# Patient Record
Sex: Female | Born: 1960
Health system: Southern US, Community
[De-identification: ages and names within clinical notes are randomized; demographics above are authoritative.]

## PROBLEM LIST (undated history)

## (undated) DIAGNOSIS — F329 Major depressive disorder, single episode, unspecified: Secondary | ICD-10-CM

## (undated) DIAGNOSIS — F1024 Alcohol dependence with alcohol-induced mood disorder: Secondary | ICD-10-CM

## (undated) DIAGNOSIS — F32A Depression, unspecified: Secondary | ICD-10-CM

## (undated) DIAGNOSIS — R569 Unspecified convulsions: Secondary | ICD-10-CM

## (undated) DIAGNOSIS — I10 Essential (primary) hypertension: Secondary | ICD-10-CM

## (undated) DIAGNOSIS — K219 Gastro-esophageal reflux disease without esophagitis: Secondary | ICD-10-CM

## (undated) HISTORY — DX: Alcohol dependence with alcohol-induced mood disorder: F10.24

## (undated) HISTORY — DX: Gastro-esophageal reflux disease without esophagitis: K21.9

## (undated) HISTORY — PX: BACK SURGERY: SHX140

## (undated) HISTORY — PX: OTHER SURGICAL HISTORY: SHX169

## (undated) HISTORY — PX: TUBAL LIGATION: SHX77

## (undated) HISTORY — PX: ABDOMINAL HYSTERECTOMY: SHX81

## (undated) HISTORY — DX: Essential (primary) hypertension: I10

---

## 1998-11-20 ENCOUNTER — Other Ambulatory Visit: Admission: RE | Admit: 1998-11-20 | Discharge: 1998-11-20 | Payer: Self-pay | Admitting: Family Medicine

## 1999-12-06 ENCOUNTER — Other Ambulatory Visit: Admission: RE | Admit: 1999-12-06 | Discharge: 1999-12-27 | Payer: Self-pay

## 2001-03-06 ENCOUNTER — Other Ambulatory Visit: Admission: RE | Admit: 2001-03-06 | Discharge: 2001-03-06 | Payer: Self-pay | Admitting: Family Medicine

## 2002-04-23 ENCOUNTER — Other Ambulatory Visit: Admission: RE | Admit: 2002-04-23 | Discharge: 2002-04-23 | Payer: Self-pay | Admitting: Family Medicine

## 2006-09-06 ENCOUNTER — Observation Stay (HOSPITAL_COMMUNITY): Admission: EM | Admit: 2006-09-06 | Discharge: 2006-09-07 | Payer: Self-pay | Admitting: Emergency Medicine

## 2006-09-06 ENCOUNTER — Ambulatory Visit: Payer: Self-pay | Admitting: Cardiology

## 2006-09-07 ENCOUNTER — Encounter: Payer: Self-pay | Admitting: Cardiology

## 2006-09-26 ENCOUNTER — Emergency Department (HOSPITAL_COMMUNITY): Admission: EM | Admit: 2006-09-26 | Discharge: 2006-09-26 | Payer: Self-pay | Admitting: Emergency Medicine

## 2006-09-27 ENCOUNTER — Ambulatory Visit: Payer: Self-pay | Admitting: Psychiatry

## 2006-09-27 ENCOUNTER — Inpatient Hospital Stay (HOSPITAL_COMMUNITY): Admission: EM | Admit: 2006-09-27 | Discharge: 2006-10-02 | Payer: Self-pay | Admitting: Psychiatry

## 2006-10-04 ENCOUNTER — Ambulatory Visit (HOSPITAL_COMMUNITY): Payer: Self-pay | Admitting: Psychiatry

## 2006-10-05 ENCOUNTER — Ambulatory Visit (HOSPITAL_COMMUNITY): Payer: Self-pay | Admitting: Psychiatry

## 2006-10-13 ENCOUNTER — Ambulatory Visit (HOSPITAL_COMMUNITY): Payer: Self-pay | Admitting: Psychiatry

## 2006-10-23 ENCOUNTER — Ambulatory Visit (HOSPITAL_COMMUNITY): Payer: Self-pay | Admitting: Psychiatry

## 2006-10-24 ENCOUNTER — Ambulatory Visit (HOSPITAL_COMMUNITY): Payer: Self-pay | Admitting: Psychiatry

## 2006-10-30 ENCOUNTER — Inpatient Hospital Stay (HOSPITAL_COMMUNITY): Admission: RE | Admit: 2006-10-30 | Discharge: 2006-11-06 | Payer: Self-pay | Admitting: Psychiatry

## 2006-10-30 ENCOUNTER — Emergency Department (HOSPITAL_COMMUNITY): Admission: EM | Admit: 2006-10-30 | Discharge: 2006-10-30 | Payer: Self-pay | Admitting: *Deleted

## 2006-11-07 ENCOUNTER — Ambulatory Visit: Payer: Self-pay | Admitting: Psychiatry

## 2006-11-07 ENCOUNTER — Other Ambulatory Visit (HOSPITAL_COMMUNITY): Admission: RE | Admit: 2006-11-07 | Discharge: 2006-11-21 | Payer: Self-pay | Admitting: Psychiatry

## 2006-12-06 ENCOUNTER — Ambulatory Visit (HOSPITAL_COMMUNITY): Payer: Self-pay | Admitting: Psychiatry

## 2006-12-26 ENCOUNTER — Ambulatory Visit (HOSPITAL_COMMUNITY): Payer: Self-pay | Admitting: Psychiatry

## 2007-02-28 ENCOUNTER — Ambulatory Visit (HOSPITAL_COMMUNITY): Payer: Self-pay | Admitting: Psychiatry

## 2007-03-15 ENCOUNTER — Ambulatory Visit (HOSPITAL_COMMUNITY): Payer: Self-pay | Admitting: Psychiatry

## 2007-03-19 ENCOUNTER — Ambulatory Visit (HOSPITAL_COMMUNITY): Payer: Self-pay | Admitting: Psychiatry

## 2007-03-20 ENCOUNTER — Ambulatory Visit (HOSPITAL_COMMUNITY): Payer: Self-pay | Admitting: Psychiatry

## 2007-04-02 ENCOUNTER — Ambulatory Visit (HOSPITAL_COMMUNITY): Payer: Self-pay | Admitting: Psychiatry

## 2007-04-17 ENCOUNTER — Ambulatory Visit (HOSPITAL_COMMUNITY): Payer: Self-pay | Admitting: Psychiatry

## 2007-06-12 ENCOUNTER — Ambulatory Visit (HOSPITAL_COMMUNITY): Payer: Self-pay | Admitting: Psychiatry

## 2007-09-04 ENCOUNTER — Ambulatory Visit (HOSPITAL_COMMUNITY): Payer: Self-pay | Admitting: Psychiatry

## 2007-12-06 ENCOUNTER — Ambulatory Visit (HOSPITAL_COMMUNITY): Payer: Self-pay | Admitting: Psychiatry

## 2008-04-01 ENCOUNTER — Ambulatory Visit (HOSPITAL_COMMUNITY): Payer: Self-pay | Admitting: Psychiatry

## 2008-07-08 ENCOUNTER — Ambulatory Visit (HOSPITAL_COMMUNITY): Payer: Self-pay | Admitting: Psychiatry

## 2008-09-04 ENCOUNTER — Inpatient Hospital Stay (HOSPITAL_COMMUNITY): Admission: RE | Admit: 2008-09-04 | Discharge: 2008-09-07 | Payer: Self-pay | Admitting: *Deleted

## 2008-09-04 ENCOUNTER — Ambulatory Visit (HOSPITAL_COMMUNITY): Payer: Self-pay | Admitting: Psychiatry

## 2008-09-04 ENCOUNTER — Ambulatory Visit: Payer: Self-pay | Admitting: *Deleted

## 2008-09-16 ENCOUNTER — Ambulatory Visit (HOSPITAL_COMMUNITY): Payer: Self-pay | Admitting: Psychiatry

## 2008-10-09 ENCOUNTER — Ambulatory Visit (HOSPITAL_COMMUNITY): Payer: Self-pay | Admitting: Psychiatry

## 2008-11-06 ENCOUNTER — Ambulatory Visit (HOSPITAL_COMMUNITY): Payer: Self-pay | Admitting: Psychiatry

## 2009-01-06 ENCOUNTER — Ambulatory Visit (HOSPITAL_COMMUNITY): Payer: Self-pay | Admitting: Psychiatry

## 2009-03-03 ENCOUNTER — Ambulatory Visit (HOSPITAL_COMMUNITY): Payer: Self-pay | Admitting: Psychiatry

## 2009-04-08 HISTORY — PX: FOOT SURGERY: SHX648

## 2009-05-28 ENCOUNTER — Ambulatory Visit (HOSPITAL_COMMUNITY): Payer: Self-pay | Admitting: Psychiatry

## 2009-09-15 ENCOUNTER — Ambulatory Visit (HOSPITAL_COMMUNITY): Payer: Self-pay | Admitting: Psychiatry

## 2009-12-08 ENCOUNTER — Ambulatory Visit (HOSPITAL_COMMUNITY): Payer: Self-pay | Admitting: Psychiatry

## 2010-03-23 ENCOUNTER — Ambulatory Visit (HOSPITAL_COMMUNITY): Payer: Self-pay | Admitting: Psychiatry

## 2010-04-22 ENCOUNTER — Ambulatory Visit (HOSPITAL_COMMUNITY): Payer: Self-pay | Admitting: Psychiatry

## 2010-07-22 ENCOUNTER — Ambulatory Visit (HOSPITAL_COMMUNITY): Payer: Self-pay | Admitting: Psychiatry

## 2010-10-19 ENCOUNTER — Encounter (HOSPITAL_COMMUNITY): Payer: Self-pay | Admitting: Psychiatry

## 2010-10-28 ENCOUNTER — Encounter (INDEPENDENT_AMBULATORY_CARE_PROVIDER_SITE_OTHER): Payer: BC Managed Care – PPO | Admitting: Psychiatry

## 2010-10-28 DIAGNOSIS — F331 Major depressive disorder, recurrent, moderate: Secondary | ICD-10-CM

## 2010-11-22 LAB — URINALYSIS, ROUTINE W REFLEX MICROSCOPIC
Glucose, UA: NEGATIVE mg/dL
Leukocytes, UA: NEGATIVE
Protein, ur: NEGATIVE mg/dL
Specific Gravity, Urine: 1.031 — ABNORMAL HIGH (ref 1.005–1.030)
Urobilinogen, UA: 1 mg/dL (ref 0.0–1.0)

## 2010-11-22 LAB — BENZODIAZEPINE, QUANTITATIVE, URINE
Alprazolam (GC/LC/MS), ur confirm: NEGATIVE
Flurazepam GC/MS Conf: NEGATIVE
Nordiazepam GC/MS Conf: NEGATIVE
Oxazepam GC/MS Conf: NEGATIVE

## 2010-11-22 LAB — CBC
HCT: 41.1 % (ref 36.0–46.0)
MCHC: 33.1 g/dL (ref 30.0–36.0)
MCV: 81 fL (ref 78.0–100.0)
RDW: 15.6 % — ABNORMAL HIGH (ref 11.5–15.5)

## 2010-11-22 LAB — URINE MICROSCOPIC-ADD ON

## 2010-11-22 LAB — COMPREHENSIVE METABOLIC PANEL
Albumin: 4 g/dL (ref 3.5–5.2)
BUN: 17 mg/dL (ref 6–23)
Calcium: 10 mg/dL (ref 8.4–10.5)
Creatinine, Ser: 0.73 mg/dL (ref 0.4–1.2)
GFR calc Af Amer: >60 mL/min (ref >60)
Total Protein: 7.4 g/dL (ref 6.0–8.3)

## 2010-11-22 LAB — URINE DRUGS OF ABUSE SCREEN W ALC, ROUTINE (REF LAB)
Amphetamine Screen, Ur: NEGATIVE
Benzodiazepines.: POSITIVE — AB
Creatinine,U: 235.4 mg/dL
Ethyl Alcohol: <10 mg/dL (ref <10)
Propoxyphene: NEGATIVE

## 2010-11-22 LAB — TSH: TSH: 5.562 u[IU]/mL — ABNORMAL HIGH (ref 0.350–4.500)

## 2010-12-21 NOTE — Discharge Summary (Signed)
NAMECLAIRA, [name]                  ACCOUNT NO.:  [id number]   MEDICAL RECORD NO.:  [id number]          PATIENT TYPE:  IPS   LOCATION:  0300                          FACILITY:  [facility]   PHYSICIAN:  Jasmine Pang, M.D. DATE OF BIRTH:  15-Mar-1961   DATE OF ADMISSION:  09/04/2008  DATE OF DISCHARGE:  09/07/2008                               DISCHARGE SUMMARY   IDENTIFICATION:  This is a 50 year old married white female from  [place], [place] who was referred on a voluntary basis by Dr.  Lolly Mustache, her outpatient psychiatrist on September 04, 2008.   HISTORY OF PRESENT ILLNESS:  The patient states she began out crying  spells.  She has been feeling very depressed and empty if you were not  here, you would not have these problems.  She talked with the EAP at  work.  She was on Paxil, but was switched to Zoloft.  She did not feel  this helped.  Dr. Lolly Mustache suggested Celexa, but felt she needed to come  into the hospital to make this change.  Stressors include husband does  not understand my depression.  Another stressor, she keeps her  granddaughter and is sending her son to college.  The patient states she  was here 2 years ago.   PAST PSYCHIATRIC HISTORY:  See HPI.   ALCOHOL AND DRUG HISTORY:  No alcohol or drug use.   MEDICAL PROBLEMS:  No known medical conditions.   MEDICATIONS:  Klonopin, trazodone, Paxil, and Risperdal.   ALLERGIES:  ? To EAR DROPS (itchy crusty).   PHYSICAL FINDINGS:  There were no acute physical or medical problems  noted.  Her physical exam was done by our nurse practitioner.   ADMISSION LABORATORIES:  Urinalysis was negative.  TSH was 5.562.  RDW  was 15.6.  C-MET was within normal limits.   HOSPITAL COURSE:  Upon admission, the patient was started on Klonopin  0.5 mg 1-2 tablets b.i.d. p.r.n. anxiety and trazodone 50 mg p.o.  q.h.s., Paxil 60 mg daily, Risperdal 0.5 mg q.a.m. and Risperdal 1 mg  q.h.s.  On September 05, 2008, Paxil was decreased at 40  mg p.o. q.h.s. x2  days and 20 mg a day x2 days and discontinue Paxil.  Celexa 10 mg daily  for 2 days was started and Celexa 20 mg p.o. daily.  The clonazepam was  discontinued.  She was started on lorazepam 1 mg p.o. q.i.d. p.r.n.  anxiety.  In individual sessions with me, the patient was cooperative  with fair eye contact.  She did have psychomotor retardation.  Speech  was normal rate and flow.  Mood was depressed and anxious with a  consistent affect. She was quite tearful.  Anxiety level was rated at  severe and there was no evidence of psychosis or thought disorder.  On  September 05, 2008, the patient had a family session via telephone with  her husband.  Main concerns addressed were for discharge planning and  discussing the patient's medications.  Both stated that she had needed  to get her medications adjusted.  The patient  will return home and  return to her therapist, Tacy Learn at her EAP program.  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  She  identified babysitting as stressors and spoke about decreasing this as  she could have time for self on the days that she does not work.  On  September 06, 2008, the patient was not anxious since medications were  changed and was sleeping better.  She was not crying like she was when  admitted.  There was no change in her treatment plan.  On September 07, 2008, mental status had improved markedly.  The patient requested  discharge as her husband was coming for lunch she wanted to return with  him.  She has a followup appointment with Dr. Lolly Mustache and her counselor.  Mood was less depressed, less anxious.  Affect was consistent with mood.  There was no suicidal or homicidal ideation.  No thoughts of self  injurious behavior.  No auditory or visual hallucinations.  No paranoia  or delusions.  Thoughts were logical and goal-directed.  Thought content  no predominant theme.  Cognitive grossly intact.  Insight  good.  Judgment good.  Impulse control good. Psychomotor activity was within  normal limits.   DISCHARGE DIAGNOSES:  Axis I:  Mood disorder, not otherwise specified.  Axis II:  None.  Axis III:  None.  Axis IV:  Severe (son in college, keeping granddaughter, feeling like  her husband does not understand her depression, burden of psychiatric  illness).  Axis V:  Global assessment of functioning is 50 upon discharge.  GAF was  35 upon admission.  GAF highest past year was 65.   DISCHARGE PLANS:  There was no specific activity level or dietary  restrictions.   POSTHOSPITAL CARE PLANS:  The patient will see Dr. Lolly Mustache on September 16, 2008, at 10:30 a.m.  She will also see Florencia Reasons, counselor on  September 17, 2008 at 11 a.m.   DISCHARGE MEDICATIONS:  1. Celexa 20 mg h.s.  2. Risperdal 0.5 mg in the a.m. and 1 mg at bedtime.  3. Lorazepam 1 mg q.i.d. p.r.n. anxiety.      Jasmine Pang, M.D.  Electronically Signed     BHS/MEDQ  D:  09/29/2008  T:  09/30/2008  Job:  [id number]

## 2010-12-24 NOTE — Discharge Summary (Signed)
[name], [name]                  ACCOUNT NO.:  [id number]   MEDICAL RECORD NO.:  [id number]          PATIENT TYPE:  IPS   LOCATION:  0302                          FACILITY:  [facility]   PHYSICIAN:  Anselm Jungling, MD  DATE OF BIRTH:  [birth year]   DATE OF ADMISSION:  10/30/2006  DATE OF DISCHARGE:  11/06/2006                               DISCHARGE SUMMARY   IDENTIFYING DATA/REASON FOR ADMISSION:  This was the second [facility]  admission for [name], a 50 year old married female who was readmitted one  month after her prior course of treatment here.  She was admitted after  getting drunk on alcohol, having an argument with her husband, and then  taking an excessive quantity of Ambien.  She returned to [place] on a regimen  of Paxil and Klonopin.  She had also made superficial lacerations.  Please refer to the admission note for further details pertaining to the  symptoms, circumstances and history that led to her hospitalization.  Please also refer to previous admission and discharge notes.   INITIAL DIAGNOSTIC IMPRESSION:  She was given initial AXIS I diagnosis  of mood disorder not otherwise specified, and alcohol abuse, with  secondary diagnosis of AXIS II features and marital problem.   MEDICAL/LABORATORY:  The patient was medically and physically assessed  by the psychiatric nurse practitioner.  She was continued on Lopressor  12.5 mg b.i.d. for hypertension.  In addition, she was treated with  Cipro ear drops for otitis.  Otherwise, there were no acute medical  issues.   HOSPITAL COURSE:  The patient was admitted again to the inpatient  psychiatric service.  She presented as a well-nourished, normally-  developed woman who was alert and fully oriented.  She was very sad and  tearful.  There were no signs or symptoms of psychosis.  She discussed  her marital stresses.  She indicated that she was in acceptance of being  readmitted to the hospital, but otherwise appeared to be upset and  overwhelmed, so much so that she could not formulate clear ideas  regarding her treatment needs.   The patient was treated with a psychotropic regimen including Paxil 40  mg daily, Klonopin 0.5 mg q.h.s., Risperdal 0.5 mg t.i.d.  She was again  involved in various therapeutic groups and activities and was a good  participant in the treatment program.   On the third hospital day, the patient continued very depressed and  anxious, hopeless and helpless.  She was not eating very much and  sleeping poorly.  She had had contact with her husband on the telephone  the day prior and stated that it went poorly.  She was encouraged to eat  and drink, lest she become dehydrated.   The day following, she continued depressed, apathetic, and helpless in  outlook.  She stated that she was alright but looked very glum.  She  had been prescribed Klonopin, and this was discontinued in the morning  to help increase her level of daytime energy.   On the fifth hospital day, the patient continued depressed, and  hopeless.  She believed her husband would be moving out of the family  home prior to her discharge and return home.  She was unable to talk  about this, and when she attempted to do so became overcome with tears  and sobbing.  She stated that her husband had refused to visit her  because he doesn't want to give me any false hope.   In an effort to marshal all supports available, we scheduled a family  meeting involving the patient's mother.  Mother came for a family  session, and the patient's immediate and aftercare needs were discussed  at length.  There was discussion about needing time off from work, so  that she could take short-term disability.  There was also discussion  about the possibility of the patient attending the intensive outpatient  program and/or getting counseling through her employee assistance  program at work.  The patient and her mother were given information on  the crisis  hotline, and the suicide prevention pamphlet.   We discussed a change away from Paxil, because the patient had been on  it for five weeks without any apparent benefit.  She continued to report  obsessional thinking and worry, and thoughts of self-harm, that the  patient disavowed, but could not get out of her mind anyway.  Paxil was  discontinued in favor of a trial of Risperdal 0.25 mg t.i.d. and 0.5 mg  q.h.s.  She remained depressed following this, but continued less  anxious and obsessional.   By the seventh hospital day, the patient was reporting that she was less  anxious, sleeping better, and less obsessional.  She was agreeing to the  intensive outpatient program.  Risperdal was increased to 0.5 mg t.i.d.   On the final hospital day, the patient reported that she was feeling  significantly better, and ready to continue her care in the outpatient  setting.   AFTERCARE:  The patient was to follow up with Dr. Lolly Mustache in the  [facility] on November 14, 2006, and for individual counseling with  Florencia Reasons on November 08, 2006.   DISCHARGE MEDICATIONS:  1. Paxil 40 mg daily.  2. Nexium 40 mg daily.  3. Klonopin 0.5 mg q.h.s.  4. Risperdal 0.5 mg t.i.d.  5. Cipro ear drops daily.  6. Lopressor 12.5 mg b.i.d.   DISCHARGE DIAGNOSES:  AXIS I:  Major depressive disorder.  Marital  problem.  AXIS II:  Deferred.  AXIS III:  Otitis, gastroesophageal reflux disease and hypertension.  AXIS IV:  Stressors:  Severe.  AXIS V:  GAF on discharge 55.      Anselm Jungling, MD  Electronically Signed     SPB/MEDQ  D:  11/15/2006  T:  11/15/2006  Job:  [id number]

## 2010-12-24 NOTE — Consult Note (Signed)
[name], [name]                  ACCOUNT NO.:  [id number]   MEDICAL RECORD NO.:  [id number]          PATIENT TYPE:  OBV   LOCATION:  4732                         FACILITY:  [facility]   PHYSICIAN:  Kristine Garbe. Ezzard Standing, M.D.DATE OF BIRTH:  1960/12/01   DATE OF CONSULTATION:  09/06/2006  DATE OF DISCHARGE:                                 CONSULTATION   REASON FOR CONSULTATION:  Evaluate patient with right ear problems.   BRIEF HISTORY AND PHYSICAL:  The patient is a 50 year old female who is  presently admitted to [facility] because of a recent bout of  tachycardia, presyncope, and elevated blood pressure.  She has  complained of some fullness in the head as well as fullness in the right  ear.  She has a history of chronic right ear problems.  Had a right T-  tube placed over two years ago.   On examination of the ear today, there is some drainage around the right  tube.  The left TM is clear.   IMPRESSION:  History of chronic right ear problems with right ear  infection.   RECOMMENDATIONS:  Recommended placing her on Ciprodex Otic Ear Drops 4  drops twice a day for the next five days.  Would like to have her follow  up in my office when she is discharged from the hospital for further  evaluation.  Will have her call my office for a followup appointment  when discharged from the hospital.           ______________________________  Kristine Garbe. Ezzard Standing, M.D.     CEN/MEDQ  D:  09/06/2006  T:  09/06/2006  Job:  [id number]

## 2010-12-24 NOTE — H&P (Signed)
NAMESHARITA, [name] NO.:  [id number]   MEDICAL RECORD NO.:  [id number]          PATIENT TYPE:  EMS   LOCATION:  MAJO                         FACILITY:  [facility]   PHYSICIAN:  Carolyn Abed, MD, FACCDATE OF BIRTH:  [birth year]   DATE OF ADMISSION:  09/06/2006  DATE OF DISCHARGE:                              HISTORY & PHYSICAL   SUMMARY OF HISTORY:  This is a 50 year old white female who was seen by  her primary care physician's assistant, [name] and was referred to  [facility] secondary to near syncope and sinus tachycardia.   The patient states that over the last week or two she has felt  intermittent nausea and just has not felt well.  She states in the last  couple days she has noticed when she becomes nauseated she gets dizzy  and feels like she is going to pass out.  She states that she actually  vomited twice yesterday and her symptoms seem to be worse yesterday and  this morning, thus prompting her evaluation.  She also describes a head  fullness where everything seems to be in a fog.  Decreased hearing as  like she is in a swimming pool under water and everything seems somewhat  muffled.  She denies any nystagmus or falls, actual loss of  consciousness or objects spinning.  She states that she has noticed that  her heartbeat has been somewhat faster than usual and describes a  fluttering sensation.  She states that usually if she checks her  heartbeat it is in the upper 90s to low 100s.  She denies any problems  with chest discomfort, shortness of breath, dyspnea on exertion,  orthopnea, PND or edema.  She has had a slight cough that she does not  consider unusual and has not been productive.  Her husband relates  chills however the patient denies any recent fevers.   When seen by Mr. [name] this morning she was told that she has a right ear  infection however she was not placed on any antibiotics.  She also  states that she is not taking any  herbal medications, over-the-counter  antihistamines and has not missed her home medications.   PAST MEDICAL HISTORY:  Allergies include CORTISPORIN.  Medications prior  to admission include Zoloft 100 mg daily, metoprolol 25 mg b.i.d.,  Excedrin ES p.r.n. and Nexium 40 mg daily.  She has a history of  hypertension, fibromyalgia and anemia.  She states that she has had a  right ear tube placed and a T&A.  She denies diabetes, myocardial  infarction, CVA, COPD, bleeding dyscrasias, thyroid disorder.  She does  not know her cholesterol status.   SOCIAL HISTORY:  She resides in [place] with her husband.  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  She has two children.  No  grandchildren.  She denies tobacco, alcohol, drugs, herbal medication.  She tries to maintain low carb diet.  She rarely exercises however, when  she chooses to she walks without difficulty.   FAMILY HISTORY:  Mother is alive  at the age of 44 with a history of  coronary stenting and hypertension.  Father died at the age of 33 with  myocardial infarction post a bad MVA  He had a history of diabetes.  She does not have any brothers or sisters.   REVIEW OF SYSTEMS:  In addition to above is notable for right hearing  loss, glasses, postmenopausal.  Right hand numbness.  All other systems  are unremarkable.   PHYSICAL EXAM:  VITAL SIGNS:  Temperature 98.6, blood pressure 154/100, pulse 128 and  regular, respirations 22 and regular, 99% sat on 2 liters.  GENERAL:  Well-nourished, well-developed pleasant white female in no  apparent distress.  Husband is present.  HEENT: Normocephalic.  PERRLA.  Extraocular movements intact.  Sclerae  is clear.  Her right ear canal is erythematous with significant  drainage.  Left ear is slightly erythematous.  Right ear is painful  during exam.  NECK:  Supple without thyromegaly, adenopathy, JVD or carotid bruits.  CHEST:  Symmetrical excursion.  LUNGS:  Lungs were clear to  auscultation.  HEART:  PMI is not displaced.  Rapid regular rate and rhythm with normal  S1-S2.  I do not appreciate any murmurs, rubs, clicks or gallops.  All  pulses are symmetrical and intact without abdominal or femoral bruits.  SKIN AND INTEGUMENT:  Intact.  Does not appear to have any rashes.  She  does have a sore on her right forehead that she meant to ask Mr. [name]  about.  ABDOMEN:  Slightly rounded.  Bowel sounds present without organomegaly,  masses or tenderness.  EXTREMITIES:  Negative cyanosis, clubbing or edema.  Peripheral pulses  are symmetrical and intact.  MUSCULOSKELETAL:  Unremarkable.  NEUROLOGIC:  Alert and oriented x3.  Cranial nerves II-XII are grossly  intact.  She has equal strength in all extremities.   EKG in the emergency room is comparable to the EKG from primary care's  office.  This revealed sinus tachycardia, normal intervals, normal axis,  slightly delayed R-wave.  Essentially is not change from EKG in 2003  except the rate is faster.  Labs are pending.  Chest x-ray is pending.   IMPRESSION:  1. Near-syncope.  2. Chronic ear issues with possible acute right ear infection      precipitating symptoms of #1.  3. Hypertension.  4. Sinus tachycardia   DISPOSITION:  Dr. Myrtis Carrillo reviewed the patient's history, spoke with family  and patient and agrees with the above.  Given that she had a TSH on  August 29 2006 and this is within normal limits, this will not be  repeated.  We will check a 2-D echocardiogram to assess for  cardiomyopathy.  We will increase her beta blocker to 50 mg p.o. b.i.d.  I have consulted Dr. Ezzard Carrillo in regards to her above symptoms as that  they are probably related to her inner ear.  We will observe her  overnight.  Further disposition will follow post-treatment.      Carolyn Rued, PA-C      Carolyn Abed, MD, [facility]  Electronically Signed   EW/MEDQ  D:  09/06/2006  T:  09/06/2006  Job:  [id number]   cc:   [name].  [name], M.D.  [name]. [name], M.D.

## 2010-12-24 NOTE — H&P (Signed)
NAMEJOSUE, KASS                  ACCOUNT NO.:  [id number]   MEDICAL RECORD NO.:  [id number]          PATIENT TYPE:  IPS   LOCATION:  0300                          FACILITY:  [facility]   PHYSICIAN:  Anselm Jungling, MD  DATE OF BIRTH:  1961/01/29   DATE OF ADMISSION:  09/27/2006  DATE OF DISCHARGE:                       PSYCHIATRIC ADMISSION ASSESSMENT   HISTORY OF PRESENT ILLNESS:  The patient presents with depression.  Her  husband has asked her for a divorce.  They have been married for 25  years.  She is not sure quite what is going on.  She states her husband  keeps bringing up situations that happened 25 years ago.  The patient  states that she scratched her abdomen with a piece of glass to punish  herself.  Also reports overdose on 6 Zoloft tablets over the past  couple days, but vomited up the medicine.  Her husband became very upset  with her with this overdose.  She reports she has lost 40 pounds  intentionally, but she states her husband tells her still that she is  not eating right, that there could be some concerns about her gaining  weight again.   PAST PSYCHIATRIC HISTORY:  This is this patient's first Behavioral  Health admission.  No other psychiatric admissions, nor current  outpatient treatment.  She is a 49 year old married white female,  married for 25 years.  Has 2 children, ages 72 and 45.  She lives with  her husband and her 65 year old son.  Works at [employer].  She has no legal  problems.   FAMILY HISTORY:  Denies alcohol or drug history.   SOCIAL HISTORY:  Nonsmoker.  Drinks alcohol on an occasional basis.  No  drug use.   PRIMARY CARE Roderica Cathell:  Dr. Lindaann Pascal in [place], [place].   MEDICAL PROBLEMS:  Hypertension and GERD.   MEDICATIONS:  1. She has been on metoprolol 50 mg one b.i.d.  2. Nexium 40 mg daily.   DRUG ALLERGIES:  Reports an allergy to ear drops.   PHYSICAL EXAMINATION:  The patient was assessed at [facility] Emergency  Department.  Her temperature is 97.2, 94 heart rate, 18 respirations.  Blood pressure is 130/85.  Again, she was assessed at [facility]  Emergency Department, where she received potassium 40 mEq as the patient  had a potassium of 2.9.  Also received some IV piggyback potassium and  received some IV fluids.  Her laboratory data, CBC, is within normal  limits.  Again, initial potassium was 2.9, is now 3.8.  Urine drug  screen is negative.  Her alcohol level is less than 5.   MENTAL STATUS EXAMINATION:  She is fully alert, cooperative, little eye  contact.  She is casually dressed.  Her speech is clear.  The patient's  mood is hopeless.  The patient is cheerful throughout most of the  interview.  Thought processes, answers are goal directed.  Focuses on  situation with herself and her husband.  The patient relates different  scenarios about their marriage.  Cognitively intact.  Memory is good.  Judgment is fair.  Insight is fair.  Concentration is intact.  Average  intelligence.   Axis I:  Major depressive disorder, severe.  Axis II:  Deferred.  Axis III:  Hypertension and gastroesophageal reflux disease.  Axis IV:  Problems with primary support group and other psychosocial  problems.  Axis V:  Current is 35.   PLAN:  Is to contract for safety.  __________ .  We will initiate Paxil.  Risks and benefits were discussed.  Will have some Ativan available for  patient's symptoms of anxiety.  The patient may need some individual  therapy.  Will have a family session with husband.  Will also have  Ambien or trazodone available for sleep.  The patient is to increase her  coping skills of 10 groups.  __________  4 to 5 days.      Landry Corporal, N.P.      Anselm Jungling, MD  Electronically Signed    JO/MEDQ  D:  09/28/2006  T:  09/28/2006  Job:  [id number]

## 2010-12-24 NOTE — Discharge Summary (Signed)
[name], [name]                  ACCOUNT NO.:  [id number]   MEDICAL RECORD NO.:  [id number]          PATIENT TYPE:  OBV   LOCATION:  4732                         FACILITY:  [facility]   PHYSICIAN:  Luis Abed, MD, FACCDATE OF BIRTH:  1961/01/30   DATE OF ADMISSION:  09/06/2006  DATE OF DISCHARGE:  09/07/2006                               DISCHARGE SUMMARY   PROCEDURE PERFORMED DURING HOSPITALIZATION:  None.   HISTORY OF PRESENT ILLNESS:  This is a pleasant, 50 year old Caucasian  female who was admitted after being seen by her primary care Tayshon Winker,  Lindaann Pascal, [place], with patient complaints of syncope and tachycardia.  The  patient stated that, over the last week or two, she felt intermittent  nausea and had not felt well.  She had been feeling some nausea with  dizzy spells and felt like she was going to pass out.  The patient  states that she had been vomiting prior to admission.  She also noticed  fullness in her head with a foggy feeling and decreased hearing on the  right side.   By the examination by Mr. Jacqulyn Bath, she was told that she had right ear  infection, but she was not placed on antibiotics.  Secondary to the  increased symptoms, the patient was came to the emergency room for  further evaluation and treatment.   HOSPITAL COURSE:  The patient was seen and examined by Dr. Myrtis Ser and also  by Joellyn Rued PA.  The patient was admitted for further evaluation  secondary to the near syncope, dizziness, and tachycardia.  The patient  was evaluated with lab work along with EKG.  EKG, in the emergency room,  was found to be unchanged from the EKG in the primary care's office,  revealing sinus tachycardia, normal intervals, normal axes with slightly  delayed R-wave.  The patient had cardiac enzymes cycled and they were  found to be negative with a troponin of 0.01, 0.03, and 0.02  respectively.   The patient had an echocardiogram completed during hospitalization.  She  was found  to have a left ventricular ejection fraction range being 65%.  There was no ventricular wall motion abnormalities.  Overall, left  ventricular systolic function was normal.  There were no valvular  abnormalities.  It was noted that the left ventricular wall thickness  was mildly decreased per Dr. Jens Som, who read this on September 07, 2006.   Dr. Narda Bonds, ENT specialist, was consulted secondary to the  patient's complaint of right ear pain and possible infection.  Dr.  Ezzard Standing did assess the patient and she was found to have an infected  right ear.  She was started on Cipro DX otic drops, 4 drops in each ear  twice a day.  Dr. Ezzard Standing also requested a followup in his office after  the patient had been discharged.  The following day, on discharge, the  patient was felling much better.  The dizziness had subsided.  Her heart  rate remained within normal limits.  She continued on her Lopressor as  at home at 16  mg b.i.d.  The patient was feeling better and was seen and  examined by Dr. Antoine Poche and found stable for discharge.  It was noted  that Dr. Antoine Poche had recommended that a T3 and a T4 be drawn prior to  discharge with results to be discussed by Dr. Myrtis Ser on followup  appointment.  It is noted that her TSH was found to be 0.995 during this  admission.   DISCHARGE LABORATORY DATA:  Hemoglobin 14.4, hematocrit 43, white blood  cells 8, platelets 318,000.  PT 12.7, INR 0.9, PTT 24.  Sodium 148,  potassium 4.1, chloride 103, CO2 25, glucose 105, BUN 14, creatinine  0.79.  LFTs within normal limits.  Cholesterol 168, triglycerides 64,  HDL 58, LDL 97.  The patient's chest x-ray, dated September 06, 2006,  revealed no acute cardiopulmonary findings.  EKG, on the day of  discharge, revealed a normal sinus rhythm with ventricular rate at 78  beats per minute.   DISCHARGE PLANS AND APPOINTMENTS:  1. The patient will see Dr. Zackery Barefoot on November 06, 2006 at 9:30      a.m. for a  cardiology followup.  2. The patient will follow up with Dr. Narda Bonds on Monday,      September 11, 2006, for evaluation of right ear infection.  3. The patient will follow with her primary care Bronsen Serano, Lindaann Pascal,      physician assistant, and she is to call to make that appointment.  4. The patient is to ask Dr. Myrtis Ser the results of her T3 and T4      results, which were drawn prior to discharge.  5. The patient is to continue her current medications as taking at      home with the addition of ciprofloxacin ear drops with prescription      provided.   DISCHARGE MEDICATIONS:  1. Zoloft 100 mg once a day.  2. Lopressor 50 mg twice a day.  3. Ciprofloxacin ear drops, 4 drops to right ear twice a day x5 days.   ALLERGIES:  Neomycin and polymyxin B sulfate.   TIME SPENT WITH THE PATIENT:  To include physician time, 30 minutes.      Bettey Mare. Lyman Bishop, NP      Luis Abed, MD, [facility]  Electronically Signed    KML/MEDQ  D:  09/07/2006  T:  09/08/2006  Job:  [id number]   cc:   Lindaann Pascal, PA  Kristine Garbe Ezzard Standing, M.D.

## 2010-12-24 NOTE — Discharge Summary (Signed)
NAMERAYMOND, AZURE                  ACCOUNT NO.:  [id number]   MEDICAL RECORD NO.:  [id number]          PATIENT TYPE:  IPS   LOCATION:  0300                          [facility]   PHYSICIAN:  Anselm Jungling, MD  DATE OF BIRTH:  26-Mar-1961   DATE OF ADMISSION:  09/27/2006  DATE OF DISCHARGE:  10/02/2006                               DISCHARGE SUMMARY   IDENTIFYING DATA AND REASON FOR ADMISSION:  The patient is a 50 year old  married white female admitted due to increasing depression and suicidal  ideation.  Her husband had mentioned that he wanted a divorce, after 25  years of marriage.  In response to this, the patient made a suicide  gesture via overdose, and also cut herself.  She had been taking the  antidepressant Zoloft.  Please refer to the admission note for further  details pertaining to the symptoms, circumstances and history that led  to her hospitalization.  She was given an initial Axis I diagnosis of  major depressive disorder, recurrent, adjustment disorder with depressed  mood and marital problem.   MEDICAL AND LABORATORY:  The patient was medically and physically  assessed by the psychiatric nurse practitioner.  She came to [place] with a  history of hypertension and GERD.  She was continued on Lopressor 50 mg  daily and given Protonix 80 mg daily.  There were no significant medical  issues.   HOSPITAL COURSE:  The patient was admitted to the adult inpatient  psychiatric service.  She presented as a well-nourished, well-developed  woman who initially was very tired, depressed and sad, but whom denied  any active further suicidal ideation.  There were no signs or symptoms  of psychosis or thought disorder.  The patient denied drug and alcohol  abuse.  She verbalized a strong desire for help.   It was judged that Zoloft had not been an effective antidepressant for  her.  As such, it was discontinued in favor of a trial of Paxil 10 mg  daily.  Ativan was used on a  p.r.n. basis for anxiety, and later changed  to Vistaril p.r.n. anxiety with good results.   Klonopin was also used in low doses for anxiety but appeared to be  excessively sedating and was not continued.   On the fourth hospital day there was a family session involving the  patient and her husband.  Main concerns addressed were on coping skills  that the patient could use to help reduce stress at work, leave the  stresses at work, and ways to increase communication with her husband.  The patient had a referral for individual counseling and medication  management upon discharge and agreed to follow through with this.  She  also agreed to consider going to support groups for depression.  The  patient spoke about wanting to go home on that day on the husband stated  that he felt that she was getting better.  The patient spoke about a  concern with her medications, stating that she felt that they were not  strong enough.  The patient and  her husband were given information on  suicide prevention as well as the suicide prevention pamphlet and the  Crisis Hotline numbers card.   The following day, the patient reported feeling distraught and  overwhelmed.  She learned that her husband might need gallbladder  surgery and wanted to be discharged so she could be with him.   The patient was discharged on the following day, after the undersigned  had an opportunity to meet with her and determined her suitability for  discharge.  She appeared to be quite concerned about her husband but  otherwise less depressed, and absent suicidal ideation.  She was  tolerating her medications.  She agreed to the following aftercare plan.   AFTERCARE:  The patient was to follow up with Dr. Lolly Mustache for medication  management on April 10.  She was to follow up with Florencia Reasons at the  [facility] for individual psychotherapy on February 27.   DISCHARGE MEDICATIONS:  1. Paxil 10 mg daily.  2. Vistaril 50 mg  p.r.n. anxiety up to twice daily.  3. Lopressor 50 mg daily.  4. Protonix 80 mg daily.   DISCHARGE DIAGNOSES:  AXIS I:  Major depressive disorder, recurrent,  severe without psychotic features.  AXIS II:  Deferred.  AXIS III:  History of hypertension, gastroesophageal reflux disease .  AXIS IV:  Stressors severe.  AXIS V:  Global assessment of functioning on discharge 65.      Anselm Jungling, MD  Electronically Signed     SPB/MEDQ  D:  10/03/2006  T:  10/03/2006  Job:  [id number]

## 2011-01-25 ENCOUNTER — Encounter (HOSPITAL_COMMUNITY): Payer: BC Managed Care – PPO | Admitting: Psychiatry

## 2011-02-03 ENCOUNTER — Encounter (INDEPENDENT_AMBULATORY_CARE_PROVIDER_SITE_OTHER): Payer: BC Managed Care – PPO | Admitting: Psychiatry

## 2011-02-03 DIAGNOSIS — F331 Major depressive disorder, recurrent, moderate: Secondary | ICD-10-CM

## 2011-03-08 ENCOUNTER — Encounter (HOSPITAL_COMMUNITY): Payer: BC Managed Care – PPO | Admitting: Psychiatry

## 2011-03-08 ENCOUNTER — Encounter (INDEPENDENT_AMBULATORY_CARE_PROVIDER_SITE_OTHER): Payer: BC Managed Care – PPO | Admitting: Psychiatry

## 2011-03-08 DIAGNOSIS — F331 Major depressive disorder, recurrent, moderate: Secondary | ICD-10-CM

## 2011-03-17 ENCOUNTER — Ambulatory Visit (INDEPENDENT_AMBULATORY_CARE_PROVIDER_SITE_OTHER): Payer: BC Managed Care – PPO | Admitting: Psychiatry

## 2011-03-17 ENCOUNTER — Encounter (HOSPITAL_COMMUNITY): Payer: BC Managed Care – PPO | Admitting: Psychiatry

## 2011-03-17 DIAGNOSIS — F331 Major depressive disorder, recurrent, moderate: Secondary | ICD-10-CM

## 2011-03-22 ENCOUNTER — Encounter (INDEPENDENT_AMBULATORY_CARE_PROVIDER_SITE_OTHER): Payer: BC Managed Care – PPO | Admitting: Psychiatry

## 2011-03-22 ENCOUNTER — Encounter (HOSPITAL_COMMUNITY): Payer: BC Managed Care – PPO | Admitting: Psychiatry

## 2011-03-22 DIAGNOSIS — F331 Major depressive disorder, recurrent, moderate: Secondary | ICD-10-CM

## 2011-03-31 ENCOUNTER — Encounter (HOSPITAL_COMMUNITY): Payer: BC Managed Care – PPO | Admitting: Psychiatry

## 2011-04-28 ENCOUNTER — Encounter (INDEPENDENT_AMBULATORY_CARE_PROVIDER_SITE_OTHER): Payer: BC Managed Care – PPO | Admitting: Psychiatry

## 2011-04-28 DIAGNOSIS — F331 Major depressive disorder, recurrent, moderate: Secondary | ICD-10-CM

## 2011-05-04 ENCOUNTER — Ambulatory Visit: Payer: BC Managed Care – PPO | Attending: Orthopedic Surgery | Admitting: Physical Therapy

## 2011-05-04 DIAGNOSIS — M25669 Stiffness of unspecified knee, not elsewhere classified: Secondary | ICD-10-CM | POA: Insufficient documentation

## 2011-05-04 DIAGNOSIS — R5381 Other malaise: Secondary | ICD-10-CM | POA: Insufficient documentation

## 2011-05-04 DIAGNOSIS — IMO0001 Reserved for inherently not codable concepts without codable children: Secondary | ICD-10-CM | POA: Insufficient documentation

## 2011-05-04 DIAGNOSIS — M25569 Pain in unspecified knee: Secondary | ICD-10-CM | POA: Insufficient documentation

## 2011-05-06 ENCOUNTER — Ambulatory Visit: Payer: BC Managed Care – PPO | Admitting: Physical Therapy

## 2011-05-10 ENCOUNTER — Ambulatory Visit: Payer: BC Managed Care – PPO | Attending: Orthopedic Surgery | Admitting: Physical Therapy

## 2011-05-10 DIAGNOSIS — R5381 Other malaise: Secondary | ICD-10-CM | POA: Insufficient documentation

## 2011-05-10 DIAGNOSIS — M25669 Stiffness of unspecified knee, not elsewhere classified: Secondary | ICD-10-CM | POA: Insufficient documentation

## 2011-05-10 DIAGNOSIS — IMO0001 Reserved for inherently not codable concepts without codable children: Secondary | ICD-10-CM | POA: Insufficient documentation

## 2011-05-10 DIAGNOSIS — M25569 Pain in unspecified knee: Secondary | ICD-10-CM | POA: Insufficient documentation

## 2011-05-13 ENCOUNTER — Ambulatory Visit: Payer: BC Managed Care – PPO | Admitting: Physical Therapy

## 2011-05-16 ENCOUNTER — Ambulatory Visit: Payer: BC Managed Care – PPO | Admitting: Physical Therapy

## 2011-05-19 ENCOUNTER — Encounter: Payer: BC Managed Care – PPO | Admitting: Physical Therapy

## 2011-05-23 ENCOUNTER — Emergency Department (HOSPITAL_COMMUNITY)
Admission: EM | Admit: 2011-05-23 | Discharge: 2011-05-23 | Disposition: A | Discharge status: Home / Self Care | Payer: BC Managed Care – PPO | Attending: Emergency Medicine | Admitting: Emergency Medicine

## 2011-05-23 ENCOUNTER — Emergency Department (HOSPITAL_COMMUNITY): Payer: BC Managed Care – PPO

## 2011-05-23 DIAGNOSIS — K219 Gastro-esophageal reflux disease without esophagitis: Secondary | ICD-10-CM | POA: Insufficient documentation

## 2011-05-23 DIAGNOSIS — Z79899 Other long term (current) drug therapy: Secondary | ICD-10-CM | POA: Insufficient documentation

## 2011-05-23 DIAGNOSIS — R259 Unspecified abnormal involuntary movements: Secondary | ICD-10-CM | POA: Insufficient documentation

## 2011-05-23 DIAGNOSIS — I10 Essential (primary) hypertension: Secondary | ICD-10-CM | POA: Insufficient documentation

## 2011-05-23 DIAGNOSIS — R079 Chest pain, unspecified: Secondary | ICD-10-CM | POA: Insufficient documentation

## 2011-05-23 DIAGNOSIS — F341 Dysthymic disorder: Secondary | ICD-10-CM | POA: Insufficient documentation

## 2011-05-23 LAB — DIFFERENTIAL
Neutro Abs: 5.1 10*3/uL (ref 1.7–7.7)
Neutrophils Relative %: 71 % (ref 43–77)

## 2011-05-23 LAB — POCT I-STAT TROPONIN I
Troponin i, poc: 0 ng/mL (ref 0.00–0.08)
Troponin i, poc: 0 ng/mL (ref 0.00–0.08)

## 2011-05-23 LAB — POCT I-STAT, CHEM 8
Chloride: 106 mEq/L (ref 96–112)
Creatinine, Ser: 0.8 mg/dL (ref 0.50–1.10)
Glucose, Bld: 82 mg/dL (ref 70–99)
Hemoglobin: 13.3 g/dL (ref 12.0–15.0)
Sodium: 139 mEq/L (ref 135–145)
TCO2: 22 mmol/L (ref 0–100)

## 2011-05-23 LAB — CBC
MCHC: 33.9 g/dL (ref 30.0–36.0)
RBC: 4.27 MIL/uL (ref 3.87–5.11)

## 2011-05-24 ENCOUNTER — Encounter (INDEPENDENT_AMBULATORY_CARE_PROVIDER_SITE_OTHER): Payer: BC Managed Care – PPO | Admitting: Psychiatry

## 2011-05-24 DIAGNOSIS — F331 Major depressive disorder, recurrent, moderate: Secondary | ICD-10-CM

## 2011-05-26 ENCOUNTER — Encounter (HOSPITAL_COMMUNITY): Payer: BC Managed Care – PPO | Admitting: Psychiatry

## 2011-05-26 NOTE — Progress Notes (Signed)
[name], [name]                  ACCOUNT NO.:  [id number]  MEDICAL RECORD NO.:  [id number]  LOCATION:  [facility]                           [facility]ACILITY:  [facility]  PHYSICIAN:  Letzy Gullickson T. [name], M.D.   DATE OF BIRTH:  [birth year]                                PROGRESS NOTE   The patient came in today for her followup appointment.  She has recently visited yesterday to [facility] ER after she was experiencing chest pain under her left rib.  She also complained of shakiness and felt that was having a heart attack.  She has complete cardiac workup in the ER which was negative.  The patient has noticed that she has been more tremulous and having shakes since June.  I reviewed the chart.  We have increased her Risperdal in June to 1 mg twice a day, though her depression has improved but it is possible that she has been more tremulous.  This is the 1st time she has mentioned about her shakes. She also started Lamictal on her last visit, which she believed had helped her crying spells, though she is not irritable, angry and emotional but felt she continued to have these episodes on occasion.  LABORATORY FINDINGS: Her WBC count along with RBC count are within normal limits.  Her basic chemistry was also normal except for ionized calcium which is 1.08.  Her glucose, creatinine, sodium, potassium, and BUN are within normal limits.  CURRENT MEDICATIONS: 1. She is on Risperdal 1 mg twice a day. 2. She is on Valium 5 mg twice a day. 3. She is on Celexa 40 mg daily. 4. Recently Lamictal was started which she is now taking 50 mg a day.  MENTAL STATUS EXAMINATION: The patient is somewhat anxious but maintaining good eye contact.  She is fairly groomed.  Her speech is normal rate and rhythm.  She denies any active or passive suicidal thinking.  Her attention and concentration were okay.  There were no psychosis present.  Her thought processes were logical, linear, and goal directed but slow.  She is alert  and oriented x3.  There are very mild tremors noted in her hand but she denies any stiffness, chest pain at this time.  She is alert and oriented x3.  Her insight, judgment, impulse control were okay.  DIAGNOSES: 1. Axis I:  Major depressive disorder, recurrent, with psychotic     feature in partial remission. 2. Axis II:  Deferred. 3. Axis III:  Gastroesophageal reflux disease and hypertension. 4. Axis IV:  Mild to moderate. 5. Axis V:  60-65.  PLAN: I talked to the patient that these shakes may be due to increase in her Risperdal.  I recommended to cut down the Risperdal to only 1 mg at bedtime.  We will also add Cogentin 0.5 mg to target the side effects of the Risperdal which she will take at bedtime.  I will increase the Lamictal to 100 mg.  at this time she is experiencing no side effects of medication including any rash.  We will continuing her Valium and Celexa at this time.  She will schedule to see her primary care doctor in the  next few days at [facility].  I recommended to call us back if she has any question or concern.  Otherwise I will see her again in 4 weeks.  [name] Mustache, M.D.     STA/MEDQ  D:  05/24/2011  T:  05/25/2011  Job:  [id number]  Electronically Signed by Kathryne Sharper M.D. on 05/26/2011 08:30:06 AM

## 2011-06-14 ENCOUNTER — Ambulatory Visit (INDEPENDENT_AMBULATORY_CARE_PROVIDER_SITE_OTHER): Payer: BC Managed Care – PPO | Admitting: Psychiatry

## 2011-06-14 ENCOUNTER — Encounter (HOSPITAL_COMMUNITY): Payer: Self-pay | Admitting: Psychiatry

## 2011-06-14 VITALS — Wt 169.0 lb

## 2011-06-14 DIAGNOSIS — F331 Major depressive disorder, recurrent, moderate: Secondary | ICD-10-CM

## 2011-06-14 MED ORDER — LAMOTRIGINE 150 MG PO TABS: 150.0000 mg | ORAL_TABLET | Freq: Every day | ORAL | Status: DC

## 2011-06-14 MED ORDER — BENZTROPINE MESYLATE 1 MG PO TABS: 1.0000 mg | ORAL_TABLET | Freq: Every day | ORAL | Status: DC

## 2011-06-14 NOTE — Progress Notes (Signed)
Patient came today for her followup appointment. On her last visit we have reduced her as Risperdal to 1 mg and started Cogentin to address the shakes. Though her shakes has been slightly improved but she reported that it does not go away. She is scheduled to see neurologist in [place] on 19 of this month. She also started Lamictal and now she is taking 100 mg daily though she has notice some improvement in her mood and anger but she continued to have crying spells and depression. She reported no side effects of medication. There is no rash or itching noted. She see Dr. Greig Castilla at [facility]. Patient is casually dressed appears to be her stated age she maintained fair eye contact. Her speech is slow but clear and coherent her thought process was slow but logical and linear and goal-directed. There were no psychotic symptoms present her attention and concentration were okay. She is alert and oriented x3 her insight judgment and impulse control were okay.  Assessment. Maj. depressive disorder recurrent Plan I will continue her Risperdal 1 mg at bedtime we will increase Cogentin to 1 mg at bedtime I will also increase her Lamictal to 150 mg daily she still has refill on her Celexa and Valium I have explained the risks and benefits of medication in detail I have also reviewed all her medications and this visit. Her weight today was 169.8. Time spent 20 minutes I will see her again in 4 weeks.

## 2011-06-14 NOTE — Patient Instructions (Signed)
Take your medications on regular basis and call us if you have any question or concern.  

## 2011-07-12 ENCOUNTER — Ambulatory Visit (INDEPENDENT_AMBULATORY_CARE_PROVIDER_SITE_OTHER): Payer: BC Managed Care – PPO | Admitting: Psychiatry

## 2011-07-12 DIAGNOSIS — F331 Major depressive disorder, recurrent, moderate: Secondary | ICD-10-CM

## 2011-07-12 MED ORDER — CITALOPRAM HYDROBROMIDE 40 MG PO TABS: 40.0000 mg | ORAL_TABLET | Freq: Every day | ORAL | Status: DC

## 2011-07-12 MED ORDER — QUETIAPINE FUMARATE 100 MG PO TABS: 100.0000 mg | ORAL_TABLET | Freq: Every day | ORAL | Status: DC

## 2011-07-12 MED ORDER — DIAZEPAM 5 MG PO TABS: 5.0000 mg | ORAL_TABLET | Freq: Two times a day (BID) | ORAL | Status: DC

## 2011-07-12 NOTE — Progress Notes (Signed)
Patient came today for her followup appointment. She was seen by [facility] neurology and she was started on Artane 2 mg twice a day due to Parkinson-like symptoms. She does not feel much improvement however she liked to give more time to Artane. Overall she has been doing better on her current medication. She had a good Thanksgiving. She is less depressed less tearful and more involved in her daily routine. She continues to have mild shakes and tremors. She has been compliant with her Lamictal 150 mg one a day, Celexa 40 mg daily and Valium 5 mg twice there. She reported no rash itching or any other concern about the medication. She denies any agitation anger or mood swings. She denies any crying spells in past few weeks. She had a good Thanksgiving and she is looking forward to have a good Christmas. I talked to her that the shakes and Parkinson-like symptoms could be due to Risperdal. I recommended to try seroquelto see if the symptoms and shakes completely stopped. Patient is willing to try Seroquel in a small doses.She see Dr. Greig Castilla at [facility].  Mental Status Examination Patient is casually dressed appears to be her stated age she maintained fair eye contact. Her speech is slow but clear and coherent her thought process was slow but logical and linear and goal-directed. There were no psychotic symptoms present her attention and concentration were okay. She has mild shakes and tremors but there no stiffness. There are no psychotic symptoms present. She denies any delusions obsessions or paranoid thinking. She denies any auditory or visual hallucination. She denies any active or passive suicidal thinking. She is alert and oriented x3 her insight judgment and impulse control were okay.  Assessment. Maj. depressive disorder recurrent  Plan I will discontinue her Risperdal and cogentin. I will seroquel 100 mg. She will take 1/2 -1 tab at bed time. Will closely monitor if the shakes  and tremors stopped. She will continue to take Artane 2 mg twice there. Patient does not endorse any other concern or side effects of medication. She will continue to take Lamictal to 150 mg daily, Celexa 40 mg and Valium 5 mg. I have explained the risks and benefits of medication in detail. A new prescription of Lamictal [place] Dec called Celexa and Valium has been given with refills. I have recommended to call if she has any question or concern about the medication or if she feels worsening of her symptoms or any time having suicidal thinking and homicidal thinking than she need to call 911 or go to ER. I will see her again in 6 weeks   I have also reviewed all her medications and this visit. Her weight today was 169.8. Time spent 20 minutes I will see her again in 4 weeks.

## 2011-09-20 ENCOUNTER — Ambulatory Visit (HOSPITAL_COMMUNITY): Payer: BC Managed Care – PPO | Admitting: Psychiatry

## 2011-09-29 ENCOUNTER — Ambulatory Visit (INDEPENDENT_AMBULATORY_CARE_PROVIDER_SITE_OTHER): Payer: BC Managed Care – PPO | Admitting: Psychiatry

## 2011-09-29 ENCOUNTER — Encounter (HOSPITAL_COMMUNITY): Payer: Self-pay | Admitting: Psychiatry

## 2011-09-29 VITALS — Wt 162.0 lb

## 2011-09-29 DIAGNOSIS — F331 Major depressive disorder, recurrent, moderate: Secondary | ICD-10-CM

## 2011-09-29 MED ORDER — DIAZEPAM 5 MG PO TABS: 5.0000 mg | ORAL_TABLET | ORAL | Status: DC | PRN

## 2011-09-29 MED ORDER — CITALOPRAM HYDROBROMIDE 40 MG PO TABS: 40.0000 mg | ORAL_TABLET | Freq: Every day | ORAL | Status: DC

## 2011-09-29 MED ORDER — LAMOTRIGINE 150 MG PO TABS: 150.0000 mg | ORAL_TABLET | Freq: Every day | ORAL | Status: DC

## 2011-09-29 MED ORDER — QUETIAPINE FUMARATE 50 MG PO TABS: 50.0000 mg | ORAL_TABLET | Freq: Every day | ORAL | Status: DC

## 2011-09-29 MED ORDER — DIAZEPAM 5 MG PO TABS
5.0000 mg | ORAL_TABLET | Freq: Two times a day (BID) | ORAL | Status: DC
Start: 2011-09-29 — End: 2011-09-29

## 2011-09-29 NOTE — Progress Notes (Signed)
Chief complaint I'm doing better  History of presenting illness Patient is 51 year old Caucasian employed female who came for her followup appointment. She is doing better on Seroquel however she is taking half tablet as she tried full tablet and that makes her very sleepy. She is less anxious and less tearful. She is sleeping 8 hours with no issues. She reported her job is also going very well. She had a very good Christmas. She still takes Artane for her tremors prescribed by neurologist. She is scheduled to see neurologist in June. We have stopped Cogentin and Risperdal on last visit believing if the medicine causing the tremors. The patient seen some improvement however she continues to have tremors and like to continue Artane. She is excited as she lost some weight and more cautious about her diet.  Mental Status Examination Patient is casually dressed and well groomed. She maintained good eye contact. Her speech is soft clear and coherent. Her thought process is logical linear and goal-directed. She denies any active or passive suicidal thinking and homicidal thinking. There are no psychotic symptoms present. She's alert and oriented x3. Her insight judgment and impulse control is okay.  Assessment. Axis I Maj. depressive disorder recurrent Axis II deferred Axis III hypertension controlled by diet GERD Axis IV mild  Plan I will continue Seroquel 50 mg at bedtime. She is also taking Valium 5 mg as needed. I will continue her Lamictal, Remeron and Celexa on her current dose. She has some tremors however she was seen by neurologist and look like she has parkinsonian features. I have explained risks and benefits of medication. I will see her again in 2 months.

## 2011-11-12 ENCOUNTER — Emergency Department (INDEPENDENT_AMBULATORY_CARE_PROVIDER_SITE_OTHER): Payer: BC Managed Care – PPO

## 2011-11-12 ENCOUNTER — Other Ambulatory Visit: Payer: Self-pay

## 2011-11-12 ENCOUNTER — Encounter (HOSPITAL_COMMUNITY): Payer: Self-pay | Admitting: *Deleted

## 2011-11-12 ENCOUNTER — Emergency Department (INDEPENDENT_AMBULATORY_CARE_PROVIDER_SITE_OTHER)
Admission: EM | Admit: 2011-11-12 | Discharge: 2011-11-12 | Disposition: A | Discharge status: Home / Self Care | Payer: BC Managed Care – PPO | Source: Home / Self Care | Attending: Emergency Medicine | Admitting: Emergency Medicine

## 2011-11-12 DIAGNOSIS — M546 Pain in thoracic spine: Secondary | ICD-10-CM

## 2011-11-12 DIAGNOSIS — R1011 Right upper quadrant pain: Secondary | ICD-10-CM

## 2011-11-12 HISTORY — DX: Depression, unspecified: F32.A

## 2011-11-12 HISTORY — DX: Major depressive disorder, single episode, unspecified: F32.9

## 2011-11-12 LAB — POCT URINALYSIS DIP (DEVICE)
Leukocytes, UA: NEGATIVE
Nitrite: NEGATIVE
Urobilinogen, UA: 1 mg/dL (ref 0.0–1.0)
pH: 7 (ref 5.0–8.0)

## 2011-11-12 NOTE — ED Notes (Addendum)
Pt with c/o right upper abdominal pain onset x 3 weeks intermittent - low back pain onset yesterday worse with movement denies urinary symptoms -

## 2011-11-12 NOTE — Discharge Instructions (Signed)
Start taking the Valium. This is an excellent muscle relaxant. Continue taking the diclofenac as well. This is a good anti-inflammatory for musculoskeletal pain, which it appears that you have at this point in time. Return immediately to the ER if your pain changes, gets worse, if you start having chest pain, shortness of breath, nausea, vomiting, if you feels sweaty, if you feel your heart beating very fast rate or irregularly, if you feel like you're about to passout, if you do pass out, or for any other concerns. You need to see your Dr. in 2 days, for further evaluation of your abdominal pain.

## 2011-11-12 NOTE — ED Provider Notes (Signed)
History     CSN: [id number]  Arrival date & time 11/12/11  1742   First MD Initiated Contact with Patient 11/12/11 1801      Chief Complaint  Patient presents with  . Back Pain  . Abdominal Pain    (Consider location/radiation/quality/duration/timing/severity/associated sxs/prior treatment) HPI Comments: Patient presents with 2 concerns: First, patient reports  constant, achy,  nonradiating,mid thoracic back pain worse with torso rotation, bending forward starting yesterday. No positional or exertional component. No diaphoresis, palpitations nausea, vomiting, chest pain, wheezing, shortness breath, coughing. Patient does not recall any recent or remote history of trauma to her back. No rash. No new physical activity, and does not perform any repetitive tasks at work. Patient took some diclofenac today without relief. Otherwise, has not tried anything else. Patient had similar pain previously, was thought to have a "pinched nerve".   Second, patient reports 3 weeks of intermittent, sharp, nonradiating, right upper quadrant pain. Seems to happen approximately an hour after she eats. Pain lasts seconds to minutes, and then resolves. Has not noticed any other aggravating factors. No alleviating factors. No nausea, vomiting, abdominal distention, diarrhea, constipation, urinary complaints. No chest pain, shortness of breath. Fevers, Patient denies any alcohol use, chronic NSAID use. Patient states that she is currently being evaluated by her primary care physician for this, and had a normal gallbladder ultrasound recently. Patient states that the pain is unchanged since it started 3 weeks ago, but has not gotten any better.   ROS as noted in HPI. All other ROS negative.   Patient is a 51 y.o. female presenting with back pain and abdominal pain. The history is provided by the patient. No language interpreter was used.  Back Pain  This is a new problem. The current episode started yesterday. The  problem occurs constantly. The problem has not changed since onset.The pain is associated with no known injury. The pain is present in the thoracic spine. The quality of the pain is described as aching. The pain does not radiate. The symptoms are aggravated by bending, twisting and certain positions. The pain is the same all the time. She has tried NSAIDs for the symptoms. The treatment provided no relief. Risk factors include poor posture.  Abdominal Pain The current episode started more than 2 days ago. The onset of the illness was gradual. The problem has not changed since onset. The patient has not had a change in bowel habit. Additional symptoms associated with the illness include back pain. Significant associated medical issues include GERD.    Past Medical History  Diagnosis Date  . HTN (hypertension)   . GERD (gastroesophageal reflux disease)   . Depression   . Chest pain     Past Surgical History  Procedure Date  . Tubal ligation   . Foot surgery     History reviewed. No pertinent family history.  History  Substance Use Topics  . Smoking status: Never Smoker   . Smokeless tobacco: Not on file  . Alcohol Use: Yes     on ocassion    OB History    Grav Para Term Preterm Abortions TAB SAB Ect Mult Living                  Review of Systems  Musculoskeletal: Positive for back pain.    Allergies  Cortisporin  Home Medications   Current Outpatient Rx  Name Route Sig Dispense Refill  . CITALOPRAM HYDROBROMIDE 40 MG PO TABS Oral Take 1 tablet (40 mg  total) by mouth daily. 90 tablet 0  . DIAZEPAM 5 MG PO TABS Oral Take 1 tablet (5 mg total) by mouth as needed for anxiety. 30 tablet 1  . DICLOFENAC SODIUM 75 MG PO TBEC Oral Take 75 mg by mouth 2 (two) times daily.    Marland Kitchen ESOMEPRAZOLE MAGNESIUM 40 MG PO CPDR Oral Take 40 mg by mouth daily before breakfast.      . TOPIRAMATE 25 MG PO CPSP Oral Take 25 mg by mouth daily.    . QUETIAPINE FUMARATE 100 MG PO TABS Oral Take 1  tablet (100 mg total) by mouth at bedtime. 30 tablet 1  . QUETIAPINE FUMARATE 50 MG PO TABS Oral Take 1 tablet (50 mg total) by mouth at bedtime. 90 tablet 0    BP 158/81  Pulse 81  Temp(Src) 98.7 F (37.1 C) (Oral)  Resp 17  SpO2 97%  LMP 11/05/2011  Physical Exam  Nursing note and vitals reviewed. Constitutional: She is oriented to person, place, and time. She appears well-developed and well-nourished. No distress.  HENT:  Head: Normocephalic and atraumatic.  Eyes: Conjunctivae and EOM are normal.  Neck: Normal range of motion.  Cardiovascular: Normal rate, regular rhythm and normal heart sounds.   Pulmonary/Chest: Effort normal and breath sounds normal. No respiratory distress. She exhibits tenderness.  Abdominal: Soft. Normal appearance and bowel sounds are normal. She exhibits no distension and no mass. There is no hepatomegaly. There is tenderness. There is no rebound, no guarding, no CVA tenderness, no tenderness at McBurney's point and negative Murphy's sign.       Mild ruq tenderness.  Musculoskeletal: Normal range of motion. She exhibits no edema.       Thoracic back: She exhibits tenderness and spasm. She exhibits normal range of motion, no bony tenderness and no swelling.       Back:       Pain aggravated with forward bending, torso rotation. No rash.  Neurological: She is alert and oriented to person, place, and time.  Skin: Skin is warm and dry.  Psychiatric: She has a normal mood and affect. Her behavior is normal. Judgment and thought content normal.    ED Course  Procedures (including critical care time)  Labs Reviewed  POCT URINALYSIS DIP (DEVICE) - Abnormal; Notable for the following:    Bilirubin Urine SMALL (*)    Ketones, ur 40 (*)    Protein, ur 30 (*)    All other components within normal limits   Dg Chest 2 View  11/12/2011  *RADIOLOGY REPORT*  Clinical Data: Mid thoracic pain.  CHEST - 2 VIEW  Comparison: 05/23/2011  Findings: Lungs are clear  without infiltrate or effusion.  Negative for heart failure.  No mass lesion is present. 1 cm density overlying the mid thoracic spine is felt to be due to overlying bone densities.  IMPRESSION: No active cardiopulmonary disease.  Original Report Authenticated By: Camelia Phenes, M.D.   Results for orders placed during the hospital encounter of 11/12/11  POCT URINALYSIS DIP (DEVICE)      Component Value Range   Glucose, UA NEGATIVE  NEGATIVE (mg/dL)   Bilirubin Urine SMALL (*) NEGATIVE    Ketones, ur 40 (*) NEGATIVE (mg/dL)   Specific Gravity, Urine 1.025  1.005 - 1.030    Hgb urine dipstick NEGATIVE  NEGATIVE    pH 7.0  5.0 - 8.0    Protein, ur 30 (*) NEGATIVE (mg/dL)   Urobilinogen, UA 1.0  0.0 - 1.0 (mg/dL)  Nitrite NEGATIVE  NEGATIVE    Leukocytes, UA NEGATIVE  NEGATIVE       1. Back pain, thoracic   2. Abdominal pain, RUQ     MDM  X-ray reviewed by myself. [facility]. Full report per radiologist.   EKG: Normal sinus rhythm, rate 79. Normal axis. Slightly prolonged QT, improved from previous EKG. T-wave inversion in septal leads V2, V3, seen in previous EKG from October 2012. No other ST-T wave changes.  Previous records, labs reviewed. Patient was seen in the ER in 2012 for sharp, lower chest pain. Initial EKG showed some abnormalities, however, serial troponins were negative. Patient had a normal echo in 2008. Patient states that today's back and abdominal pain does not feel anything like the pain that she presented with in 2012. Patient's abdominal pain is currently in process of being worked up by her primary care physician. States that this pain is unchanged since it started 3 weeks ago. History is most suggestive of a biliary colic. Per patient, recent gallbladder ultrasound was negative for stones. Patient afebrile, nontoxic,  patient is tolerating by mouth, abdomen benign. Do not suspect cholecystitis, pancreatitis. Will have her followup with her primary care physician in 2 days  about this. Patient's mid thoracic pain appears to be musculoskeletal in origin at this time. Patient noted to have poor posture during exam, and has tenderness in the midthoracic region. It is aggravated with torso rotation, movement. EKG shows no significant changes, and udip negative for UTI, chest x-ray is normal. Patient has Valium and NSAIDs at home, will have her start this on a regular basis. Discussed signs, symptoms of serious cause of her back pain, and Give her strict return instructions on when to go to the ED. Patient agrees with plan.  Luiz Blare, MD 11/12/11 2125

## 2011-11-24 ENCOUNTER — Ambulatory Visit (HOSPITAL_COMMUNITY): Payer: BC Managed Care – PPO | Admitting: Psychiatry

## 2012-04-12 ENCOUNTER — Ambulatory Visit: Payer: BC Managed Care – PPO | Attending: Specialist | Admitting: Physical Therapy

## 2012-04-12 DIAGNOSIS — M545 Low back pain, unspecified: Secondary | ICD-10-CM | POA: Insufficient documentation

## 2012-04-12 DIAGNOSIS — R5381 Other malaise: Secondary | ICD-10-CM | POA: Insufficient documentation

## 2012-04-12 DIAGNOSIS — IMO0001 Reserved for inherently not codable concepts without codable children: Secondary | ICD-10-CM | POA: Insufficient documentation

## 2012-04-12 DIAGNOSIS — R293 Abnormal posture: Secondary | ICD-10-CM | POA: Insufficient documentation

## 2012-04-12 DIAGNOSIS — M2560 Stiffness of unspecified joint, not elsewhere classified: Secondary | ICD-10-CM | POA: Insufficient documentation

## 2012-04-16 ENCOUNTER — Ambulatory Visit: Payer: BC Managed Care – PPO | Admitting: *Deleted

## 2012-04-20 ENCOUNTER — Ambulatory Visit: Payer: BC Managed Care – PPO | Admitting: *Deleted

## 2012-04-25 ENCOUNTER — Ambulatory Visit: Payer: BC Managed Care – PPO | Admitting: Physical Therapy

## 2012-04-30 ENCOUNTER — Ambulatory Visit: Payer: BC Managed Care – PPO | Admitting: *Deleted

## 2012-05-04 ENCOUNTER — Encounter: Payer: BC Managed Care – PPO | Admitting: *Deleted

## 2012-05-04 ENCOUNTER — Ambulatory Visit: Payer: BC Managed Care – PPO | Admitting: Physical Therapy

## 2012-05-10 ENCOUNTER — Encounter: Payer: BC Managed Care – PPO | Admitting: Physical Therapy

## 2012-05-15 ENCOUNTER — Ambulatory Visit: Payer: BC Managed Care – PPO | Attending: Specialist | Admitting: Physical Therapy

## 2012-05-15 DIAGNOSIS — R5381 Other malaise: Secondary | ICD-10-CM | POA: Insufficient documentation

## 2012-05-15 DIAGNOSIS — M545 Low back pain, unspecified: Secondary | ICD-10-CM | POA: Insufficient documentation

## 2012-05-15 DIAGNOSIS — R293 Abnormal posture: Secondary | ICD-10-CM | POA: Insufficient documentation

## 2012-05-15 DIAGNOSIS — IMO0001 Reserved for inherently not codable concepts without codable children: Secondary | ICD-10-CM | POA: Insufficient documentation

## 2012-05-15 DIAGNOSIS — M2560 Stiffness of unspecified joint, not elsewhere classified: Secondary | ICD-10-CM | POA: Insufficient documentation

## 2012-05-24 ENCOUNTER — Encounter: Payer: BC Managed Care – PPO | Admitting: Physical Therapy

## 2012-06-06 DIAGNOSIS — M5137 Other intervertebral disc degeneration, lumbosacral region: Secondary | ICD-10-CM | POA: Insufficient documentation

## 2012-06-06 DIAGNOSIS — M51379 Other intervertebral disc degeneration, lumbosacral region without mention of lumbar back pain or lower extremity pain: Secondary | ICD-10-CM | POA: Insufficient documentation

## 2012-06-06 DIAGNOSIS — M549 Dorsalgia, unspecified: Secondary | ICD-10-CM | POA: Insufficient documentation

## 2012-08-02 HISTORY — PX: OTHER SURGICAL HISTORY: SHX169

## 2012-08-02 HISTORY — PX: METATARSAL OSTEOTOMY WITH BUNIONECTOMY: SHX5662

## 2012-10-29 ENCOUNTER — Encounter: Payer: Self-pay | Admitting: *Deleted

## 2012-10-30 ENCOUNTER — Ambulatory Visit (INDEPENDENT_AMBULATORY_CARE_PROVIDER_SITE_OTHER): Payer: BC Managed Care – PPO | Admitting: Podiatry

## 2012-10-30 ENCOUNTER — Encounter: Payer: Self-pay | Admitting: Podiatry

## 2012-10-30 VITALS — BP 175/118 | HR 107 | Ht 60.0 in | Wt 177.0 lb

## 2012-10-30 DIAGNOSIS — Z9889 Other specified postprocedural states: Secondary | ICD-10-CM

## 2012-10-30 NOTE — Progress Notes (Signed)
Subjective: 52 y.o. year old female patient presents for post op care.   S/P 3 month Fusion of the first Metatarsocuneiform joint to stabilize unstable first ray and to address constant foot pain during ambulation on left foot. Now walking in tennis shoes and Orthotics without any pain or difficulty.  2nd week since been on feet full days. The first week was sore, the second week was better with minimum discomfort.  Objective: Full range of motion at ankle and rearfoot. First ray is in line with the 2nd metatarsal. No motion at the fusion site. No edema or erythema surrounding surgical site.  Assessment: Satisfactory recovery following left foot surgery. Good pain free ambulation achieved.  Plan:  Discharge from post op care. Patient will return to work on November 14, 2012. Return as needed.

## 2012-10-30 NOTE — Patient Instructions (Addendum)
Status post left foot surgery: Fusion of the Metatarsocuneiform joint with screw fixation done on 08/02/2012. Recovered well without complication.  May resume normal activity with full weight bearing.  May return to work on November 14, 2012 without restrictions.  May use this note to verify presence of hardware (two screws) on left foot.  Return to office as needed.

## 2012-11-13 ENCOUNTER — Encounter: Payer: Self-pay | Admitting: Nurse Practitioner

## 2012-11-13 ENCOUNTER — Ambulatory Visit (INDEPENDENT_AMBULATORY_CARE_PROVIDER_SITE_OTHER): Payer: BC Managed Care – PPO | Admitting: Nurse Practitioner

## 2012-11-13 VITALS — BP 128/89 | HR 82 | Temp 98.5°F | Resp 20 | Ht 60.0 in | Wt 175.0 lb

## 2012-11-13 DIAGNOSIS — F329 Major depressive disorder, single episode, unspecified: Secondary | ICD-10-CM

## 2012-11-13 DIAGNOSIS — Z0000 Encounter for general adult medical examination without abnormal findings: Secondary | ICD-10-CM

## 2012-11-13 DIAGNOSIS — F32A Depression, unspecified: Secondary | ICD-10-CM | POA: Insufficient documentation

## 2012-11-13 DIAGNOSIS — Z01419 Encounter for gynecological examination (general) (routine) without abnormal findings: Secondary | ICD-10-CM

## 2012-11-13 DIAGNOSIS — G4700 Insomnia, unspecified: Secondary | ICD-10-CM

## 2012-11-13 DIAGNOSIS — I10 Essential (primary) hypertension: Secondary | ICD-10-CM

## 2012-11-13 DIAGNOSIS — F411 Generalized anxiety disorder: Secondary | ICD-10-CM | POA: Insufficient documentation

## 2012-11-13 DIAGNOSIS — K219 Gastro-esophageal reflux disease without esophagitis: Secondary | ICD-10-CM | POA: Insufficient documentation

## 2012-11-13 DIAGNOSIS — F3289 Other specified depressive episodes: Secondary | ICD-10-CM

## 2012-11-13 LAB — COMPLETE METABOLIC PANEL WITH GFR
ALT: 27 U/L (ref 0–35)
CO2: 23 mEq/L (ref 19–32)
Calcium: 8.9 mg/dL (ref 8.4–10.5)
Chloride: 101 mEq/L (ref 96–112)
Creat: 0.63 mg/dL (ref 0.50–1.10)
GFR, Est African American: >89 mL/min
GFR, Est Non African American: >89 mL/min
Glucose, Bld: 88 mg/dL (ref 70–99)
Total Bilirubin: 0.4 mg/dL (ref 0.3–1.2)
Total Protein: 7.5 g/dL (ref 6.0–8.3)

## 2012-11-13 LAB — POCT URINALYSIS DIPSTICK
Blood, UA: NEGATIVE
Glucose, UA: NEGATIVE
Nitrite, UA: NEGATIVE
Protein, UA: NEGATIVE
Spec Grav, UA: 1.01
Urobilinogen, UA: NEGATIVE
pH, UA: 8

## 2012-11-13 LAB — POCT CBC
HCT, POC: 42 % (ref 37.7–47.9)
Hemoglobin: 14 g/dL (ref 12.2–16.2)
Lymph, poc: 1.6 (ref 0.6–3.4)
MCH, POC: 27.5 pg (ref 27–31.2)
MCHC: 33.4 g/dL (ref 31.8–35.4)
MPV: 8.3 fL (ref 0–99.8)
RBC: 5.1 M/uL (ref 4.04–5.48)
WBC: 11.6 10*3/uL — AB (ref 4.6–10.2)

## 2012-11-13 LAB — POCT UA - MICROSCOPIC ONLY
Casts, Ur, LPF, POC: NEGATIVE
Crystals, Ur, HPF, POC: NEGATIVE
RBC, urine, microscopic: NEGATIVE

## 2012-11-13 LAB — THYROID PANEL WITH TSH: TSH: 1.594 u[IU]/mL (ref 0.350–4.500)

## 2012-11-13 MED ORDER — PANTOPRAZOLE SODIUM 40 MG PO TBEC: 40.0000 mg | DELAYED_RELEASE_TABLET | Freq: Every day | ORAL | Status: DC

## 2012-11-13 NOTE — Patient Instructions (Signed)

## 2012-11-13 NOTE — Progress Notes (Signed)
Subjective:    Patient ID: Jerrell Belfast, female    DOB: 12/14/1960, 52 y.o.   MRN: [id number]  Patient here today for CPE and PAP Hypertension This is a chronic problem. The current episode started more than 1 year ago. The problem is unchanged. The problem is controlled. Pertinent negatives include no chest pain, headaches, palpitations, peripheral edema or shortness of breath. There are no associated agents to hypertension. Risk factors for coronary artery disease include obesity and post-menopausal state. Past treatments include ACE inhibitors. The current treatment provides no improvement. Compliance problems include diet and exercise.   Gastrophageal Reflux She reports no abdominal pain, no chest pain, no coughing, no heartburn, no hoarse voice, no nausea, no sore throat or no tooth decay. This is a chronic problem. The current episode started more than 1 year ago. The problem occurs rarely. The problem has been rapidly improving. Nothing aggravates the symptoms. Pertinent negatives include no weight loss. Risk factors include obesity. She has tried a PPI for the symptoms. The treatment provided significant relief.  Depression/GAD This is a chronic problem. Currently only taking Ativen. Is followed by Dr. Marcelle Overlie. Has appointment for follow-up on Friday. Insomnia Taking trazadone and working well most of the time. Usually wakes up rested.    Review of Systems  Constitutional: Negative for weight loss.  HENT: Negative for sore throat and hoarse voice.   Respiratory: Negative for cough and shortness of breath.   Cardiovascular: Negative for chest pain and palpitations.  Gastrointestinal: Negative for heartburn, nausea and abdominal pain.  Neurological: Negative for headaches.  All other systems reviewed and are negative.       Objective:   Physical Exam  Constitutional: She is oriented to person, place, and time. She appears well-developed and well-nourished.  HENT:  Head:  Normocephalic.  Right Ear: Hearing, tympanic membrane, external ear and ear canal normal.  Left Ear: Hearing, tympanic membrane, external ear and ear canal normal.  Nose: Nose normal.  Mouth/Throat: Uvula is midline and oropharynx is clear and moist.  Eyes: Conjunctivae and EOM are normal. Pupils are equal, round, and reactive to light.  Neck: Normal range of motion and full passive range of motion without pain. Neck supple. No JVD present. Carotid bruit is not present. No mass and no thyromegaly present.  Cardiovascular: Normal rate, normal heart sounds and intact distal pulses.   No murmur heard. Pulmonary/Chest: Effort normal and breath sounds normal. Right breast exhibits no inverted nipple, no mass, no nipple discharge, no skin change and no tenderness. Left breast exhibits no inverted nipple, no mass, no nipple discharge, no skin change and no tenderness.  Abdominal: Soft. Bowel sounds are normal. She exhibits no mass. There is no tenderness.  Genitourinary: Vagina normal and uterus normal. No breast swelling, tenderness, discharge or bleeding.  bimanual exam-No adnexal masses or tenderness. Large rectocele Negative hemocult  Musculoskeletal: Normal range of motion.  Lymphadenopathy:    She has no cervical adenopathy.  Neurological: She is alert and oriented to person, place, and time.  Skin: Skin is warm and dry.  Psychiatric: She has a normal mood and affect. Her behavior is normal. Judgment and thought content normal.  BP 128/89  Pulse 82  Temp(Src) 98.5 F (36.9 C) (Oral)  Resp 20  Ht 5' (1.524 m)  Wt 175 lb (79.379 kg)  BMI 34.18 kg/m2  LMP 10/20/2012 Results for orders placed in visit on 11/13/12  POCT URINALYSIS DIPSTICK      Result Value Range  Color, UA yellow     Clarity, UA clear     Glucose, UA neg     Bilirubin, UA neg     Ketones, UA neg     Spec Grav, UA 1.010     Blood, UA neg     pH, UA 8.0     Protein, UA neg     Urobilinogen, UA negative      Nitrite, UA neg     Leukocytes, UA Trace    POCT UA - MICROSCOPIC ONLY      Result Value Range   WBC, Ur, HPF, POC 1-5     RBC, urine, microscopic neg     Bacteria, U Microscopic neg     Mucus, UA neg     Epithelial cells, urine per micros occ     Crystals, Ur, HPF, POC neg     Casts, Ur, LPF, POC neg     Yeast, UA occ            Assessment & Plan:  CPE Patient Active Problem List  Diagnosis  . Post-operative state  . Essential hypertension, benign  . Depression  . GAD (generalized anxiety disorder)  . GERD (gastroesophageal reflux disease)  . Insomnia   Continue all meds Labs pending Keep follow-up appointment with Psych Diet and exercise encouraged Reviewed health maintenance Patient will make appointment for eye exam. Mary-Margaret Daphine Deutscher, FNP

## 2012-11-14 LAB — PAP IG W/ RFLX HPV ASCU

## 2012-11-14 LAB — NMR LIPOPROFILE WITH LIPIDS
HDL Particle Number: 49.2 umol/L (ref >=30.5)
HDL Size: 9.8 nm (ref >=9.2)
HDL-C: 107 mg/dL (ref >=40)
LDL (calc): 111 mg/dL — ABNORMAL HIGH (ref <100)
LDL Size: 20.7 nm (ref >20.5)
Large HDL-P: 16.3 umol/L (ref >=4.8)
Small LDL Particle Number: 418 nmol/L (ref <=527)

## 2012-11-14 LAB — VITAMIN D 25 HYDROXY (VIT D DEFICIENCY, FRACTURES): Vit D, 25-Hydroxy: 35 ng/mL (ref 30–89)

## 2012-11-16 ENCOUNTER — Telehealth: Payer: Self-pay | Admitting: *Deleted

## 2012-11-16 ENCOUNTER — Other Ambulatory Visit: Payer: Self-pay | Admitting: Nurse Practitioner

## 2012-11-16 DIAGNOSIS — IMO0002 Reserved for concepts with insufficient information to code with codable children: Secondary | ICD-10-CM

## 2012-11-16 NOTE — Telephone Encounter (Signed)
Message copied by Tamera Punt on Fri Nov 16, 2012  8:40 AM ------      Message from: Bennie Pierini      Created: Wed Nov 14, 2012  5:04 PM       PAP-abnormal LISIL-Needs colposcopy- WHere would like to go      All other labs look ok except WBC elevated. Is patient symptomatic of anything? ------

## 2012-11-16 NOTE — Telephone Encounter (Signed)
PT AWARE AND REFERRAL SENT TO [facility]

## 2012-12-24 ENCOUNTER — Other Ambulatory Visit: Payer: Self-pay

## 2012-12-24 DIAGNOSIS — K219 Gastro-esophageal reflux disease without esophagitis: Secondary | ICD-10-CM

## 2012-12-24 MED ORDER — PANTOPRAZOLE SODIUM 40 MG PO TBEC: 40.0000 mg | DELAYED_RELEASE_TABLET | Freq: Every day | ORAL | Status: DC

## 2012-12-24 NOTE — Telephone Encounter (Signed)
lmom 

## 2012-12-24 NOTE — Telephone Encounter (Signed)
Rx ready for pick up- [facility]

## 2012-12-24 NOTE — Telephone Encounter (Signed)
Last seen 9/13  If approved print and have nurse call patient for pick up

## 2013-03-07 ENCOUNTER — Other Ambulatory Visit: Payer: Self-pay | Admitting: *Deleted

## 2013-03-07 DIAGNOSIS — K219 Gastro-esophageal reflux disease without esophagitis: Secondary | ICD-10-CM

## 2013-03-07 MED ORDER — PANTOPRAZOLE SODIUM 40 MG PO TBEC: 40.0000 mg | DELAYED_RELEASE_TABLET | Freq: Every day | ORAL | Status: DC

## 2013-03-15 ENCOUNTER — Other Ambulatory Visit: Payer: Self-pay | Admitting: *Deleted

## 2013-03-15 DIAGNOSIS — K219 Gastro-esophageal reflux disease without esophagitis: Secondary | ICD-10-CM

## 2013-03-15 MED ORDER — PANTOPRAZOLE SODIUM 40 MG PO TBEC: 40.0000 mg | DELAYED_RELEASE_TABLET | Freq: Every day | ORAL | Status: DC

## 2013-06-21 ENCOUNTER — Encounter: Payer: Self-pay | Admitting: General Practice

## 2013-06-21 ENCOUNTER — Ambulatory Visit (INDEPENDENT_AMBULATORY_CARE_PROVIDER_SITE_OTHER): Payer: BC Managed Care – PPO

## 2013-06-21 ENCOUNTER — Ambulatory Visit (INDEPENDENT_AMBULATORY_CARE_PROVIDER_SITE_OTHER): Payer: BC Managed Care – PPO | Admitting: General Practice

## 2013-06-21 VITALS — BP 111/76 | HR 115 | Temp 96.9°F | Ht 60.0 in | Wt 173.0 lb

## 2013-06-21 DIAGNOSIS — R079 Chest pain, unspecified: Secondary | ICD-10-CM

## 2013-06-21 DIAGNOSIS — N390 Urinary tract infection, site not specified: Secondary | ICD-10-CM

## 2013-06-21 DIAGNOSIS — R059 Cough, unspecified: Secondary | ICD-10-CM

## 2013-06-21 DIAGNOSIS — IMO0001 Reserved for inherently not codable concepts without codable children: Secondary | ICD-10-CM

## 2013-06-21 DIAGNOSIS — R0781 Pleurodynia: Secondary | ICD-10-CM

## 2013-06-21 DIAGNOSIS — R05 Cough: Secondary | ICD-10-CM

## 2013-06-21 DIAGNOSIS — R350 Frequency of micturition: Secondary | ICD-10-CM

## 2013-06-21 LAB — POCT UA - MICROSCOPIC ONLY: Crystals, Ur, HPF, POC: NEGATIVE

## 2013-06-21 LAB — POCT URINALYSIS DIPSTICK
Blood, UA: NEGATIVE
Ketones, UA: NEGATIVE
Spec Grav, UA: 1.025
Urobilinogen, UA: NEGATIVE
pH, UA: 5

## 2013-06-21 MED ORDER — CIPROFLOXACIN HCL 500 MG PO TABS: 500.0000 mg | ORAL_TABLET | Freq: Two times a day (BID) | ORAL | Status: DC

## 2013-06-21 MED ORDER — BENZONATATE 100 MG PO CAPS: 100.0000 mg | ORAL_CAPSULE | Freq: Two times a day (BID) | ORAL | Status: DC | PRN

## 2013-06-21 NOTE — Patient Instructions (Addendum)
Cough, Adult  A cough is a reflex that helps clear your throat and airways. It can help heal the body or may be a reaction to an irritated airway. A cough may only last 2 or 3 weeks (acute) or may last more than 8 weeks (chronic).  CAUSES Acute cough:  Viral or bacterial infections. Chronic cough:  Infections.  Allergies.  Asthma.  Post-nasal drip.  Smoking.  Heartburn or acid reflux.  Some medicines.  Chronic lung problems (COPD).  Cancer. SYMPTOMS   Cough.  Fever.  Chest pain.  Increased breathing rate.  High-pitched whistling sound when breathing (wheezing).  Colored mucus that you cough up (sputum). TREATMENT   A bacterial cough may be treated with antibiotic medicine.  A viral cough must run its course and will not respond to antibiotics.  Your caregiver may recommend other treatments if you have a chronic cough. HOME CARE INSTRUCTIONS   Only take over-the-counter or prescription medicines for pain, discomfort, or fever as directed by your caregiver. Use cough suppressants only as directed by your caregiver.  Use a cold steam vaporizer or humidifier in your bedroom or home to help loosen secretions.  Sleep in a semi-upright position if your cough is worse at night.  Rest as needed.  Stop smoking if you smoke. SEEK IMMEDIATE MEDICAL CARE IF:   You have pus in your sputum.  Your cough starts to worsen.  You cannot control your cough with suppressants and are losing sleep.  You begin coughing up blood.  You have difficulty breathing.  You develop pain which is getting worse or is uncontrolled with medicine.  You have a fever. MAKE SURE YOU:   Understand these instructions.  Will watch your condition.  Will get help right away if you are not doing well or get worse. Document Released: 01/21/2011 Document Revised: 10/17/2011 Document Reviewed: 01/21/2011 [facility] Patient Information 2014 [place], [place]. Urinary Tract Infection Urinary  tract infections (UTIs) can develop anywhere along your urinary tract. Your urinary tract is your body's drainage system for removing wastes and extra water. Your urinary tract includes two kidneys, two ureters, a bladder, and a urethra. Your kidneys are a pair of bean-shaped organs. Each kidney is about the size of your fist. They are located below your ribs, one on each side of your spine. CAUSES Infections are caused by microbes, which are microscopic organisms, including fungi, viruses, and bacteria. These organisms are so small that they can only be seen through a microscope. Bacteria are the microbes that most commonly cause UTIs. SYMPTOMS  Symptoms of UTIs may vary by age and gender of the patient and by the location of the infection. Symptoms in young women typically include a frequent and intense urge to urinate and a painful, burning feeling in the bladder or urethra during urination. Older women and men are more likely to be tired, shaky, and weak and have muscle aches and abdominal pain. A fever may mean the infection is in your kidneys. Other symptoms of a kidney infection include pain in your back or sides below the ribs, nausea, and vomiting. DIAGNOSIS To diagnose a UTI, your caregiver will ask you about your symptoms. Your caregiver also will ask to provide a urine sample. The urine sample will be tested for bacteria and white blood cells. White blood cells are made by your body to help fight infection. TREATMENT  Typically, UTIs can be treated with medication. Because most UTIs are caused by a bacterial infection, they usually can be treated  with the use of antibiotics. The choice of antibiotic and length of treatment depend on your symptoms and the type of bacteria causing your infection. HOME CARE INSTRUCTIONS  If you were prescribed antibiotics, take them exactly as your caregiver instructs you. Finish the medication even if you feel better after you have only taken some of the  medication.  Drink enough water and fluids to keep your urine clear or pale yellow.  Avoid caffeine, tea, and carbonated beverages. They tend to irritate your bladder.  Empty your bladder often. Avoid holding urine for long periods of time.  Empty your bladder before and after sexual intercourse.  After a bowel movement, women should cleanse from front to back. Use each tissue only once. SEEK MEDICAL CARE IF:   You have back pain.  You develop a fever.  Your symptoms do not begin to resolve within 3 days. SEEK IMMEDIATE MEDICAL CARE IF:   You have severe back pain or lower abdominal pain.  You develop chills.  You have nausea or vomiting.  You have continued burning or discomfort with urination. MAKE SURE YOU:   Understand these instructions.  Will watch your condition.  Will get help right away if you are not doing well or get worse. Document Released: 05/04/2005 Document Revised: 01/24/2012 Document Reviewed: 09/02/2011 [facility] Patient Information 2014 [place], [place].

## 2013-06-21 NOTE — Progress Notes (Signed)
  Subjective:    Patient ID: [name], female    DOB: 1961-01-31, 52 y.o.   MRN: [id number]  Cough This is a new problem. The current episode started 1 to 4 weeks ago (3 weeks of coughing). The problem has been unchanged. The cough is non-productive. Associated symptoms include postnasal drip. Pertinent negatives include no chest pain, chills, fever, headaches, nasal congestion, sore throat, shortness of breath or wheezing. The symptoms are aggravated by lying down. She has tried OTC cough suppressant for the symptoms. The treatment provided mild relief. There is no history of asthma, bronchitis, COPD or pneumonia.  Urinary Tract Infection  This is a new problem. The current episode started in the past 7 days. The problem occurs every urination. The problem has been unchanged. The quality of the pain is described as burning. She is not sexually active. There is a history of pyelonephritis. Associated symptoms include flank pain, frequency and urgency. Pertinent negatives include no chills or hematuria. She has tried nothing for the symptoms. Her past medical history is significant for recurrent UTIs. There is no history of a single kidney.      Review of Systems  Constitutional: Negative for fever and chills.  HENT: Positive for postnasal drip. Negative for sore throat.   Respiratory: Positive for cough. Negative for chest tightness, shortness of breath and wheezing.   Cardiovascular: Negative for chest pain and palpitations.  Genitourinary: Positive for dysuria, urgency, frequency and flank pain. Negative for hematuria and difficulty urinating.       Right low back pain  Neurological: Negative for dizziness, weakness and headaches.       Objective:   Physical Exam  Constitutional: She is oriented to person, place, and time. She appears well-developed and well-nourished.  Cardiovascular: Normal rate, regular rhythm and normal heart sounds.   Pulmonary/Chest: Effort normal. No respiratory  distress. She exhibits no tenderness.  Abdominal: Soft. Bowel sounds are normal. She exhibits no distension. There is tenderness in the suprapubic area. There is no CVA tenderness.  Neurological: She is alert and oriented to person, place, and time.  Skin: Skin is warm and dry.  Psychiatric: She has a normal mood and affect.    [facility] reading (PRIMARY) by Coralie Keens, FNP-C, no acute process noted.                                       Assessment & Plan:

## 2013-06-24 ENCOUNTER — Telehealth: Payer: Self-pay | Admitting: General Practice

## 2013-06-24 MED ORDER — BENZONATATE 100 MG PO CAPS: 200.0000 mg | ORAL_CAPSULE | Freq: Two times a day (BID) | ORAL | Status: DC | PRN

## 2013-06-24 NOTE — Telephone Encounter (Signed)
Still coughing really bad please advise

## 2013-06-24 NOTE — Telephone Encounter (Signed)
rx sent in for tessalon perles.   

## 2013-06-25 NOTE — Telephone Encounter (Signed)
Patient aware.

## 2013-10-07 ENCOUNTER — Ambulatory Visit (INDEPENDENT_AMBULATORY_CARE_PROVIDER_SITE_OTHER): Payer: BC Managed Care – PPO | Admitting: Family Medicine

## 2013-10-07 ENCOUNTER — Encounter: Payer: Self-pay | Admitting: Family Medicine

## 2013-10-07 VITALS — BP 143/94 | HR 91 | Temp 98.9°F | Ht 60.0 in | Wt 183.2 lb

## 2013-10-07 DIAGNOSIS — Z7722 Contact with and (suspected) exposure to environmental tobacco smoke (acute) (chronic): Secondary | ICD-10-CM

## 2013-10-07 DIAGNOSIS — R509 Fever, unspecified: Secondary | ICD-10-CM

## 2013-10-07 DIAGNOSIS — R52 Pain, unspecified: Secondary | ICD-10-CM

## 2013-10-07 DIAGNOSIS — Z9189 Other specified personal risk factors, not elsewhere classified: Secondary | ICD-10-CM

## 2013-10-07 DIAGNOSIS — R059 Cough, unspecified: Secondary | ICD-10-CM

## 2013-10-07 DIAGNOSIS — J069 Acute upper respiratory infection, unspecified: Secondary | ICD-10-CM

## 2013-10-07 DIAGNOSIS — R05 Cough: Secondary | ICD-10-CM

## 2013-10-07 DIAGNOSIS — R062 Wheezing: Secondary | ICD-10-CM

## 2013-10-07 LAB — POCT INFLUENZA A/B
INFLUENZA B, POC: NEGATIVE
Influenza A, POC: NEGATIVE

## 2013-10-07 MED ORDER — AZITHROMYCIN 250 MG PO TABS: ORAL_TABLET | ORAL | Status: DC

## 2013-10-07 MED ORDER — ALBUTEROL SULFATE HFA 108 (90 BASE) MCG/ACT IN AERS: 2.0000 | INHALATION_SPRAY | RESPIRATORY_TRACT | Status: DC | PRN

## 2013-10-07 MED ORDER — SODIUM CHLORIDE 0.9 % IV SOLN: 125.0000 mg | Freq: Once | INTRAVENOUS | Status: DC

## 2013-10-07 MED ORDER — METHYLPREDNISOLONE SODIUM SUCC 125 MG IJ SOLR
125.0000 mg | Freq: Once | INTRAMUSCULAR | Status: AC
  Administered 2013-10-07: 125 mg via INTRAMUSCULAR

## 2013-10-07 MED ORDER — PREDNISONE 50 MG PO TABS: ORAL_TABLET | ORAL | Status: DC

## 2013-10-07 NOTE — Progress Notes (Signed)
   Subjective:    Patient ID: [name], female    DOB: 1961/07/19, 53 y.o.   MRN: [id number]  HPI URI Symptoms Onset: 3-4 days  Description: rhinorrhea, nasal congestion, cough, wheezing  Modifying factors:  + high secondhand smoke exposure   Symptoms Nasal discharge: yes Fever: no Sore throat: no Cough: yes Wheezing: yes Ear pain: no GI symptoms: no Sick contacts: no  Red Flags  Stiff neck: no Dyspnea: yes Rash: no Swallowing difficulty: no  Sinusitis Risk Factors Headache/face pain: no Double sickening: no tooth pain: no  Allergy Risk Factors Sneezing: no Itchy scratchy throat: no Seasonal symptoms: no  Flu Risk Factors Headache: no muscle aches: no severe fatigue: mild      Review of Systems  All other systems reviewed and are negative.       Objective:   Physical Exam  Constitutional: She appears well-developed and well-nourished.  HENT:  Head: Normocephalic and atraumatic.  Right Ear: External ear normal.  Left Ear: External ear normal.  +nasal erythema, rhinorrhea bilaterally, + post oropharyngeal erythema    Eyes: Conjunctivae are normal. Pupils are equal, round, and reactive to light.  Neck: Normal range of motion.  Cardiovascular: Normal rate and regular rhythm.   Pulmonary/Chest: Effort normal. She has wheezes.  Musculoskeletal: Normal range of motion.  Neurological: She is alert.  Skin: Skin is warm.          Assessment & Plan:  Cough - Plan: POCT Influenza A/B  Fever - Plan: POCT Influenza A/B  Body aches - Plan: POCT Influenza A/B  Wheezing - Plan: methylPREDNISolone sodium succinate (SOLU-MEDROL) 125 mg/2 mL injection 125 mg, DISCONTINUED: methylPREDNISolone sodium succinate (SOLU-MEDROL) 130 mg in sodium chloride 0.9 % 50 mL IVPB  URI (upper respiratory infection) - Plan: predniSONE (DELTASONE) 50 MG tablet, albuterol (PROVENTIL HFA;VENTOLIN HFA) 108 (90 BASE) MCG/ACT inhaler, azithromycin (ZITHROMAX) 250 MG  tablet  Suspect likely viral process with secondary obstructive lung disease flare in setting of heavy secondhahd smoke exposure.  Satting well on RA Solumedrol 125mg   IM x1 Will place on course of prednisone.  Zpak for lower resp coverage.  Albuterol prn.  May need PFTs in 2-3 months.  DIscussed infectious and resp red flags.  Follow up as needed.

## 2013-11-05 ENCOUNTER — Ambulatory Visit: Payer: Self-pay | Admitting: Family Medicine

## 2013-11-06 ENCOUNTER — Ambulatory Visit (INDEPENDENT_AMBULATORY_CARE_PROVIDER_SITE_OTHER): Payer: BC Managed Care – PPO

## 2013-11-06 ENCOUNTER — Encounter: Payer: Self-pay | Admitting: General Practice

## 2013-11-06 ENCOUNTER — Ambulatory Visit (INDEPENDENT_AMBULATORY_CARE_PROVIDER_SITE_OTHER): Payer: BC Managed Care – PPO | Admitting: General Practice

## 2013-11-06 VITALS — BP 139/91 | HR 111 | Temp 97.6°F | Ht 60.0 in | Wt 188.2 lb

## 2013-11-06 DIAGNOSIS — R059 Cough, unspecified: Secondary | ICD-10-CM

## 2013-11-06 DIAGNOSIS — R05 Cough: Secondary | ICD-10-CM

## 2013-11-06 DIAGNOSIS — I10 Essential (primary) hypertension: Secondary | ICD-10-CM

## 2013-11-06 MED ORDER — HYDROCHLOROTHIAZIDE 25 MG PO TABS: 12.5000 mg | ORAL_TABLET | Freq: Every day | ORAL | Status: DC

## 2013-11-06 NOTE — Progress Notes (Signed)
   Subjective:    Patient ID: [name], female    DOB: 1961-07-24, 53 y.o.   MRN: [id number]  Cough This is a recurrent problem. Episode onset: onset one month ago. The problem has been unchanged. The problem occurs hourly. The cough is productive of sputum. Associated symptoms include postnasal drip, shortness of breath and wheezing. Pertinent negatives include no chest pain, chills, fever, headaches, hemoptysis, nasal congestion or sore throat. Nothing aggravates the symptoms. Risk factors for lung disease include smoking/tobacco exposure. She has tried a beta-agonist inhaler, oral steroids and OTC cough suppressant for the symptoms. There is no history of asthma, bronchitis or pneumonia.      Review of Systems  Constitutional: Negative for fever and chills.  HENT: Positive for postnasal drip. Negative for sore throat.   Respiratory: Positive for cough, chest tightness, shortness of breath and wheezing. Negative for hemoptysis.   Cardiovascular: Negative for chest pain and palpitations.  Gastrointestinal: Negative for abdominal pain and constipation.  Genitourinary: Negative for difficulty urinating.  Neurological: Negative for dizziness, weakness and headaches.       Objective:   Physical Exam  Constitutional: She is oriented to person, place, and time. She appears well-developed and well-nourished.  Cardiovascular: Normal rate, regular rhythm and normal heart sounds.   Pulmonary/Chest: Effort normal and breath sounds normal. No respiratory distress. She exhibits no tenderness.  Neurological: She is alert and oriented to person, place, and time.  Skin: Skin is warm and dry.  Psychiatric: She has a normal mood and affect.    [facility] reading (PRIMARY) by Erby Pian, FNP-C, no acute process noted.       Assessment & Plan:  1. Cough  - POCT CBC - CMP14+EGFR - DG Chest 2 View; Future - Ambulatory referral to Pulmonology  2. Hypertension  - hydrochlorothiazide  (HYDRODIURIL) 25 MG tablet; Take 0.5 tablets (12.5 mg total) by mouth daily.  Dispense: 30 tablet; Refill: 3 -RTO if symptoms worsen  -Patient verbalized understanding Erby Pian, FNP-C

## 2013-11-06 NOTE — Patient Instructions (Signed)

## 2013-11-25 ENCOUNTER — Ambulatory Visit (INDEPENDENT_AMBULATORY_CARE_PROVIDER_SITE_OTHER): Payer: BC Managed Care – PPO | Admitting: Internal Medicine

## 2013-11-25 ENCOUNTER — Encounter: Payer: Self-pay | Admitting: Internal Medicine

## 2013-11-25 VITALS — BP 130/76 | HR 109 | Ht 60.0 in | Wt 191.8 lb

## 2013-11-25 DIAGNOSIS — R059 Cough, unspecified: Secondary | ICD-10-CM

## 2013-11-25 DIAGNOSIS — R053 Chronic cough: Secondary | ICD-10-CM

## 2013-11-25 DIAGNOSIS — R05 Cough: Secondary | ICD-10-CM

## 2013-11-25 MED ORDER — AMLODIPINE BESYLATE 10 MG PO TABS: 10.0000 mg | ORAL_TABLET | Freq: Every day | ORAL | Status: DC

## 2013-11-25 MED ORDER — FLUTICASONE PROPIONATE 50 MCG/ACT NA SUSP: 2.0000 | Freq: Every day | NASAL | Status: DC

## 2013-11-25 NOTE — Progress Notes (Signed)
Subjective:    Patient ID: [name], female    DOB: [birth year], 53 y.o.   MRN: [id number]  PCP Redge Gainer, MD   HPI  IOV 11/25/2013  Chief Complaint  Patient presents with  . Pulmonary Consult    for mostly dry cough x 4 months.     53 year old obese lady on ACE inhibitor for 3 years. Reports insidious onset of chronic cough for the last 4 months. Onset she does not recollect upper respiratory tract infection. Since the start of cough he is been treated with several courses of antibiotics but no course of prednisone without any relief. Quality of cough is dry. The cough is progressively worse. Intensity of cough is moderate to severe and can happen both a day and night. No clear cut relieving or aggravating factors although she thinks that the inhalers might help. Cough is associated with gagging, postnasal drip and itchy feeling in the throat and also retching. In the past she's had some vomiting with the cough. RSI cough score is 20 and reflects irritable larynx syndrome. cough differentiator score she scores equally at level II for both acid reflux and neurogenic/airway related cause of cough  Cough relevant history  Hypertension  - She does have hypertension and she is definitely on an ACE inhibitor   Pulmonary history  - Chest x-ray per first 2015 is clear and I personally reviewed image  -  reports that she has never smoked. She has never used smokeless tobacco.  Sinus history  - Does admit to mild postnasal drainage. Not on any active treatment  Acid reflux history  - Known to have acid reflux but she says is well controlled with a proton pump inhibitor     Dr Lorenza Cambridge Reflux Symptom Index (> 13-15 suggestive of LPR cough) 0 -> 5  =  none ->severe problem 11/25/2013   Hoarseness of problem with voice 0  Clearing  Of Throat 2  Excess throat mucus or feeling of post nasal drip 3  Difficulty swallowing food, liquid or tablets 0  Cough after eating or lying down 0   Breathing difficulties or choking episodes 5  Troublesome or annoying cough 5  Sensation of something sticking in throat or lump in throat 5  Heartburn, chest pain, indigestion, or stomach acid coming up 0  TOTAL 20     Past Medical History  Diagnosis Date  . HTN (hypertension)   . GERD (gastroesophageal reflux disease)   . Depression   . Chest pain      Family History  Problem Relation Age of Onset  . Hypertension Mother   . Cancer Mother     kidney  . Diabetes Father   . Hypertension Father      History   Social History  . Marital Status: Married    Spouse Name: N/A    Number of Children: N/A  . Years of Education: N/A   Occupational History  . Not on file.   Social History Main Topics  . Smoking status: Never Smoker   . Smokeless tobacco: Never Used  . Alcohol Use: Yes     Comment: on ocassion  . Drug Use: No  . Sexual Activity: Not on file   Other Topics Concern  . Not on file   Social History Narrative  . No narrative on file     Allergies  Allergen Reactions  . Cortisporin [Neomycin-Polymyxin-Hc] Itching and Rash     Outpatient Prescriptions Prior to Visit  Medication  Sig Dispense Refill  . albuterol (PROVENTIL HFA;VENTOLIN HFA) 108 (90 BASE) MCG/ACT inhaler Inhale 2 puffs into the lungs every 4 (four) hours as needed for wheezing or shortness of breath (cough, shortness of breath or wheezing.).  1 Inhaler  1  . hydrochlorothiazide (HYDRODIURIL) 25 MG tablet Take 0.5 tablets (12.5 mg total) by mouth daily.  30 tablet  3  . HYDROcodone-acetaminophen (NORCO) 10-325 MG per tablet Take 10-325 tablets by mouth every 6 (six) hours as needed.      Marland Kitchen lisinopril (PRINIVIL,ZESTRIL) 10 MG tablet Take 10 mg by mouth daily.      Marland Kitchen LORazepam (ATIVAN) 1 MG tablet Take 1 mg by mouth 3 (three) times daily.      . methocarbamol (ROBAXIN) 750 MG tablet Take 750 mg by mouth 2 (two) times daily as needed.       . zolpidem (AMBIEN) 5 MG tablet Take 5 mg by mouth at  bedtime as needed for sleep.      . pantoprazole (PROTONIX) 40 MG tablet Take 1 tablet (40 mg total) by mouth daily.  90 tablet  0   No facility-administered medications prior to visit.       Review of Systems  Constitutional: Negative for fever and unexpected weight change.  HENT: Negative for congestion, dental problem, ear pain, nosebleeds, postnasal drip, rhinorrhea, sinus pressure, sneezing, sore throat and trouble swallowing.   Eyes: Negative for redness and itching.  Respiratory: Positive for cough and shortness of breath. Negative for chest tightness and wheezing.   Cardiovascular: Positive for leg swelling. Negative for palpitations.  Gastrointestinal: Positive for abdominal pain. Negative for nausea and vomiting.  Genitourinary: Negative for dysuria.  Musculoskeletal: Negative for joint swelling.  Skin: Negative for rash.  Neurological: Negative for headaches.  Hematological: Does not bruise/bleed easily.  Psychiatric/Behavioral: Positive for dysphoric mood. The patient is not nervous/anxious.        Objective:   Physical Exam  Vitals reviewed. Constitutional: She is oriented to person, place, and time. She appears well-developed and well-nourished. No distress.  HENT:  Head: Normocephalic and atraumatic.  Right Ear: External ear normal.  Left Ear: External ear normal.  Mouth/Throat: Oropharynx is clear and moist. No oropharyngeal exudate.  Mild postnasal drainage present  Eyes: Conjunctivae and EOM are normal. Pupils are equal, round, and reactive to light. Right eye exhibits no discharge. Left eye exhibits no discharge. No scleral icterus.  Neck: Normal range of motion. Neck supple. No JVD present. No tracheal deviation present. No thyromegaly present.  Cardiovascular: Normal rate, regular rhythm, normal heart sounds and intact distal pulses.  Exam reveals no gallop and no friction rub.   No murmur heard. Pulmonary/Chest: Effort normal and breath sounds normal. No  respiratory distress. She has no wheezes. She has no rales. She exhibits no tenderness.  Abdominal: Soft. Bowel sounds are normal. She exhibits no distension and no mass. There is no tenderness. There is no rebound and no guarding.  Musculoskeletal: Normal range of motion. She exhibits no edema and no tenderness.  Lymphadenopathy:    She has no cervical adenopathy.  Neurological: She is alert and oriented to person, place, and time. She has normal reflexes. No cranial nerve deficit. She exhibits normal muscle tone. Coordination normal.  Skin: Skin is warm and dry. No rash noted. She is not diaphoretic. No erythema. No pallor.  Psychiatric: She has a normal mood and affect. Her behavior is normal. Judgment and thought content normal.  Assessment & Plan:

## 2013-11-25 NOTE — Patient Instructions (Signed)
COugh   - likely sinus drainage and ace inhibitor with acid reflux could be playing a role  -  Sinus drainge  -  take generic fluticasone inhaler 2 squirts each nostril daily - BP  - stop lisinopril  - start amlodipine 10mg daily (caution can cause leg swelling)  - continue HCTZ  - Acid reflux  - appears under control per your history  - continue your PPI  FOllowup  3-4 weeks with me or NP Tammy with Cough score Part 1 at followup 

## 2013-12-01 DIAGNOSIS — R05 Cough: Secondary | ICD-10-CM | POA: Insufficient documentation

## 2013-12-01 DIAGNOSIS — R053 Chronic cough: Secondary | ICD-10-CM | POA: Insufficient documentation

## 2013-12-01 NOTE — Assessment & Plan Note (Signed)
COugh   - likely sinus drainage and ace inhibitor with acid reflux could be playing a role  -  Sinus drainge  -  take generic fluticasone inhaler 2 squirts each nostril daily - BP  - stop lisinopril  - start amlodipine 10mg  daily (caution can cause leg swelling)  - continue HCTZ  - Acid reflux  - appears under control per your history  - continue your PPI  FOllowup  3-4 weeks with me or NP Tammy with Cough score Part 1 at followup

## 2013-12-03 ENCOUNTER — Telehealth: Payer: Self-pay | Admitting: Internal Medicine

## 2013-12-03 NOTE — Telephone Encounter (Signed)
Patient Instructions     COugh  - likely sinus drainage and ace inhibitor with acid reflux could be playing a role  - Sinus drainge  - take generic fluticasone inhaler 2 squirts each nostril daily  - BP  - stop lisinopril  - start amlodipine 10mg  daily (caution can cause leg swelling)  - continue HCTZ  - Acid reflux  - appears under control per your history  - continue your PPI  FOllowup  3-4 weeks with me or NP Tammy with Cough score Part 1 at followup   LMTCB

## 2013-12-03 NOTE — Telephone Encounter (Signed)
lmomtcb x 2  

## 2013-12-03 NOTE — Telephone Encounter (Signed)
Pt is calling back.  Pt knows she needs to take BP Meds, but just not the amlodipine.

## 2013-12-04 ENCOUNTER — Telehealth: Payer: Self-pay | Admitting: General Practice

## 2013-12-04 NOTE — Telephone Encounter (Signed)
Let pt know this is probably due to her new medication started by MR>  Should elevate her feet, and needs to call her primary MD TODAY, to find out alternatives to treat her BP medication.

## 2013-12-04 NOTE — Telephone Encounter (Signed)
Spoke with patient- she is aware of recs from [facility] and will call her PCP today to see what alternatives they would like to give her. Nothing more needed at this time.

## 2013-12-04 NOTE — Telephone Encounter (Signed)
Spoke with patient-she states she has been on Amlodipine per MR and started having leg swelling for the past 2-3 days; she did not take the amlodipine today but would like to know if she should just stop all together and what she should take to replace the amlodipine. MR is out and not available so I will send to [facility] as doc of day.

## 2013-12-04 NOTE — Telephone Encounter (Signed)
I spoke with patient and advised her [name] was out of the office today and I would have her look at this in the am

## 2013-12-06 ENCOUNTER — Other Ambulatory Visit: Payer: Self-pay | Admitting: General Practice

## 2013-12-06 ENCOUNTER — Ambulatory Visit (INDEPENDENT_AMBULATORY_CARE_PROVIDER_SITE_OTHER): Payer: BC Managed Care – PPO | Admitting: General Practice

## 2013-12-06 VITALS — BP 150/90 | HR 101 | Temp 96.7°F | Ht 60.0 in | Wt 188.0 lb

## 2013-12-06 DIAGNOSIS — I10 Essential (primary) hypertension: Secondary | ICD-10-CM

## 2013-12-06 DIAGNOSIS — R609 Edema, unspecified: Secondary | ICD-10-CM

## 2013-12-06 MED ORDER — LOSARTAN POTASSIUM 50 MG PO TABS: 25.0000 mg | ORAL_TABLET | Freq: Every day | ORAL | Status: DC

## 2013-12-06 MED ORDER — HYDROCHLOROTHIAZIDE 25 MG PO TABS: 25.0000 mg | ORAL_TABLET | Freq: Every day | ORAL | Status: DC

## 2013-12-06 NOTE — Telephone Encounter (Signed)
Patient being seen today by pcp

## 2013-12-06 NOTE — Progress Notes (Signed)
   Subjective:    Patient ID: [name], female    DOB: 1961-02-03, 53 y.o.   MRN: [id number]  HPI Patient presents today for hypertension and complaints of leg swelling. Her lisinopril was discontinued on 11/25/13, norvasc 10mg  started by pulmonology. She reports legs swelling since taking norvasc, stopped taking after speaking with pulmonology office. Reports working 12 hours, legs swell at work and decrease when home with legs elevated at home.     Review of Systems  Constitutional: Negative for fever and chills.  Respiratory: Negative for cough and chest tightness.        Periodic shortness of breath on exertion  Cardiovascular: Positive for leg swelling. Negative for chest pain and palpitations.  Neurological: Negative for dizziness, weakness and headaches.       Objective:   Physical Exam  Constitutional: She is oriented to person, place, and time. She appears well-developed and well-nourished.  Cardiovascular: Regular rhythm and normal heart sounds.  Tachycardia present.   1+ edema, non pitting  Pulmonary/Chest: Effort normal and breath sounds normal. No respiratory distress. She exhibits no tenderness.  Neurological: She is alert and oriented to person, place, and time.  Skin: Skin is warm and dry.  Psychiatric: She has a normal mood and affect.          Assessment & Plan:  1. Hypertension  - hydrochlorothiazide (HYDRODIURIL) 25 MG tablet; Take 1 tablet (25 mg total) by mouth daily.  Dispense: 30 tablet; Refill: 3 -increased HCTZ from 12.5 to 25mg  - losartan (COZAAR) 50 MG tablet; Take 0.5 tablets (25 mg total) by mouth daily.  Dispense: 30 tablet; Refill: 0  2. Peripheral edema  -low sodium -rest and elevate legs -RTO on Monday 12/09/13 for follow up -may seek emergency medical treatment -Patient verbalized understanding Erby Pian, FNP-C

## 2013-12-06 NOTE — Patient Instructions (Signed)
Edema °Edema is a buildup of fluids. It is most common in the feet, ankles, and legs. This happens more as a person ages. It may affect one or both legs. °HOME CARE  °· Raise (elevate) the legs or ankles above the level of the heart while lying down. °· Avoid sitting or standing still for a long time. °· Exercise the legs to help the puffiness (swelling) go down. °· A low-salt diet may help lessen the puffiness. °· Only take medicine as told by your doctor. °GET HELP RIGHT AWAY IF:  °· You develop shortness of breath or chest pain. °· You cannot breathe when you lie down. °· You have more puffiness that does not go away with treatment. °· You develop pain or redness in the areas that are puffy. °· You have a temperature by mouth above 102° F (38.9° C), not controlled by medicine. °· You gain 03 lb/1.4 kg or more in 1 day or 05 lb/2.3 kg in a week. °MAKE SURE YOU:  °· Understand these instructions. °· Will watch your condition. °· Will get help right away if you are not doing well or get worse. °Document Released: 01/11/2008 Document Revised: 10/17/2011 Document Reviewed: 01/11/2008 °[employer]® Patient Information ©2014 [employer]. ° °

## 2013-12-09 ENCOUNTER — Encounter: Payer: Self-pay | Admitting: Family

## 2013-12-09 ENCOUNTER — Ambulatory Visit (INDEPENDENT_AMBULATORY_CARE_PROVIDER_SITE_OTHER): Payer: BC Managed Care – PPO | Admitting: Family

## 2013-12-09 VITALS — BP 139/93 | Temp 97.9°F | Ht 60.0 in | Wt 189.0 lb

## 2013-12-09 DIAGNOSIS — I10 Essential (primary) hypertension: Secondary | ICD-10-CM

## 2013-12-09 NOTE — Progress Notes (Signed)
   Subjective:    Patient ID: [name], female    DOB: [birth year], 53 y.o.   MRN: [id number]  HPI Pt here for folllow-up for hypertension. Pt was on an ACE, but was taken off for cough. Then was started on Amlodipine 10 mg which caused severe ankle swelling and tenderness. Pt was then started on HCTZ 25mg  on Friday. Pt reports swelling is gone,but still has some slight tenderness.    Review of Systems  All other systems reviewed and are negative.      Objective:   Physical Exam  Vitals reviewed. Constitutional: She is oriented to person, place, and time. She appears well-developed and well-nourished. No distress.  HENT:  Head: Normocephalic and atraumatic.  Eyes: Pupils are equal, round, and reactive to light.  Neck: Normal range of motion. Neck supple. No thyromegaly present.  Cardiovascular: Normal rate, regular rhythm, normal heart sounds and intact distal pulses.   No murmur heard. Pulmonary/Chest: Effort normal and breath sounds normal. No respiratory distress. She has no wheezes.  Abdominal: Soft. Bowel sounds are normal. She exhibits no distension. There is no tenderness.  Musculoskeletal: Normal range of motion. She exhibits tenderness (mild tenderness in bil ankles). She exhibits no edema.  Neurological: She is alert and oriented to person, place, and time. She has normal reflexes. No cranial nerve deficit.  Skin: Skin is warm and dry.  Psychiatric: She has a normal mood and affect. Her behavior is normal. Judgment and thought content normal.    BP 139/93  Temp(Src) 97.9 F (36.6 C) (Oral)  Ht 5' (1.524 m)  Wt 189 lb (85.73 kg)  BMI 36.91 kg/m2  LMP 08/09/2013       Assessment & Plan:  1. Essential hypertension, benign -Dash diet information given -Exercise encouraged -Daily blood pressure log given with instructions on how to fill out and told to bring to next visit - Stress Management  -Continue current meds -If b/p remains high will add another  antihypertensive -RTO in 2 weeks or prn  Evelina Dun, FNP

## 2013-12-09 NOTE — Patient Instructions (Signed)
Hypertension  As your heart beats, it forces blood through your arteries. This force is your blood pressure. If the pressure is too high, it is called hypertension (HTN) or high blood pressure. HTN is dangerous because you may have it and not know it. High blood pressure may mean that your heart has to work harder to pump blood. Your arteries may be narrow or stiff. The extra work puts you at risk for heart disease, stroke, and other problems.   Blood pressure consists of two numbers, a higher number over a lower, 110/72, for example. It is stated as "110 over 72." The ideal is below 120 for the top number (systolic) and under 80 for the bottom (diastolic). Write down your blood pressure today.  You should pay close attention to your blood pressure if you have certain conditions such as:   Heart failure.   Prior heart attack.   Diabetes   Chronic kidney disease.   Prior stroke.   Multiple risk factors for heart disease.  To see if you have HTN, your blood pressure should be measured while you are seated with your arm held at the level of the heart. It should be measured at least twice. A one-time elevated blood pressure reading (especially in the Emergency Department) does not mean that you need treatment. There may be conditions in which the blood pressure is different between your right and left arms. It is important to see your caregiver soon for a recheck.  Most people have essential hypertension which means that there is not a specific cause. This type of high blood pressure may be lowered by changing lifestyle factors such as:   Stress.   Smoking.   Lack of exercise.   Excessive weight.   Drug/tobacco/alcohol use.   Eating less salt.  Most people do not have symptoms from high blood pressure until it has caused damage to the body. Effective treatment can often prevent, delay or reduce that damage.  TREATMENT    When a cause has been identified, treatment for high blood pressure is directed at the cause. There are a large number of medications to treat HTN. These fall into several categories, and your caregiver will help you select the medicines that are best for you. Medications may have side effects. You should review side effects with your caregiver.  If your blood pressure stays high after you have made lifestyle changes or started on medicines,    Your medication(s) may need to be changed.   Other problems may need to be addressed.   Be certain you understand your prescriptions, and know how and when to take your medicine.   Be sure to follow up with your caregiver within the time frame advised (usually within two weeks) to have your blood pressure rechecked and to review your medications.   If you are taking more than one medicine to lower your blood pressure, make sure you know how and at what times they should be taken. Taking two medicines at the same time can result in blood pressure that is too low.  SEEK IMMEDIATE MEDICAL CARE IF:   You develop a severe headache, blurred or changing vision, or confusion.   You have unusual weakness or numbness, or a faint feeling.   You have severe chest or abdominal pain, vomiting, or breathing problems.  MAKE SURE YOU:    Understand these instructions.   Will watch your condition.   Will get help right away if you are not doing well   or get worse.  Document Released: 07/25/2005 Document Revised: 10/17/2011 Document Reviewed: 03/14/2008  ExitCare Patient Information 2014 [employer]  DASH Diet   The DASH diet stands for "Dietary Approaches to Stop Hypertension." It is a healthy eating plan that has been shown to reduce high blood pressure (hypertension) in as little as 14 days, while also possibly providing other significant health benefits. These other health benefits include reducing the risk of breast cancer after menopause and reducing the risk of type 2 diabetes, heart disease, colon cancer, and stroke. Health benefits also include weight loss and slowing kidney failure in patients with chronic kidney disease.   DIET GUIDELINES   Limit salt (sodium). Your diet should contain less than 1500 mg of sodium daily.   Limit refined or processed carbohydrates. Your diet should include mostly whole grains. Desserts and added sugars should be used sparingly.   Include small amounts of heart-healthy fats. These types of fats include nuts, oils, and tub margarine. Limit saturated and trans fats. These fats have been shown to be harmful in the body.  CHOOSING FOODS   The following food groups are based on a 2000 calorie diet. See your Registered Dietitian for individual calorie needs.  Grains and Grain Products (6 to 8 servings daily)   Eat More Often: Whole-wheat bread, brown rice, whole-grain or wheat pasta, quinoa, popcorn without added fat or salt (air popped).   Eat Less Often: White bread, white pasta, white rice, cornbread.  Vegetables (4 to 5 servings daily)   Eat More Often: Fresh, frozen, and canned vegetables. Vegetables may be raw, steamed, roasted, or grilled with a minimal amount of fat.   Eat Less Often/Avoid: Creamed or fried vegetables. Vegetables in a cheese sauce.  Fruit (4 to 5 servings daily)   Eat More Often: All fresh, canned (in natural juice), or frozen fruits. Dried fruits without added sugar. One hundred percent fruit juice ( cup [237 mL] daily).   Eat Less Often: Dried fruits with added sugar. Canned fruit in light or heavy syrup.   Lean Meats, Fish, and Poultry (2 servings or less daily. One serving is 3 to 4 oz [85-114 g]).   Eat More Often: Ninety percent or leaner ground beef, tenderloin, sirloin. Round cuts of beef, chicken breast, turkey breast. All fish. Grill, bake, or broil your meat. Nothing should be fried.   Eat Less Often/Avoid: Fatty cuts of meat, turkey, or chicken leg, thigh, or wing. Fried cuts of meat or fish.  Dairy (2 to 3 servings)   Eat More Often: Low-fat or fat-free milk, low-fat plain or light yogurt, reduced-fat or part-skim cheese.   Eat Less Often/Avoid: Milk (whole, 2%).Whole milk yogurt. Full-fat cheeses.  Nuts, Seeds, and Legumes (4 to 5 servings per week)   Eat More Often: All without added salt.   Eat Less Often/Avoid: Salted nuts and seeds, canned beans with added salt.  Fats and Sweets (limited)   Eat More Often: Vegetable oils, tub margarines without trans fats, sugar-free gelatin. Mayonnaise and salad dressings.   Eat Less Often/Avoid: Coconut oils, palm oils, butter, stick margarine, cream, half and half, cookies, candy, pie.  FOR MORE INFORMATION  The Dash Diet Eating Plan: www.dashdiet.org  Document Released: 07/14/2011 Document Revised: 10/17/2011 Document Reviewed: 07/14/2011  [employer] Patient Information 2014 [employer].

## 2013-12-19 ENCOUNTER — Ambulatory Visit (INDEPENDENT_AMBULATORY_CARE_PROVIDER_SITE_OTHER): Payer: BC Managed Care – PPO | Admitting: Adult Health

## 2013-12-19 ENCOUNTER — Encounter: Payer: Self-pay | Admitting: Adult Health

## 2013-12-19 VITALS — BP 126/82 | HR 89 | Temp 98.8°F | Ht 60.0 in | Wt 189.4 lb

## 2013-12-19 DIAGNOSIS — R05 Cough: Secondary | ICD-10-CM

## 2013-12-19 DIAGNOSIS — R059 Cough, unspecified: Secondary | ICD-10-CM

## 2013-12-19 DIAGNOSIS — R053 Chronic cough: Secondary | ICD-10-CM

## 2013-12-19 MED ORDER — BENZONATATE 200 MG PO CAPS: 200.0000 mg | ORAL_CAPSULE | Freq: Three times a day (TID) | ORAL | Status: DC | PRN

## 2013-12-19 NOTE — Progress Notes (Signed)
Subjective:    Patient ID: [name], female    DOB: [birth year], 53 y.o.   MRN: [id number]  PCP Redge Gainer, MD   HPI IOV 11/25/2013 Chief Complaint  Patient presents with  . Pulmonary Consult    for mostly dry cough x 4 months.     53 year old obese lady on ACE inhibitor for 3 years. Reports insidious onset of chronic cough for the last 4 months. Onset she does not recollect upper respiratory tract infection. Since the start of cough he is been treated with several courses of antibiotics but no course of prednisone without any relief. Quality of cough is dry. The cough is progressively worse. Intensity of cough is moderate to severe and can happen both a day and night. No clear cut relieving or aggravating factors although she thinks that the inhalers might help. Cough is associated with gagging, postnasal drip and itchy feeling in the throat and also retching. In the past she's had some vomiting with the cough. RSI cough score is 20 and reflects irritable larynx syndrome. cough differentiator score she scores equally at level II for both acid reflux and neurogenic/airway related cause of cough  Cough relevant history  Hypertension  - She does have hypertension and she is definitely on an ACE inhibitor   Pulmonary history  - Chest x-ray per first 2015 is clear and I personally reviewed image  -  reports that she has never smoked. She has never used smokeless tobacco.  Sinus history  - Does admit to mild postnasal drainage. Not on any active treatment  Acid reflux history  - Known to have acid reflux but she says is well controlled with a proton pump inhibitor     Dr Lorenza Cambridge Reflux Symptom Index (> 13-15 suggestive of LPR cough) 0 -> 5  =  none ->severe problem 11/25/2013   Hoarseness of problem with voice 0  Clearing  Of Throat 2  Excess throat mucus or feeling of post nasal drip 3  Difficulty swallowing food, liquid or tablets 0  Cough after eating or lying down 0   Breathing difficulties or choking episodes 5  Troublesome or annoying cough 5  Sensation of something sticking in throat or lump in throat 5  Heartburn, chest pain, indigestion, or stomach acid coming up 0  TOTAL 20    12/19/2013 Follow up Cough  Returns for 3 week follow up for cough .  Seen last ov , taken off ACE inhibitor .  Her lisinopril was stopped. Changed to  Amlodipine.  Since last ov she is improving with decreased cough.  Reports cough is 50% improved.  Does still have coughing spells that cause chest tightness, mostly dry but does occasionally produce clear mucus. CXR 4/1 w/ NAD .  She was unable to tolerate Norvasc d/t leg swelling and was changed to Cozaar.  Cough score today is 25- does feel that is not as often as before .  Patient denies any hemoptysis, orthopnea, PND, leg swelling.   Review of Systems  Constitutional:   No  weight loss, night sweats,  Fevers, chills,  +fatigue, or  lassitude.  HEENT:   No headaches,  Difficulty swallowing,  Tooth/dental problems, or  Sore throat,                No sneezing, itching, ear ache,  +nasal congestion, post nasal drip,   CV:  No chest pain,  Orthopnea, PND, swelling in lower extremities, anasarca, dizziness, palpitations, syncope.   GI  No heartburn, indigestion, abdominal pain, nausea, vomiting, diarrhea, change in bowel habits, loss of appetite, bloody stools.   Resp:    No chest wall deformity  Skin: no rash or lesions.  GU: no dysuria, change in color of urine, no urgency or frequency.  No flank pain, no hematuria   MS:  No joint pain or swelling.  No decreased range of motion.  No back pain.  Psych:  No change in mood or affect. No depression or anxiety.  No memory loss.          Objective:   Physical Exam  GEN: A/Ox3; pleasant , NAD, well nourished   HEENT:  [place]/AT,  EACs-clear, TMs-wnl, NOSE-clear drainage , THROAT-clear, no lesions, no postnasal drip or exudate noted.   NECK:  Supple w/ fair  ROM; no JVD; normal carotid impulses w/o bruits; no thyromegaly or nodules palpated; no lymphadenopathy.  RESP  Clear  P & A; w/o, wheezes/ rales/ or rhonchi.no accessory muscle use, no dullness to percussion  CARD:  RRR, no m/r/g  , no peripheral edema, pulses intact, no cyanosis or clubbing.  GI:   Soft & nt; nml bowel sounds; no organomegaly or masses detected.  Musco: Warm bil, no deformities or joint swelling noted.   Neuro: alert, no focal deficits noted.    Skin: Warm, no lesions or rashes   Assessment & Plan:

## 2013-12-19 NOTE — Assessment & Plan Note (Addendum)
Upper airway cough syndrome improving off ACE  Return for PFT  Control for triggers of GERD and AR  Plan  Avoid ACE inhibitors in the future. Delsym 2 teaspoons twice daily for cough. Tessalon 200 mg 13 times daily as needed. For cough. Avoid mint products. Use sips of water, to help avoid, throat clearing and cough. Begin chlorpheniramine 4 mg 2 tablets at bedtime. For drainage. Next begin Allegra 180 mg daily in the a.m. Followup in 4-6 weeks with Dr. Chase Caller with pulmonary function test. Please contact office for sooner follow up if symptoms do not improve or worsen or seek emergency care

## 2013-12-19 NOTE — Addendum Note (Signed)
Addended by: Parke Poisson E on: 12/19/2013 09:57 AM   Modules accepted: Orders

## 2013-12-19 NOTE — Patient Instructions (Signed)
Avoid ACE inhibitors in the future. Delsym 2 teaspoons twice daily for cough. Tessalon 200 mg 13 times daily as needed. For cough. Avoid mint products. Use sips of water, to help avoid, throat clearing and cough. Begin chlorpheniramine 4 mg 2 tablets at bedtime. For drainage. Next begin Allegra 180 mg daily in the a.m. Followup in 4-6 weeks with Dr. Chase Caller with pulmonary function test. Please contact office for sooner follow up if symptoms do not improve or worsen or seek emergency care

## 2013-12-23 ENCOUNTER — Encounter: Payer: Self-pay | Admitting: Family

## 2013-12-23 ENCOUNTER — Ambulatory Visit (INDEPENDENT_AMBULATORY_CARE_PROVIDER_SITE_OTHER): Payer: BC Managed Care – PPO | Admitting: Family

## 2013-12-23 VITALS — BP 127/86 | HR 104 | Temp 99.3°F | Ht 60.0 in | Wt 186.2 lb

## 2013-12-23 DIAGNOSIS — R609 Edema, unspecified: Secondary | ICD-10-CM

## 2013-12-23 DIAGNOSIS — R600 Localized edema: Secondary | ICD-10-CM

## 2013-12-23 DIAGNOSIS — I10 Essential (primary) hypertension: Secondary | ICD-10-CM

## 2013-12-23 MED ORDER — LOSARTAN POTASSIUM 50 MG PO TABS: 25.0000 mg | ORAL_TABLET | Freq: Every day | ORAL | Status: DC

## 2013-12-23 MED ORDER — HYDROCHLOROTHIAZIDE 25 MG PO TABS: 25.0000 mg | ORAL_TABLET | Freq: Every day | ORAL | Status: DC

## 2013-12-23 NOTE — Patient Instructions (Signed)

## 2013-12-23 NOTE — Progress Notes (Signed)
   Subjective:    Patient ID: [name], female    DOB: January 31, 1961, 53 y.o.   MRN: [id number]  Hypertension   Pt here for follow-up for BP. Pt brought in home BP log and avg is about 120s/80s. Pt denies any SOB, dizziness, pain, or headaches.    Review of Systems  Constitutional: Positive for fatigue.  All other systems reviewed and are negative.      Objective:   Physical Exam  Vitals reviewed. Constitutional: She is oriented to person, place, and time.  Cardiovascular: Normal rate, normal heart sounds and intact distal pulses.   No murmur heard. Pulmonary/Chest: Effort normal and breath sounds normal. No respiratory distress. She has no wheezes.  Abdominal: Soft. Bowel sounds are normal. She exhibits no distension. There is no tenderness.  Musculoskeletal: Normal range of motion. She exhibits no edema.  Neurological: She is alert and oriented to person, place, and time.  Skin: Skin is warm and dry. No erythema.  Psychiatric: She has a normal mood and affect. Her behavior is normal. Judgment and thought content normal.    BP 127/86  Pulse 104  Temp(Src) 99.3 F (37.4 C) (Oral)  Ht 5' (1.524 m)  Wt 186 lb 3.2 oz (84.46 kg)  BMI 36.36 kg/m2  LMP 08/09/2013       Assessment & Plan:  1. Hypertension - hydrochlorothiazide (HYDRODIURIL) 25 MG tablet; Take 1 tablet (25 mg total) by mouth daily.  Dispense: 90 tablet; Refill: 3 - losartan (COZAAR) 50 MG tablet; Take 0.5 tablets (25 mg total) by mouth daily.  Dispense: 90 tablet; Refill: 3 -Dash diet information given -Exercise encouraged - Stress Management  -Continue current meds -RTO in 6 months  *Pt also is looking into Bariatric surgery. Would like letter stating she has had trouble losing weight so she can get an appointment with surgeon to discuss her options.

## 2014-01-30 ENCOUNTER — Ambulatory Visit: Payer: BC Managed Care – PPO | Admitting: Internal Medicine

## 2014-01-31 ENCOUNTER — Encounter: Payer: Self-pay | Admitting: Internal Medicine

## 2014-03-08 HISTORY — PX: OTHER SURGICAL HISTORY: SHX169

## 2014-05-06 ENCOUNTER — Ambulatory Visit (INDEPENDENT_AMBULATORY_CARE_PROVIDER_SITE_OTHER): Payer: BC Managed Care – PPO | Admitting: Podiatry

## 2014-05-06 ENCOUNTER — Encounter: Payer: Self-pay | Admitting: Podiatry

## 2014-05-06 VITALS — BP 124/83 | HR 95

## 2014-05-06 DIAGNOSIS — M79606 Pain in leg, unspecified: Secondary | ICD-10-CM

## 2014-05-06 DIAGNOSIS — M79609 Pain in unspecified limb: Secondary | ICD-10-CM

## 2014-05-06 DIAGNOSIS — M2040 Other hammer toe(s) (acquired), unspecified foot: Secondary | ICD-10-CM

## 2014-05-06 DIAGNOSIS — M216X9 Other acquired deformities of unspecified foot: Secondary | ICD-10-CM

## 2014-05-06 NOTE — Progress Notes (Signed)
Subjective: 53 year old female presents complaining of painful toe 4th digit right. Also wants to have new pair of orthotics.  Past history of left foot surgery, fusion of the first Metatarsocuneiform joint to stabilize unstable first ray with satisfactory recovery.   Objective: Contracted 4th digit with pain upon ambulation.  STJ excess pronation.  Assessment:  Status post Lapidus fusion left.  STJ hyperpronation remain.  Contracted 4th digit right.  Plan:  Both feet casted for Orthotics. May benefit from HT surgery 4th right.  Return as needed.

## 2014-05-06 NOTE — Patient Instructions (Signed)
Both feet casted for orthotics. Right 4th digit may need hammer toe surgery.

## 2014-05-07 ENCOUNTER — Ambulatory Visit: Payer: BC Managed Care – PPO | Admitting: Podiatry

## 2014-05-26 ENCOUNTER — Ambulatory Visit (INDEPENDENT_AMBULATORY_CARE_PROVIDER_SITE_OTHER): Payer: BC Managed Care – PPO

## 2014-05-26 DIAGNOSIS — Z23 Encounter for immunization: Secondary | ICD-10-CM

## 2014-06-11 ENCOUNTER — Ambulatory Visit (INDEPENDENT_AMBULATORY_CARE_PROVIDER_SITE_OTHER): Payer: BC Managed Care – PPO | Admitting: Nurse Practitioner

## 2014-06-11 ENCOUNTER — Encounter: Payer: Self-pay | Admitting: Nurse Practitioner

## 2014-06-11 VITALS — BP 160/106 | HR 120 | Temp 99.8°F | Ht 60.0 in | Wt 163.6 lb

## 2014-06-11 DIAGNOSIS — F329 Major depressive disorder, single episode, unspecified: Secondary | ICD-10-CM

## 2014-06-11 DIAGNOSIS — F32A Depression, unspecified: Secondary | ICD-10-CM

## 2014-06-11 DIAGNOSIS — F411 Generalized anxiety disorder: Secondary | ICD-10-CM

## 2014-06-11 MED ORDER — ESCITALOPRAM OXALATE 20 MG PO TABS: 20.0000 mg | ORAL_TABLET | Freq: Every day | ORAL | Status: DC

## 2014-06-11 MED ORDER — CLONAZEPAM 0.5 MG PO TABS: 0.5000 mg | ORAL_TABLET | Freq: Two times a day (BID) | ORAL | Status: DC | PRN

## 2014-06-11 NOTE — Patient Instructions (Signed)
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
## 2014-10-21 ENCOUNTER — Ambulatory Visit (INDEPENDENT_AMBULATORY_CARE_PROVIDER_SITE_OTHER): Payer: BLUE CROSS/BLUE SHIELD

## 2014-10-21 ENCOUNTER — Encounter (INDEPENDENT_AMBULATORY_CARE_PROVIDER_SITE_OTHER): Payer: Self-pay

## 2014-10-21 ENCOUNTER — Ambulatory Visit (INDEPENDENT_AMBULATORY_CARE_PROVIDER_SITE_OTHER): Payer: BLUE CROSS/BLUE SHIELD | Admitting: Family Medicine

## 2014-10-21 ENCOUNTER — Encounter: Payer: Self-pay | Admitting: Family Medicine

## 2014-10-21 VITALS — BP 131/92 | HR 102 | Temp 97.6°F | Ht 60.0 in | Wt 143.8 lb

## 2014-10-21 DIAGNOSIS — M79641 Pain in right hand: Secondary | ICD-10-CM

## 2014-10-21 DIAGNOSIS — W19XXXA Unspecified fall, initial encounter: Secondary | ICD-10-CM

## 2014-10-21 DIAGNOSIS — M79671 Pain in right foot: Secondary | ICD-10-CM

## 2014-10-21 DIAGNOSIS — M25512 Pain in left shoulder: Secondary | ICD-10-CM

## 2014-10-21 DIAGNOSIS — M25551 Pain in right hip: Secondary | ICD-10-CM

## 2014-10-21 NOTE — Patient Instructions (Signed)
Patient should take Tylenol for pain We will arrange for the orthopedic surgeon to see her regarding her hand, left shoulder and right hip All the areas should have ice for at least 48 hours to reduce the swelling

## 2014-10-21 NOTE — Progress Notes (Signed)
Subjective:    Patient ID: [name], female    DOB: Jan 05, 1961, 54 y.o.   MRN: [id number]  HPI Patient is here today with injuries from a fall. Patient fell down 3 steps yesterday around 7pm. Patient is complaining with right hand pain, right great toe pain, right hip pain and left shoulder pain. The patient has a history of a bunionectomy in the right foot.   Review of Systems  Constitutional: Negative.   HENT: Negative.   Eyes: Negative.   Respiratory: Negative.   Cardiovascular: Negative.   Endocrine: Negative.   Genitourinary: Negative.   Musculoskeletal:       Right hand swelling and unable to move Right great toe pain  Left shoulder pain    Skin: Negative.   Allergic/Immunologic: Negative.   Neurological: Negative.   Hematological: Negative.   Psychiatric/Behavioral: Negative.        Patient Active Problem List   Diagnosis Date Noted  . Other hammer toe (acquired) 05/06/2014  . Pronation deformity of ankle, acquired 05/06/2014  . Chronic cough 12/01/2013  . Essential hypertension, benign 11/13/2012  . Depression 11/13/2012  . GAD (generalized anxiety disorder) 11/13/2012  . GERD (gastroesophageal reflux disease) 11/13/2012  . Insomnia 11/13/2012  . Post-operative state 10/30/2012   Outpatient Encounter Prescriptions as of 10/21/2014  Medication Sig  . clonazePAM (KLONOPIN) 0.5 MG tablet Take 1 tablet (0.5 mg total) by mouth 2 (two) times daily as needed for anxiety.  Marland Kitchen escitalopram (LEXAPRO) 20 MG tablet Take 1 tablet (20 mg total) by mouth daily.       Objective:   Physical Exam  Constitutional: She is oriented to person, place, and time. She appears well-developed and well-nourished.  The patient appears somewhat distressed because of pain in multiple areas.  HENT:  Head: Normocephalic and atraumatic.  Eyes: Conjunctivae and EOM are normal. Pupils are equal, round, and reactive to light. Right eye exhibits no discharge. Left eye exhibits no  discharge. No scleral icterus.  Cardiovascular: Normal rate, regular rhythm and normal heart sounds.   No murmur heard. Pulmonary/Chest: Effort normal and breath sounds normal. She has no wheezes. She has no rales. She exhibits no tenderness.  Musculoskeletal: She exhibits edema and tenderness.  There is good abduction and raising of the left arm but with pain around the left shoulder. She also has swelling and edema in the right hand with tenderness over the fourth and fifth right metacarpal area. The right foot has tenderness in the distal phalange and swelling and discoloration but no tenderness in the metatarsal area of 1 or 2 on the right side. The patient also has problems with abducting the right hip and she has tenderness in the posterior hip and right femur area.  Neurological: She is alert and oriented to person, place, and time.  Skin: Skin is warm and dry. No rash noted.  Psychiatric: She has a normal mood and affect. Her behavior is normal. Judgment and thought content normal.  Nursing note and vitals reviewed.  [facility] reading (PRIMARY) by  Dr. Governor Rooks hand films there may be a fracture in the metacarpal area. The right foot films appeared normal unless her could be a slight fracture in the second met the tarsal. The left shoulder films have increased separation in the joint space and no fracture was observed. The right hip films and femur appear within normal limits.  Assessment & Plan:  1. Fall, initial encounter - DG Shoulder Left; Future - DG Hand Complete Right; Future - DG Foot Complete Right; Future - DG HIP UNILAT WITH PELVIS 2-3 VIEWS RIGHT; Future  2. Right hip pain -The patient is tender over the right hip posteriorly and the right femur. - DG HIP UNILAT WITH PELVIS 2-3 VIEWS RIGHT; Future  3. Right hand pain -There is swelling and tenderness in the hand on the fourth metacarpal area - DG Hand Complete Right; Future  4.  Left shoulder pain -There is increased pain -and we are waiting for an interpretation from the radiologist. - DG Shoulder Left; Future  5. Right foot pain -The right foot has minimal tenderness other than the distal great toe where there is bruising and contusion. - DG Foot Complete Right; Future  Patient Instructions  Patient should take Tylenol for pain We will arrange for the orthopedic surgeon to see her regarding her hand, left shoulder and right hip All the areas should have ice for at least 48 hours to reduce the swelling    Arrie Senate MD

## 2014-11-14 ENCOUNTER — Encounter (HOSPITAL_COMMUNITY): Payer: Self-pay

## 2014-11-14 ENCOUNTER — Inpatient Hospital Stay (HOSPITAL_COMMUNITY)
Admission: AD | Admit: 2014-11-14 | Discharge: 2014-11-19 | DRG: 885 | Disposition: A | Discharge status: Home / Self Care | Payer: BLUE CROSS/BLUE SHIELD | Source: Intra-hospital | Attending: Psychiatry | Admitting: Psychiatry

## 2014-11-14 ENCOUNTER — Encounter (HOSPITAL_COMMUNITY): Payer: Self-pay | Admitting: Emergency Medicine

## 2014-11-14 ENCOUNTER — Emergency Department (HOSPITAL_COMMUNITY)
Admission: EM | Admit: 2014-11-14 | Discharge: 2014-11-14 | Disposition: A | Discharge status: Home / Self Care | Payer: BLUE CROSS/BLUE SHIELD | Attending: Emergency Medicine | Admitting: Emergency Medicine

## 2014-11-14 DIAGNOSIS — F32A Depression, unspecified: Secondary | ICD-10-CM

## 2014-11-14 DIAGNOSIS — G4700 Insomnia, unspecified: Secondary | ICD-10-CM | POA: Diagnosis present

## 2014-11-14 DIAGNOSIS — F1020 Alcohol dependence, uncomplicated: Secondary | ICD-10-CM | POA: Diagnosis present

## 2014-11-14 DIAGNOSIS — F411 Generalized anxiety disorder: Secondary | ICD-10-CM | POA: Diagnosis present

## 2014-11-14 DIAGNOSIS — R45851 Suicidal ideations: Secondary | ICD-10-CM | POA: Diagnosis not present

## 2014-11-14 DIAGNOSIS — Z79899 Other long term (current) drug therapy: Secondary | ICD-10-CM | POA: Insufficient documentation

## 2014-11-14 DIAGNOSIS — Z833 Family history of diabetes mellitus: Secondary | ICD-10-CM | POA: Diagnosis not present

## 2014-11-14 DIAGNOSIS — F322 Major depressive disorder, single episode, severe without psychotic features: Principal | ICD-10-CM | POA: Diagnosis present

## 2014-11-14 DIAGNOSIS — Z8719 Personal history of other diseases of the digestive system: Secondary | ICD-10-CM | POA: Diagnosis not present

## 2014-11-14 DIAGNOSIS — K219 Gastro-esophageal reflux disease without esophagitis: Secondary | ICD-10-CM | POA: Diagnosis present

## 2014-11-14 DIAGNOSIS — I10 Essential (primary) hypertension: Secondary | ICD-10-CM | POA: Diagnosis present

## 2014-11-14 DIAGNOSIS — Z008 Encounter for other general examination: Secondary | ICD-10-CM | POA: Diagnosis present

## 2014-11-14 DIAGNOSIS — F329 Major depressive disorder, single episode, unspecified: Secondary | ICD-10-CM

## 2014-11-14 DIAGNOSIS — Z8249 Family history of ischemic heart disease and other diseases of the circulatory system: Secondary | ICD-10-CM | POA: Diagnosis not present

## 2014-11-14 LAB — CBC WITH DIFFERENTIAL/PLATELET
BASOS ABS: 0 10*3/uL (ref 0.0–0.1)
Basophils Relative: 0 % (ref 0–1)
Eosinophils Absolute: 0 10*3/uL (ref 0.0–0.7)
Eosinophils Relative: 1 % (ref 0–5)
HCT: 46.1 % — ABNORMAL HIGH (ref 36.0–46.0)
Hemoglobin: 15.1 g/dL — ABNORMAL HIGH (ref 12.0–15.0)
LYMPHS ABS: 1.1 10*3/uL (ref 0.7–4.0)
Lymphocytes Relative: 19 % (ref 12–46)
MCH: 28.5 pg (ref 26.0–34.0)
MCHC: 32.8 g/dL (ref 30.0–36.0)
MCV: 87.1 fL (ref 78.0–100.0)
Monocytes Absolute: 0.6 10*3/uL (ref 0.1–1.0)
Monocytes Relative: 10 % (ref 3–12)
NEUTROS PCT: 70 % (ref 43–77)
Neutro Abs: 4.1 10*3/uL (ref 1.7–7.7)
Platelets: 216 10*3/uL (ref 150–400)
RBC: 5.29 MIL/uL — AB (ref 3.87–5.11)
RDW: 17.3 % — AB (ref 11.5–15.5)
WBC: 5.8 10*3/uL (ref 4.0–10.5)

## 2014-11-14 LAB — COMPREHENSIVE METABOLIC PANEL
ALK PHOS: 52 U/L (ref 39–117)
ALT: 12 U/L (ref 0–35)
AST: 23 U/L (ref 0–37)
Albumin: 4.1 g/dL (ref 3.5–5.2)
Anion gap: 13 (ref 5–15)
BUN: 12 mg/dL (ref 6–23)
CO2: 23 mmol/L (ref 19–32)
Calcium: 9.4 mg/dL (ref 8.4–10.5)
Chloride: 104 mmol/L (ref 96–112)
Creatinine, Ser: 0.55 mg/dL (ref 0.50–1.10)
GFR calc Af Amer: >90 mL/min (ref >90)
GFR calc non Af Amer: >90 mL/min (ref >90)
Glucose, Bld: 86 mg/dL (ref 70–99)
POTASSIUM: 3.6 mmol/L (ref 3.5–5.1)
Sodium: 140 mmol/L (ref 135–145)
Total Bilirubin: 0.7 mg/dL (ref 0.3–1.2)
Total Protein: 7.7 g/dL (ref 6.0–8.3)

## 2014-11-14 LAB — ACETAMINOPHEN LEVEL

## 2014-11-14 LAB — RAPID URINE DRUG SCREEN, HOSP PERFORMED
AMPHETAMINES: NOT DETECTED
Barbiturates: NOT DETECTED
Benzodiazepines: POSITIVE — AB
Cocaine: NOT DETECTED
Opiates: POSITIVE — AB
Tetrahydrocannabinol: NOT DETECTED

## 2014-11-14 LAB — SALICYLATE LEVEL

## 2014-11-14 LAB — ETHANOL: ALCOHOL ETHYL (B): 6 mg/dL (ref 0–9)

## 2014-11-14 MED ORDER — HYDROXYZINE HCL 50 MG PO TABS: 50.0000 mg | ORAL_TABLET | Freq: Three times a day (TID) | ORAL | Status: DC | PRN

## 2014-11-14 MED ORDER — ACETAMINOPHEN 325 MG PO TABS
650.0000 mg | ORAL_TABLET | Freq: Four times a day (QID) | ORAL | Status: DC | PRN
  Administered 2014-11-15 – 2014-11-17 (×2): 650 mg via ORAL
  Filled 2014-11-14 (×2): qty 2

## 2014-11-14 MED ORDER — TRAZODONE HCL 100 MG PO TABS
100.0000 mg | ORAL_TABLET | Freq: Every day | ORAL | Status: DC
  Administered 2014-11-14 – 2014-11-15 (×2): 100 mg via ORAL
  Filled 2014-11-14 (×4): qty 1

## 2014-11-14 MED ORDER — MAGNESIUM HYDROXIDE 400 MG/5ML PO SUSP: 30.0000 mL | Freq: Every day | ORAL | Status: DC | PRN

## 2014-11-14 MED ORDER — ALUM & MAG HYDROXIDE-SIMETH 200-200-20 MG/5ML PO SUSP: 30.0000 mL | ORAL | Status: DC | PRN

## 2014-11-14 MED ORDER — ESCITALOPRAM OXALATE 20 MG PO TABS
20.0000 mg | ORAL_TABLET | Freq: Every day | ORAL | Status: DC
  Administered 2014-11-15 – 2014-11-19 (×5): 20 mg via ORAL
  Filled 2014-11-14 (×5): qty 1
  Filled 2014-11-14: qty 2
  Filled 2014-11-14: qty 1
  Filled 2014-11-14: qty 14

## 2014-11-14 NOTE — ED Provider Notes (Signed)
CSN: [id number]     Arrival date & time 11/14/14  1616 History   First MD Initiated Contact with Patient 11/14/14 1635     Chief Complaint  Patient presents with  . V70.1     (Consider location/radiation/quality/duration/timing/severity/associated sxs/prior Treatment) HPI Comments: Patient here after ingesting 6 tablets of Ambien approximate 6 hours ago. This was a suicide attempt and she also admits to drinking alcohol. States that she has stresses at home related to her husband. Denies any command hallucinations. Denies any other ingestions. Does have a history of alcohol abuse. She also has a history of prior suicide attempt approximately 8 years ago. Her symptoms are persistent and nothing makes them better. Called EMS and was transported here  The history is provided by the patient.    Past Medical History  Diagnosis Date  . HTN (hypertension)   . GERD (gastroesophageal reflux disease)   . Depression   . Chest pain    Past Surgical History  Procedure Laterality Date  . Tubal ligation    . Foot surgery Right 9/10    right foot-bunion-hammertoe  . Tubes in ears    . Metatarsal osteotomy with bunionectomy Left 08/02/12    McBride  . Lapidus fusion Left 08/02/12   Family History  Problem Relation Age of Onset  . Hypertension Mother   . Cancer Mother     kidney  . Diabetes Father   . Hypertension Father    History  Substance Use Topics  . Smoking status: Never Smoker   . Smokeless tobacco: Never Used  . Alcohol Use: Yes     Comment: on ocassion   OB History    No data available     Review of Systems  All other systems reviewed and are negative.     Allergies  Amlodipine and Cortisporin  Home Medications   Prior to Admission medications   Medication Sig Start Date End Date Taking? Authorizing Provider  clonazePAM (KLONOPIN) 0.5 MG tablet Take 1 tablet (0.5 mg total) by mouth 2 (two) times daily as needed for anxiety. 06/11/14   Mary-Margaret Hassell Done, FNP   escitalopram (LEXAPRO) 20 MG tablet Take 1 tablet (20 mg total) by mouth daily. 06/11/14   Mary-Margaret Hassell Done, FNP   BP 127/84 mmHg  Pulse 93  Temp(Src) 97.9 F (36.6 C) (Oral)  Resp 16  Ht 5' (1.524 m)  Wt 145 lb (65.772 kg)  BMI 28.32 kg/m2  SpO2 100%  LMP 08/09/2013 Physical Exam  Constitutional: She is oriented to person, place, and time. She appears well-developed and well-nourished.  Non-toxic appearance. No distress.  HENT:  Head: Normocephalic and atraumatic.  Eyes: Conjunctivae, EOM and lids are normal. Pupils are equal, round, and reactive to light.  Neck: Normal range of motion. Neck supple. No tracheal deviation present. No thyroid mass present.  Cardiovascular: Normal rate, regular rhythm and normal heart sounds.  Exam reveals no gallop.   No murmur heard. Pulmonary/Chest: Effort normal and breath sounds normal. No stridor. No respiratory distress. She has no decreased breath sounds. She has no wheezes. She has no rhonchi. She has no rales.  Abdominal: Soft. Normal appearance and bowel sounds are normal. She exhibits no distension. There is no tenderness. There is no rebound and no CVA tenderness.  Musculoskeletal: Normal range of motion. She exhibits no edema or tenderness.  Neurological: She is alert and oriented to person, place, and time. She has normal strength. No cranial nerve deficit or sensory deficit. GCS eye subscore is 4.  GCS verbal subscore is 5. GCS motor subscore is 6.  Skin: Skin is warm and dry. No abrasion and no rash noted.  Psychiatric: Her speech is normal. Her affect is blunt. She is withdrawn. Thought content is not paranoid and not delusional. She expresses suicidal ideation. She expresses no homicidal ideation. She expresses suicidal plans. She expresses no homicidal plans.  Nursing note and vitals reviewed.   ED Course  Procedures (including critical care time) Labs Review Labs Reviewed  CBC WITH DIFFERENTIAL/PLATELET  COMPREHENSIVE  METABOLIC PANEL  URINE RAPID DRUG SCREEN (HOSP PERFORMED)  ETHANOL  SALICYLATE LEVEL  ACETAMINOPHEN LEVEL    Imaging Review No results found.   EKG Interpretation None      MDM   Final diagnoses:  None    Patient will be medically cleared and evaluated by TTS for inpatient placement  Lacretia Leigh, MD 11/14/14 1702
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
## 2014-11-14 NOTE — ED Notes (Signed)
Patient arrives via EMS from home with c/o SI. Per EMS, patient was speaking with EPA and stated SI with plan of OD on pills. Patient took 4 ambien and vodka shortly after. Arrives alert/oriented x 4.

## 2014-11-14 NOTE — ED Notes (Signed)
Pt is currently receiving TTS consult.
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
## 2014-11-14 NOTE — ED Notes (Signed)
Pt signed voluntary paperwork and purse and cellphone was given to husband, pt took clothing and medication with her to [facility].  Report given to [facility] by daytime nurse Randell Loop, RN.

## 2014-11-14 NOTE — ED Notes (Signed)
Pt's gave permission for her husband to take her purse and cell phone home.
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
## 2014-11-14 NOTE — BHH Counselor (Signed)
Pt accepted to 303-[address] per Debarah Crape [facility]. Accepting Dr. Sabra Heck. Report number #[phone]  Bedelia Person, M.S., LPCA, [place], [facility] Licensed Professional Counselor Associate  Triage Specialist  [facility]  Therapeutic Triage Services Phone: [phone] Fax: [phone]

## 2014-11-15 DIAGNOSIS — F322 Major depressive disorder, single episode, severe without psychotic features: Principal | ICD-10-CM

## 2014-11-15 LAB — TSH: TSH: 4.025 u[IU]/mL (ref 0.350–4.500)

## 2014-11-15 MED ORDER — HYDROXYZINE HCL 25 MG PO TABS
25.0000 mg | ORAL_TABLET | Freq: Three times a day (TID) | ORAL | Status: DC
  Administered 2014-11-15 – 2014-11-19 (×12): 25 mg via ORAL
  Filled 2014-11-15 (×5): qty 1
  Filled 2014-11-15 (×2): qty 42
  Filled 2014-11-15 (×7): qty 1
  Filled 2014-11-15: qty 42
  Filled 2014-11-15 (×4): qty 1

## 2014-11-15 MED ORDER — HYDROCODONE-ACETAMINOPHEN 10-325 MG PO TABS
1.0000 | ORAL_TABLET | Freq: Three times a day (TID) | ORAL | Status: DC | PRN
  Administered 2014-11-15 – 2014-11-19 (×12): 1 via ORAL
  Filled 2014-11-15 (×12): qty 1

## 2014-11-15 MED ORDER — LAMOTRIGINE 25 MG PO TABS
25.0000 mg | ORAL_TABLET | Freq: Every day | ORAL | Status: DC
  Administered 2014-11-15 – 2014-11-19 (×5): 25 mg via ORAL
  Filled 2014-11-15 (×2): qty 1
  Filled 2014-11-15: qty 14
  Filled 2014-11-15 (×4): qty 1

## 2014-11-15 MED ORDER — HYDROCODONE-ACETAMINOPHEN 10-325 MG PO TABS: 1.0000 | ORAL_TABLET | Freq: Four times a day (QID) | ORAL | Status: DC | PRN

## 2014-11-15 MED ORDER — PANTOPRAZOLE SODIUM 40 MG PO TBEC
40.0000 mg | DELAYED_RELEASE_TABLET | Freq: Every day | ORAL | Status: DC
  Administered 2014-11-15 – 2014-11-19 (×5): 40 mg via ORAL
  Filled 2014-11-15 (×6): qty 1
  Filled 2014-11-15: qty 14

## 2014-11-15 MED ORDER — CHLORDIAZEPOXIDE HCL 25 MG PO CAPS
25.0000 mg | ORAL_CAPSULE | Freq: Four times a day (QID) | ORAL | Status: DC | PRN
  Administered 2014-11-15 – 2014-11-18 (×7): 25 mg via ORAL
  Filled 2014-11-15 (×8): qty 1

## 2014-11-15 MED ORDER — ENSURE ENLIVE PO LIQD
237.0000 mL | Freq: Two times a day (BID) | ORAL | Status: DC
  Administered 2014-11-15 – 2014-11-19 (×8): 237 mL via ORAL
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
## 2014-11-15 NOTE — Progress Notes (Signed)
[facility] Group Notes:  (Nursing/MHT/Case Management/Adjunct)  Date:  11/15/2014  Time:  2100 Type of Therapy:  wrap up group  Participation Level:  Active  Participation Quality:  Appropriate, Attentive, Sharing and Supportive  Affect:  Appropriate  Cognitive:  Appropriate  Insight:  Good  Engagement in Group:  Engaged  Modes of Intervention:  Clarification, Education and Support  Summary of Progress/Problems:  Pt reported really enjoying her peer patients and shared that another pt gave her some good advice about her children. Pt reports that she had wanted to kill herself and suffers from alcoholism.  Pt reported not feeling hopeless anymore but still with depression and anxiety.   Jacques Navy 11/15/2014, 10:56 PM

## 2014-11-15 NOTE — [facility] Suicide Risk Assessment (Signed)
[facility] Admission Suicide Risk Assessment   Nursing information obtained from:    Demographic factors:    Current Mental Status:    Loss Factors:    Historical Factors:    Risk Reduction Factors:    Total Time spent with patient: 1 hour Principal Problem: Major depressive disorder, single episode, severe Diagnosis:   Patient Active Problem List   Diagnosis Date Noted  . Major depressive disorder, single episode, severe [F32.2] 11/14/2014  . Generalized anxiety disorder [F41.1] 11/14/2014  . Alcohol use disorder, severe, dependence [F10.20] 11/14/2014    Class: Chronic  . Other hammer toe (acquired) [M20.40] 05/06/2014  . Pronation deformity of ankle, acquired [M21.6X9] 05/06/2014  . Chronic cough [R05] 12/01/2013  . Essential hypertension, benign [I10] 11/13/2012  . Depression [F32.9] 11/13/2012  . GAD (generalized anxiety disorder) [F41.1] 11/13/2012  . GERD (gastroesophageal reflux disease) [K21.9] 11/13/2012  . Insomnia [G47.00] 11/13/2012  . Post-operative state [Z98.89] 10/30/2012     Continued Clinical Symptoms:  Alcohol Use Disorder Identification Test Final Score (AUDIT): 4 The "Alcohol Use Disorders Identification Test", Guidelines for Use in Primary Care, Second Edition.  World Pharmacologist [facility]). Score between 0-7:  no or low risk or alcohol related problems. Score between 8-15:  moderate risk of alcohol related problems. Score between 16-19:  high risk of alcohol related problems. Score 20 or above:  warrants further diagnostic evaluation for alcohol dependence and treatment.   CLINICAL FACTORS:   Severe Anxiety and/or Agitation Depression:   Anhedonia Comorbid alcohol abuse/dependence Hopelessness Impulsivity Insomnia Alcohol/Substance Abuse/Dependencies More than one psychiatric diagnosis Unstable or Poor Therapeutic Relationship   Musculoskeletal: Strength & Muscle Tone: within normal limits Gait & Station: normal Patient leans:  N/A  Psychiatric Specialty Exam: Physical Exam  Constitutional: She appears well-developed and well-nourished.    Review of Systems  Constitutional: Negative.   Skin: Negative for rash.  Psychiatric/Behavioral: Positive for depression, suicidal ideas and substance abuse. The patient is nervous/anxious.     Blood pressure 122/89, pulse 87, temperature 98.7 F (37.1 C), temperature source Oral, resp. rate 18, height 5' (1.524 m), weight 63.05 kg (139 lb), last menstrual period 08/09/2013.Body mass index is 27.15 kg/(m^2).  General Appearance: Casual  Eye Contact::  Fair  Speech:  Slow  Volume:  Decreased  Mood:  Dysphoric  Affect:  Constricted  Thought Process:  Coherent  Orientation:  Full (Time, Place, and Person)  Thought Content:  Rumination  Suicidal Thoughts:  Yes.  without intent/plan  Homicidal Thoughts:  No  Memory:  Immediate;   Fair Recent;   Fair  Judgement:  Poor  Insight:  Shallow  Psychomotor Activity:  Decreased  Concentration:  Fair  Recall:  [employer] of Knowledge:Fair  Language: Fair  Akathisia:  Negative  Handed:  Right  AIMS (if indicated):     Assets:  Desire for Improvement Vocational/Educational  Sleep:  Number of Hours: 4.25  Cognition: WNL  ADL's:  Intact     COGNITIVE FEATURES THAT CONTRIBUTE TO RISK:  Closed-mindedness and Polarized thinking    SUICIDE RISK:   Moderate:  Frequent suicidal ideation with limited intensity, and duration, some specificity in terms of plans, no associated intent, good self-control, limited dysphoria/symptomatology, some risk factors present, and identifiable protective factors, including available and accessible social support.  PLAN OF CARE: Stabilize with medications. Support groups. Monitor depression.  Medical Decision Making:  Review of Psycho-Social Stressors (1), Established Problem, Worsening (2), Review of Last Therapy Session (1) and Review of Medication Regimen &  Side Effects (2)  I certify that  inpatient services furnished can reasonably be expected to improve the patient's condition.   Brittan Butterbaugh 11/15/2014, 9:39 AM

## 2014-11-15 NOTE — Progress Notes (Signed)
Psychoeducational Group Note  Date: 11/15/2014 Time:  1015  Group Topic/Focus:  Identifying Needs:   The focus of this group is to help patients identify their personal needs that have been historically problematic and identify healthy behaviors to address their needs.  Participation Level:  Active  Participation Quality:  Appropriate  Affect:  Appropriate  Cognitive:  Oriented  Insight:  Improving  Engagement in Group:  Engaged  Additional Comments:  Pt attended this group and participated   Paulino Rily

## 2014-11-15 NOTE — Progress Notes (Signed)
Patient ID: [name], female   DOB: 1961/01/11, 54 y.o.   MRN: [id number]  Admission Note:  D:54 yr female who presents [facility] in no acute distress for the treatment of SI and Depression. Pt appears flat and depressed. Pt was calm and cooperative with admission process, with bouts of crying spells. Pt denies SI/HI/AVH at this time. Pt stated she fell 3 weeks ago and wears a brace on her wrist. Pt stated her husband of 39 yrs just quit his job without talking to her, and she feels the financial stress and burden of supporting the family. Pt was trying to get help for her spouse (ETOH-rehab), but husband was not cooperative.    A:Skin was assessed and found to be clear of any abnormal marks apart from a scars on abdomenal due to Laparoscopic sleeve, scars bilaterally feet due to bunion surgery. Marland Kitchen POC and unit policies explained and understanding verbalized. Consents obtained. Food and fluids offered, and  accepted .   R:Pt had no additional questions or concerns.

## 2014-11-15 NOTE — Tx Team (Signed)
Initial Interdisciplinary Treatment Plan   PATIENT STRESSORS: Legal issue Marital or family conflict Substance abuse   PATIENT STRENGTHS: Active sense of humor Average or above average intelligence General fund of knowledge Motivation for treatment/growth Supportive family/friends   PROBLEM LIST: Problem List/Patient Goals Date to be addressed Date deferred Reason deferred Estimated date of resolution  Risk for suicide 11/14/14     ETOH 11/14/14     Depression 11/14/14     "getting help for husband drinking" 11/14/14                                    DISCHARGE CRITERIA:  Ability to meet basic life and health needs Improved stabilization in mood, thinking, and/or behavior Medical problems require only outpatient monitoring Verbal commitment to aftercare and medication compliance  PRELIMINARY DISCHARGE PLAN: Attend aftercare/continuing care group Attend PHP/IOP Attend 12-step recovery group Outpatient therapy Participate in family therapy  PATIENT/FAMIILY INVOLVEMENT: This treatment plan has been presented to and reviewed with the patient, [name].  The patient and family have been given the opportunity to ask questions and make suggestions.  Vonzella Nipple A 11/15/2014, 2:46 AM

## 2014-11-15 NOTE — [facility] Group Notes (Signed)
[facility] Group Notes:  (Clinical Social Work)  11/15/2014   1:15-2:15PM  Summary of Progress/Problems:   The main focus of today's process group was for the patient to identify ways in which they have sabotaged their own mental health wellness/recovery.  Motivational interviewing and a handout were used to explore the benefits and costs of their self-sabotaging behavior as well as the benefits and costs of changing this behavior.  The Stages of Change were explained to the group using a handout, and patients identified where they are with regard to changing self-defeating behaviors.  The patient was late arriving to group, but participated fully once she did come.  She asked the group and CSW for what she can do if she is going home to be with husband who not only is an alcoholic and will drink in front of her regardless of her attempts to stop drinking, but also has had extreme health issues caused by his drinking that are threatening his life.  The group advised her that she may end up not being able to stay with him, and she acknowledged that this is likely to be the case.  Type of Therapy:  Process Group  Participation Level:  Active  Participation Quality:  Attentive and Sharing  Affect:  Blunted and Depressed  Cognitive:  Appropriate and Oriented  Insight:  Engaged  Engagement in Therapy:  Engaged  Modes of Intervention:  Education, Motivational Interviewing   Selmer Dominion, LCSW 11/15/2014, 4:00pm

## 2014-11-15 NOTE — Progress Notes (Signed)
Patient ID: [name], female   DOB: 02/26/1961, 54 y.o.   MRN: [id number] D  --  Pt. Has been pleasant and friendly to staff this shift.    She started the day complaining of chronic back pain and anxiety.  Medications for pain were ordered with good results.  She was able to relax and take  Part in groups and interacted well with peers.   She has been open and willing to confide in staff about issues at home that cause her stress.   She is a moderate fall risk , but has ambulated on the unit without assistance and has had NO falls.  Pts. States being worried about her husband at home  And how he is doing with his own ETOH issues.   She said  " I want to get things back in ordered in my life and to be happy again ".  --- A ---  Support , encouragement and medications provided.  --- R -- pt. Remains safe and friendly on unit
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
## 2014-11-15 NOTE — Progress Notes (Signed)
.  Psychoeducational Group Note    Date: 11/15/2014 Time:  0930    Goal Setting Purpose of Group: To be able to set a goal that is measurable and that can be accomplished in one day Participation Level:  Active  Participation Quality:  Appropriate  Affect:  Appropriate  Cognitive:  Oriented  Insight:  Improving  Engagement in Group:  Engaged  Additional Comments:  Pt shared with the group her goal for the day.  [name]

## 2014-11-16 MED ORDER — LORAZEPAM 0.5 MG PO TABS
0.5000 mg | ORAL_TABLET | Freq: Three times a day (TID) | ORAL | Status: DC | PRN
  Administered 2014-11-16: 0.5 mg via ORAL
  Filled 2014-11-16: qty 1

## 2014-11-16 MED ORDER — POLYETHYLENE GLYCOL 3350 17 G PO PACK
17.0000 g | PACK | Freq: Every day | ORAL | Status: DC
  Administered 2014-11-16 – 2014-11-19 (×4): 17 g via ORAL
  Filled 2014-11-16: qty 1
  Filled 2014-11-16: qty 14
  Filled 2014-11-16 (×4): qty 1

## 2014-11-16 MED ORDER — ADULT MULTIVITAMIN W/MINERALS CH
1.0000 | ORAL_TABLET | Freq: Every day | ORAL | Status: DC
  Administered 2014-11-16 – 2014-11-19 (×4): 1 via ORAL
  Filled 2014-11-16: qty 14
  Filled 2014-11-16 (×5): qty 1

## 2014-11-16 MED ORDER — DICLOFENAC SODIUM 50 MG PO TBEC
50.0000 mg | DELAYED_RELEASE_TABLET | Freq: Two times a day (BID) | ORAL | Status: DC
  Administered 2014-11-16 – 2014-11-19 (×7): 50 mg via ORAL
  Filled 2014-11-16: qty 28
  Filled 2014-11-16 (×8): qty 1
  Filled 2014-11-16: qty 28
  Filled 2014-11-16: qty 1

## 2014-11-16 MED ORDER — MIRTAZAPINE 7.5 MG PO TABS
7.5000 mg | ORAL_TABLET | Freq: Every day | ORAL | Status: DC
  Administered 2014-11-16 – 2014-11-18 (×3): 7.5 mg via ORAL
  Filled 2014-11-16: qty 14
  Filled 2014-11-16 (×4): qty 1

## 2014-11-16 NOTE — Progress Notes (Signed)
D   Pt is pleasant on approach and interacts well with others   She endorses anxiety and depression   She discussed her problems with constipation due to taking pain medications A   Encouraged pt to talke to doctor about her constipation problem and also talked to her about dietary changes to aide in regularity   Medications administered and effectiveness monitored   Q 15 min checks R   Pt safe at present

## 2014-11-16 NOTE — [facility] Group Notes (Signed)
[place] Group Notes:  (Clinical Social Work)  11/16/2014   1:15-2:15PM  Summary of Progress/Problems:  The main focus of today's process group was to   identify the patient's current support system and decide on other supports that can be put in place.  The picture on workbook was used to discuss why additional supports are needed.  An emphasis was placed on using counselor, doctor, therapy groups, 12-step groups, and problem-specific support groups to expand supports.   There was also an extensive discussion about what constitutes a healthy support versus an unhealthy support.  The patient expressed full comprehension of the concepts presented.  One current healthy support is her youngest son, as well as friends from work who really care about her, and a Librarian, academic who is aware of things going on.  Her unhealthy support is her husband, with whom she had a conversation after group yesterday about how he has to seek treatment and stop drinking alcohol or else she will make him move out of the home into a trailer that they have.  The house is hers, was paid for with her inheritance from her mother, and so his suggestion that she would be the one to move was quickly put down by her.  She stated that they have been married 33 years, and she does not want to start over, but she also cannot be around him drinking.  He has said he could go to rehab for 5 days, but would not be able to be without her for 30 days.  Type of Therapy:  Process Group  Participation Level:  Active  Participation Quality:  Attentive and Sharing  Affect:  Blunted  Cognitive:  Appropriate and Oriented  Insight:  Engaged  Engagement in Therapy:  Engaged  Modes of Intervention:  Education,  Support and [employer], LCSW 11/16/2014, 4:00pm

## 2014-11-16 NOTE — Progress Notes (Signed)
Patient did attend the evening speaker AA meeting.  

## 2014-11-16 NOTE — Progress Notes (Signed)
[facility] MD Progress Note  11/16/2014 9:39 AM [name]  MRN:  [id number]  Subjective: [name] says she is having a lot of anxiety today. She says she slept poorly due to nightmares. She is also endorsing back pains & has asked for her arthritis cream for her arthritic pains. She is currently visible on the unit. Participating actively in the group milieu. She denies any SIHI, AVH. Tolerating her medications well. Right arm brace intact.  Principal Problem: Major depressive disorder, single episode, severe  Diagnosis:   Patient Active Problem List   Diagnosis Date Noted  . Major depressive disorder, single episode, severe [F32.2] 11/14/2014  . Generalized anxiety disorder [F41.1] 11/14/2014  . Alcohol use disorder, severe, dependence [F10.20] 11/14/2014    Class: Chronic  . Other hammer toe (acquired) [M20.40] 05/06/2014  . Pronation deformity of ankle, acquired [M21.6X9] 05/06/2014  . Chronic cough [R05] 12/01/2013  . Essential hypertension, benign [I10] 11/13/2012  . Depression [F32.9] 11/13/2012  . GAD (generalized anxiety disorder) [F41.1] 11/13/2012  . GERD (gastroesophageal reflux disease) [K21.9] 11/13/2012  . Insomnia [G47.00] 11/13/2012  . Post-operative state [Z98.89] 10/30/2012   Total Time spent with patient: 35 minutes  Past Medical History:  Past Medical History  Diagnosis Date  . HTN (hypertension)   . GERD (gastroesophageal reflux disease)   . Depression   . Chest pain     Past Surgical History  Procedure Laterality Date  . Tubal ligation    . Foot surgery Right 9/10    right foot-bunion-hammertoe  . Tubes in ears    . Metatarsal osteotomy with bunionectomy Left 08/02/12    McBride  . Lapidus fusion Left 08/02/12   Family History:  Family History  Problem Relation Age of Onset  . Hypertension Mother   . Cancer Mother     kidney  . Diabetes Father   . Hypertension Father    Social History:  History  Alcohol Use  . Yes    Comment: on ocassion      History  Drug Use No    History   Social History  . Marital Status: Married    Spouse Name: N/A  . Number of Children: N/A  . Years of Education: N/A   Social History Main Topics  . Smoking status: Never Smoker   . Smokeless tobacco: Never Used  . Alcohol Use: Yes     Comment: on ocassion  . Drug Use: No  . Sexual Activity: No   Other Topics Concern  . None   Social History Narrative   Additional History:    Sleep: Poor  Appetite:  Fair  Assessment: Objective: Patient is seen and chart is reviewed. Patient continues to endorse ongoing anxiety, depressive symptoms and nightmares. She is rating her #8 and  depression at 5/10 today. However, she denies suicidal/homicidal thoughts and auditory/visual hallucinations. She is active in the group milieu. She is compliant with her current medication regimen and has not verbalized any adverse reactions. Her medications has been adjusted targeting her present symptoms.  Musculoskeletal: Strength & Muscle Tone: within normal limits Gait & Station: normal Patient leans: N/A  Psychiatric Specialty Exam: Physical Exam  Review of Systems  Constitutional: Negative.   HENT: Negative.   Eyes: Negative.   Respiratory: Negative.   Cardiovascular: Negative.   Gastrointestinal: Negative.   Genitourinary: Negative.   Musculoskeletal: Negative.   Skin: Negative.   Neurological: Negative.   Endo/Heme/Allergies: Negative.   Psychiatric/Behavioral: Positive for depression and substance abuse (hx of alcoholism,).  Negative for suicidal ideas, hallucinations and memory loss. The patient is nervous/anxious and has insomnia.     Blood pressure 98/60, pulse 113, temperature 98.7 F (37.1 C), temperature source Oral, resp. rate 16, height 5' (1.524 m), weight 63.05 kg (139 lb), last menstrual period 08/09/2013.Body mass index is 27.15 kg/(m^2).  General Appearance: Casual  Eye Contact::  Good  Speech:  Clear and Coherent  Volume:  Normal   Mood:  Anxious and Depressed  Affect:  Flat  Thought Process:  Coherent and Goal Directed  Orientation:  Full (Time, Place, and Person)  Thought Content:  Rumination  Suicidal Thoughts:  No  Homicidal Thoughts:  No  Memory:  Grossly intact  Judgement:  Fair  Insight:  Present  Psychomotor Activity:  High anxiety levels  Concentration:  Fair  Recall:  Good  Fund of Knowledge:Good  Language: Good  Akathisia:  No  Handed:  Right  AIMS (if indicated):     Assets:  Communication Skills Desire for Improvement  ADL's:  Intact  Cognition: WNL  Sleep:  Number of Hours: 4.5   Current Medications: Current Facility-Administered Medications  Medication Dose Route Frequency Provider Last Rate Last Dose  . acetaminophen (TYLENOL) tablet 650 mg  650 mg Oral Q6H PRN Harriet Butte, NP   650 mg at 11/15/14 0639  . alum & mag hydroxide-simeth (MAALOX/MYLANTA) 200-200-20 MG/5ML suspension 30 mL  30 mL Oral Q4H PRN Harriet Butte, NP      . chlordiazePOXIDE (LIBRIUM) capsule 25 mg  25 mg Oral QID PRN Encarnacion Slates, NP   25 mg at 11/16/14 0829  . diclofenac (VOLTAREN) EC tablet 50 mg  50 mg Oral BID Encarnacion Slates, NP      . escitalopram (LEXAPRO) tablet 20 mg  20 mg Oral Daily Harriet Butte, NP   20 mg at 11/16/14 0830  . feeding supplement (ENSURE ENLIVE) (ENSURE ENLIVE) liquid 237 mL  237 mL Oral BID BM Nicholaus Bloom, MD   237 mL at 11/16/14 0831  . HYDROcodone-acetaminophen (NORCO) 10-325 MG per tablet 1 tablet  1 tablet Oral TID PRN Encarnacion Slates, NP   1 tablet at 11/16/14 1607  . hydrOXYzine (ATARAX/VISTARIL) tablet 25 mg  25 mg Oral TID Encarnacion Slates, NP   25 mg at 11/16/14 0830  . lamoTRIgine (LAMICTAL) tablet 25 mg  25 mg Oral Daily Encarnacion Slates, NP   25 mg at 11/16/14 0830  . LORazepam (ATIVAN) tablet 0.5 mg  0.5 mg Oral Q8H PRN Encarnacion Slates, NP      . magnesium hydroxide (MILK OF MAGNESIA) suspension 30 mL  30 mL Oral Daily PRN Harriet Butte, NP      . mirtazapine (REMERON) tablet  7.5 mg  7.5 mg Oral QHS Encarnacion Slates, NP      . multivitamin with minerals tablet 1 tablet  1 tablet Oral Daily Encarnacion Slates, NP      . pantoprazole (PROTONIX) EC tablet 40 mg  40 mg Oral Daily Encarnacion Slates, NP   40 mg at 11/16/14 0830  . polyethylene glycol (MIRALAX / GLYCOLAX) packet 17 g  17 g Oral Daily Encarnacion Slates, NP       Lab Results:  Results for orders placed or performed during the hospital encounter of 11/14/14 (from the past 48 hour(s))  TSH     Status: None   Collection Time: 11/15/14  6:30 AM  Result Value Ref Range  TSH 4.025 0.350 - 4.500 uIU/mL    Comment: Performed at [facility]   Physical Findings: AIMS: Facial and Oral Movements Muscles of Facial Expression: None, normal Lips and Perioral Area: None, normal Jaw: None, normal Tongue: None, normal,Extremity Movements Upper (arms, wrists, hands, fingers): None, normal Lower (legs, knees, ankles, toes): None, normal, Trunk Movements Neck, shoulders, hips: None, normal, Overall Severity Severity of abnormal movements (highest score from questions above): None, normal Incapacitation due to abnormal movements: None, normal Patient's awareness of abnormal movements (rate only patient's report): No Awareness,    CIWA:  CIWA-Ar Total: 0 COWS:     Treatment Plan Summary: Daily contact with patient to assess and evaluate symptoms and progress in treatment and Medication management:  1. Continue crisis management, mood stabilization & relapse prevention.. 2. Continue current medication management to reduce current symptoms to base line and improve the  patient's overall level of functioning; resume Lexapro 20 mg for depression, Ativan 0.5 mg Q 8 hours prn for anxiety, discontinue Trazodone due to nightmares, start Remeron 15 mg for insomnia, continue Lamictal 25 mg . 3. Treat health problems as indicated; resumed all pertinent home medications, initiate Miralax 17 gm for constipation, Voltaren cream for  arthritis pain, Protonix 40 mg for GERD 4. Develop treatment plan to enhance medication adeherance upon discharge and the need for  readmission. 5. Psycho-social education regarding relapse prevention and self care.  Medical Decision Making:  Established Problem, Stable/Improving (1), Review of Psycho-Social Stressors (1), Review of Medication Regimen & Side Effects (2) and Review of New Medication or Change in Dosage (2)  Encarnacion Slates, PMHNP, FNP-BC 11/16/2014, 9:39 AM I agreed with findings and treatment plan of this patient

## 2014-11-16 NOTE — [facility] Group Notes (Signed)
[place] Group Notes:  (Nursing/MHT/Case Management/Adjunct)  Date:  11/16/2014  Time:  10:38 AM  Type of Therapy:  Psychoeducational Skills  Participation Level:  Active  Participation Quality:  Appropriate  Affect:  Appropriate  Cognitive:  Appropriate  Insight:  Appropriate  Engagement in Group:  Engaged  Modes of Intervention:  Discussion  Summary of Progress/Problems: Pt did attend self inventory group, pt reported that she was negative SI/HI, no AH/VH noted. Pt rated her depression as a 8, her anxiety as a 8, and her helplessness/hopelessness as a 8.     Pt reported concerns about bad dreams at night due to sleep medication and increased anxiety, pt advised that the doctor will be made aware.   Benancio Deeds Shanta 11/16/2014, 10:38 AM

## 2014-11-16 NOTE — Progress Notes (Signed)
NUTRITION ASSESSMENT  Pt identified as at risk on the Malnutrition Screen Tool  INTERVENTION: 1. Educated patient on the importance of nutrition and encouraged intake of food and beverages. 2. Discussed weight goals. 3. Supplements: Ensure Enlive po BID, each supplement provides 350 kcal and 20 grams of protein  NUTRITION DIAGNOSIS: Increased nutrient needs related to ETOH abuse as evidenced by estimated nutritional needs.  Goal: Pt to meet >/= 90% of their estimated nutrition needs.  Monitor:  PO intake  Assessment:  Pt admitted with depression, anxiety and alcohol abuse. PMHx of gastric sleeve surgery at [facility] in August 2015.  Pt with history of bariatric surgery, states that she has lost 55 lb since her surgery. States he has been eating like she should and is drinking her Ensure supplements. Encouraged small frequent meals with adequate protein consumption.  Height: Ht Readings from Last 1 Encounters:  11/14/14 5' (1.524 m)    Weight: Wt Readings from Last 1 Encounters:  11/14/14 139 lb (63.05 kg)    Weight Hx: Wt Readings from Last 10 Encounters:  11/14/14 139 lb (63.05 kg)  11/14/14 145 lb (65.772 kg)  10/21/14 143 lb 12.8 oz (65.227 kg)  06/11/14 163 lb 9.6 oz (74.208 kg)  12/23/13 186 lb 3.2 oz (84.46 kg)  12/19/13 189 lb 6.4 oz (85.911 kg)  12/09/13 189 lb (85.73 kg)  12/06/13 188 lb (85.276 kg)  11/25/13 191 lb 12.8 oz (87 kg)  11/06/13 188 lb 3.2 oz (85.367 kg)    BMI:  Body mass index is 27.15 kg/(m^2). Pt meets criteria for overweight based on current BMI.  Estimated Nutritional Needs: Kcal: 25-30 kcal/kg Protein: > 1 gram protein/kg Fluid: 1 ml/kcal  Diet Order: Diet regular Room service appropriate?: Yes; Fluid consistency:: Thin Pt is also offered choice of unit snacks mid-morning and mid-afternoon.  Pt is eating as desired.   Lab results and medications reviewed.   Clayton Bibles, MS, RD, LDN Pager: [phone] After Hours  Pager: [phone]

## 2014-11-16 NOTE — Progress Notes (Signed)
Patient ID: [name], female   DOB: 01/24/1961, 54 y.o.   MRN: [id number]   D: Pt has been very flat and depressed on the unit today, she reported that she was having lots of anxiety and that she was in a lot of pain. Pt reported that she was on medication for both at home and that she was not getting her mediation, Aggie NP made aware new orders noted. Pt reported on her self inventory sheet that her depression was a 8, her anxiety was a 8, and that her hopelessness was a 8. Pts goal for today was to get better, think positive, and don't be influenced by others. Pt reported being negative SI/HI, no AH/VH noted. A: 15 min checks continued for patient safety. R: Pt safety maintained.

## 2014-11-17 NOTE — [facility] Group Notes (Signed)
[place] LCSW Group Therapy 11/17/2014  1:15 pm  Type of Therapy: Group Therapy Participation Level: Active  Participation Quality: Attentive, Sharing and Supportive  Affect: Appropriate  Cognitive: Alert and Oriented  Insight: Developing/Improving and Engaged  Engagement in Therapy: Developing/Improving and Engaged  Modes of Intervention: Clarification, Confrontation, Discussion, Education, Exploration,  Limit-setting, Orientation, Problem-solving, Rapport Building, Art therapist, Socialization and Support  Summary of Progress/Problems: Pt identified obstacles faced currently and processed barriers involved in overcoming these obstacles. Pt identified steps necessary for overcoming these obstacles and explored motivation (internal and external) for facing these difficulties head on. Pt further identified one area of concern in their lives and chose a goal to focus on for today. Patient actively listened during group discussion but did not share despite CSW encouragement. Patient inquired about her discharge plans, CSW agreed to meet with patient individually after group.  Tilden Fossa, MSW, [place] Worker [facility] [phone]

## 2014-11-17 NOTE — Progress Notes (Signed)
[facility] MD Progress Note  11/17/2014 4:31 PM [name]  MRN:  [id number] Subjective:  [name] states that she got to drinking responding to her husband's drinking. This has been going on since October. Her husband's drinking has alienated her son's and the oldest would not let them see the grandkids. They seem to blame her as she enables him. She is the one buying the alcohol. He quit his job, so she is the only one working. This has put more pressure on her. He had severe DT's and went to [facility] but did not stay and continue to drink. She admits she is to a point she will be willing to leave him. She is not planning to go to the house when she leaves. She states she would be willing to go to rehab as long as she does not lose her job Principal Problem: Major depressive disorder, single episode, severe Diagnosis:   Patient Active Problem List   Diagnosis Date Noted  . Major depressive disorder, single episode, severe [F32.2] 11/14/2014  . Generalized anxiety disorder [F41.1] 11/14/2014  . Alcohol use disorder, severe, dependence [F10.20] 11/14/2014    Class: Chronic  . Other hammer toe (acquired) [M20.40] 05/06/2014  . Pronation deformity of ankle, acquired [M21.6X9] 05/06/2014  . Chronic cough [R05] 12/01/2013  . Essential hypertension, benign [I10] 11/13/2012  . Depression [F32.9] 11/13/2012  . GAD (generalized anxiety disorder) [F41.1] 11/13/2012  . GERD (gastroesophageal reflux disease) [K21.9] 11/13/2012  . Insomnia [G47.00] 11/13/2012  . Post-operative state [Z98.89] 10/30/2012   Total Time spent with patient: 30 minutes   Past Medical History:  Past Medical History  Diagnosis Date  . HTN (hypertension)   . GERD (gastroesophageal reflux disease)   . Depression   . Chest pain     Past Surgical History  Procedure Laterality Date  . Tubal ligation    . Foot surgery Right 9/10    right foot-bunion-hammertoe  . Tubes in ears    . Metatarsal osteotomy with bunionectomy  Left 08/02/12    McBride  . Lapidus fusion Left 08/02/12   Family History:  Family History  Problem Relation Age of Onset  . Hypertension Mother   . Cancer Mother     kidney  . Diabetes Father   . Hypertension Father    Social History:  History  Alcohol Use  . Yes    Comment: on ocassion     History  Drug Use No    History   Social History  . Marital Status: Married    Spouse Name: N/A  . Number of Children: N/A  . Years of Education: N/A   Social History Main Topics  . Smoking status: Never Smoker   . Smokeless tobacco: Never Used  . Alcohol Use: Yes     Comment: on ocassion  . Drug Use: No  . Sexual Activity: No   Other Topics Concern  . None   Social History Narrative   Additional History:    Sleep: Fair  Appetite:  Poor   Assessment:   Musculoskeletal: Strength & Muscle Tone: within normal limits Gait & Station: normal Patient leans: N/A   Psychiatric Specialty Exam: Physical Exam  Review of Systems  Constitutional: Negative.   HENT: Negative.   Eyes: Negative.   Respiratory: Negative.   Cardiovascular: Negative.   Gastrointestinal: Negative.   Genitourinary: Negative.   Musculoskeletal: Negative.   Skin: Negative.   Neurological: Negative.   Endo/Heme/Allergies: Negative.   Psychiatric/Behavioral: Positive for depression and  substance abuse. The patient is nervous/anxious and has insomnia.     Blood pressure 107/68, pulse 105, temperature 98.2 F (36.8 C), temperature source Oral, resp. rate 20, height 5' (1.524 m), weight 63.05 kg (139 lb), last menstrual period 08/09/2013.Body mass index is 27.15 kg/(m^2).  General Appearance: Fairly Groomed  Engineer, water::  Fair  Speech:  Clear and Coherent  Volume:  Decreased  Mood:  Anxious and Depressed  Affect:  anxious depressed worried tearful  Thought Process:  Coherent and Goal Directed  Orientation:  Full (Time, Place, and Person)  Thought Content:  symptoms events worries concerns   Suicidal Thoughts:  No  Homicidal Thoughts:  No  Memory:  Immediate;   Fair Recent;   Fair Remote;   Fair  Judgement:  Fair  Insight:  Present  Psychomotor Activity:  Restlessness  Concentration:  Fair  Recall:  [employer] of Knowledge:Fair  Language: Fair  Akathisia:  No  Handed:  Right  AIMS (if indicated):     Assets:  Desire for Improvement Vocational/Educational  ADL's:  Intact  Cognition: WNL  Sleep:  Number of Hours: 5.5     Current Medications: Current Facility-Administered Medications  Medication Dose Route Frequency Provider Last Rate Last Dose  . acetaminophen (TYLENOL) tablet 650 mg  650 mg Oral Q6H PRN Harriet Butte, NP   650 mg at 11/17/14 1446  . alum & mag hydroxide-simeth (MAALOX/MYLANTA) 200-200-20 MG/5ML suspension 30 mL  30 mL Oral Q4H PRN Harriet Butte, NP      . chlordiazePOXIDE (LIBRIUM) capsule 25 mg  25 mg Oral QID PRN Encarnacion Slates, NP   25 mg at 11/17/14 0828  . diclofenac (VOLTAREN) EC tablet 50 mg  50 mg Oral BID Encarnacion Slates, NP   50 mg at 11/17/14 1538  . escitalopram (LEXAPRO) tablet 20 mg  20 mg Oral Daily Harriet Butte, NP   20 mg at 11/17/14 0827  . feeding supplement (ENSURE ENLIVE) (ENSURE ENLIVE) liquid 237 mL  237 mL Oral BID BM Clayton Bibles, RD   237 mL at 11/17/14 1443  . HYDROcodone-acetaminophen (NORCO) 10-325 MG per tablet 1 tablet  1 tablet Oral TID PRN Encarnacion Slates, NP   1 tablet at 11/17/14 1543  . hydrOXYzine (ATARAX/VISTARIL) tablet 25 mg  25 mg Oral TID Encarnacion Slates, NP   25 mg at 11/17/14 1442  . lamoTRIgine (LAMICTAL) tablet 25 mg  25 mg Oral Daily Encarnacion Slates, NP   25 mg at 11/17/14 0826  . LORazepam (ATIVAN) tablet 0.5 mg  0.5 mg Oral Q8H PRN Encarnacion Slates, NP   0.5 mg at 11/16/14 1435  . magnesium hydroxide (MILK OF MAGNESIA) suspension 30 mL  30 mL Oral Daily PRN Harriet Butte, NP      . mirtazapine (REMERON) tablet 7.5 mg  7.5 mg Oral QHS Encarnacion Slates, NP   7.5 mg at 11/16/14 2109  . multivitamin with  minerals tablet 1 tablet  1 tablet Oral Daily Encarnacion Slates, NP   1 tablet at 11/17/14 0826  . pantoprazole (PROTONIX) EC tablet 40 mg  40 mg Oral Daily Encarnacion Slates, NP   40 mg at 11/17/14 0827  . polyethylene glycol (MIRALAX / GLYCOLAX) packet 17 g  17 g Oral Daily Encarnacion Slates, NP   17 g at 11/17/14 3300    Lab Results: No results found for this or any previous visit (from the past 48 hour(s)).  Physical Findings: AIMS: Facial and Oral Movements Muscles of Facial Expression: None, normal Lips and Perioral Area: None, normal Jaw: None, normal Tongue: None, normal,Extremity Movements Upper (arms, wrists, hands, fingers): None, normal Lower (legs, knees, ankles, toes): None, normal, Trunk Movements Neck, shoulders, hips: None, normal, Overall Severity Severity of abnormal movements (highest score from questions above): None, normal Incapacitation due to abnormal movements: None, normal Patient's awareness of abnormal movements (rate only patient's report): No Awareness,    CIWA:  CIWA-Ar Total: 0 COWS:     Treatment Plan Summary: Daily contact with patient to assess and evaluate symptoms and progress in treatment and Medication management Supportive approach/coping skills Alcohol Dependence: continue Librium detox and work a relapse prevention plan Explore residential treatment options, if unable to go to rehab will put together a CD IOP Will explore the use of Antabuse and Campral Will continue to reassess and address the co morbidities Her Lexapro was increased to 20 mg but she had been drinking so it would be difficult to assess the benefit She was also placed on Lamictal as a mood stabilizer will pursue this medication further Medical Decision Making:  Review of Psycho-Social Stressors (1), Review or order clinical lab tests (1), Review of Medication Regimen & Side Effects (2) and Review of New Medication or Change in Dosage (2)     Akari [employer]ler A 11/17/2014, 4:31 PM

## 2014-11-17 NOTE — Progress Notes (Signed)
D: Pt is negative for any SI/HI/AVH. Pt presents appropriate in affect and pleasant in mood. Pt endorses anxiety but at a decreased level from earlier in the day. Pt is excited that her husband is joining her for the journey of sobriety. Pt is hoping to be discharged soon. Pt remains open to outpatient services.  A: Writer administered scheduled and prn medications to pt, per MD orders. Continued support and availability as needed was extended to this pt. Staff continue to monitor pt with q17min checks.  R: No adverse drug reactions noted. Pt receptive to treatment. Pt remains safe at this time.

## 2014-11-17 NOTE — Progress Notes (Signed)
D: Pt denies SI/HI, denies hallucinations. Pt has been visible in milieu interacting with peers and staff appropriately.   A: Introduced self to pt. Supported and encouraged pt. Medications administered per order. PRN medication administered for chronic back pain.   R: Pt is compliant with medications. Pt verbally contracts for safety. Will continue to monitor and assess.

## 2014-11-17 NOTE — BHH Group Notes (Signed)
   [facility] LCSW Aftercare Discharge Planning Group Note  11/17/2014  8:45 AM   Participation Quality: Alert, Appropriate and Oriented  Mood/Affect: Depressed and Flat  Depression Rating: 5  Anxiety Rating: 5  Thoughts of Suicide: Pt denies SI/HI  Will you contract for safety? Yes  Current AVH: Pt denies  Plan for Discharge/Comments: Pt attended discharge planning group and actively participated in group. CSW provided pt with today's workbook. Patient tearful during discussion. Reports that she is unsure if she can return home with her husband who is an alcoholic at discharge. Patient is worried about losing her job and is unsure if she would like residential treatment vs. Outpatient.  Transportation Means: Pt reports access to transportation  Supports: No supports mentioned at this time  Tilden Fossa, MSW, [place] Social Worker [employer] [phone]

## 2014-11-17 NOTE — BHH Suicide Risk Assessment (Signed)
[facility] INPATIENT:  Family/Significant Other Suicide Prevention Education  Suicide Prevention Education:  Education Completed; Husband Marieme Mcmackin [phone],  (name of family member/significant other) has been identified by the patient as the family member/significant other with whom the patient will be residing, and identified as the person(s) who will aid the patient in the event of a mental health crisis (suicidal ideations/suicide attempt).  With written consent from the patient, the family member/significant other has been provided the following suicide prevention education, prior to the and/or following the discharge of the patient.  The suicide prevention education provided includes the following:  Suicide risk factors  Suicide prevention and interventions  National Suicide Hotline telephone number  [facility] assessment telephone number  [facility] Emergency Assistance [place] and/or Residential Mobile Crisis Unit telephone number  Request made of family/significant other to:  Remove weapons (e.g., guns, rifles, knives), all items previously/currently identified as safety concern.    Remove drugs/medications (over-the-counter, prescriptions, illicit drugs), all items previously/currently identified as a safety concern.  The family member/significant other verbalizes understanding of the suicide prevention education information provided.  The family member/significant other agrees to remove the items of safety concern listed above.  Trystian Crisanto, Casimiro Needle 11/17/2014, 4:16 PM

## 2014-11-18 MED ORDER — ACAMPROSATE CALCIUM 333 MG PO TBEC
666.0000 mg | DELAYED_RELEASE_TABLET | Freq: Three times a day (TID) | ORAL | Status: DC
  Administered 2014-11-18 – 2014-11-19 (×4): 666 mg via ORAL
  Filled 2014-11-18 (×2): qty 84
  Filled 2014-11-18 (×3): qty 2
  Filled 2014-11-18: qty 84
  Filled 2014-11-18 (×7): qty 2

## 2014-11-18 MED ORDER — DISULFIRAM 250 MG PO TABS
250.0000 mg | ORAL_TABLET | Freq: Every day | ORAL | Status: DC
  Administered 2014-11-18 – 2014-11-19 (×2): 250 mg via ORAL
  Filled 2014-11-18: qty 14
  Filled 2014-11-18 (×5): qty 1

## 2014-11-18 NOTE — BHH Group Notes (Signed)
[facility] LCSW Group Therapy  11/18/2014   1:15 PM   Type of Therapy:  Group Therapy  Participation Level:  Active  Participation Quality:  Attentive, Sharing and Supportive  Affect: Appropriate  Cognitive:  Alert and Oriented  Insight:  Developing/Improving and Engaged  Engagement in Therapy:  Developing/Improving and Engaged  Modes of Intervention:  Clarification, Confrontation, Discussion, Education, Exploration, Limit-setting, Orientation, Problem-solving, Rapport Building, Art therapist, Socialization and Support  Summary of Progress/Problems: The topic for group therapy was feelings about diagnosis.  Pt actively participated in group discussion on their past and current diagnosis and how they feel towards this.  Pt also identified how society and family members judge them, based on their diagnosis as well as stereotypes and stigmas.  Patient reports that her mental health/substance abuse issues make her feel "like a failure" due to enabling her husband and losing self-control to alcohol. Patient reports that she wants to start focusing on herself more. CSW and other group members provided patient with emotional support and encouragement.  Tilden Fossa, MSW, [place] Worker [facility] [phone]

## 2014-11-18 NOTE — Progress Notes (Signed)
[facility] MD Progress Note  11/18/2014 5:31 PM [name]  MRN:  [id number] Subjective:  [name] is still trying to decide what to do. She spoke with her husband and seems he had communicated with his younger son and told him he did not want to be like he was. She is hopeful that he is going to do something about his drinking and change his ways. She is still very worried anxious about her job. She admits she is trying to deal with her husband, work and at the same time trying to mend the relationship with her kids. Admits she has gotten overwhelmed and was using the alcohol to cope Principal Problem: Major depressive disorder, single episode, severe Diagnosis:   Patient Active Problem List   Diagnosis Date Noted  . Major depressive disorder, single episode, severe [F32.2] 11/14/2014  . Generalized anxiety disorder [F41.1] 11/14/2014  . Alcohol use disorder, severe, dependence [F10.20] 11/14/2014    Class: Chronic  . Other hammer toe (acquired) [M20.40] 05/06/2014  . Pronation deformity of ankle, acquired [M21.6X9] 05/06/2014  . Chronic cough [R05] 12/01/2013  . Essential hypertension, benign [I10] 11/13/2012  . Depression [F32.9] 11/13/2012  . GAD (generalized anxiety disorder) [F41.1] 11/13/2012  . GERD (gastroesophageal reflux disease) [K21.9] 11/13/2012  . Insomnia [G47.00] 11/13/2012  . Post-operative state [Z98.89] 10/30/2012   Total Time spent with patient: 30 minutes   Past Medical History:  Past Medical History  Diagnosis Date  . HTN (hypertension)   . GERD (gastroesophageal reflux disease)   . Depression   . Chest pain     Past Surgical History  Procedure Laterality Date  . Tubal ligation    . Foot surgery Right 9/10    right foot-bunion-hammertoe  . Tubes in ears    . Metatarsal osteotomy with bunionectomy Left 08/02/12    [facility]  . Lapidus fusion Left 08/02/12   Family History:  Family History  Problem Relation Age of Onset  . Hypertension Mother   . Cancer  Mother     kidney  . Diabetes Father   . Hypertension Father    Social History:  History  Alcohol Use  . Yes    Comment: on ocassion     History  Drug Use No    History   Social History  . Marital Status: Married    Spouse Name: N/A  . Number of Children: N/A  . Years of Education: N/A   Social History Main Topics  . Smoking status: Never Smoker   . Smokeless tobacco: Never Used  . Alcohol Use: Yes     Comment: on ocassion  . Drug Use: No  . Sexual Activity: No   Other Topics Concern  . None   Social History Narrative   Additional History:    Sleep: Fair  Appetite:  Fair   Assessment:   Musculoskeletal: Strength & Muscle Tone: within normal limits Gait & Station: normal Patient leans: N/A   Psychiatric Specialty Exam: Physical Exam  Review of Systems  Constitutional: Negative.   HENT: Negative.   Eyes: Negative.   Respiratory: Negative.   Cardiovascular: Negative.   Gastrointestinal: Negative.   Genitourinary: Negative.   Musculoskeletal: Negative.   Skin: Negative.   Neurological: Negative.   Endo/Heme/Allergies: Negative.   Psychiatric/Behavioral: Positive for depression and substance abuse. The patient is nervous/anxious.     Blood pressure 107/81, pulse 79, temperature 98.1 F (36.7 C), temperature source Oral, resp. rate 16, height 5' (1.524 m), weight 63.05 kg (139 lb),  last menstrual period 08/09/2013.Body mass index is 27.15 kg/(m^2).  General Appearance: Fairly Groomed  Engineer, water::  Fair  Speech:  Clear and Coherent  Volume:  Decreased  Mood:  Anxious and Depressed  Affect:  Tearful and anxious worried depressed  Thought Process:  Coherent and Goal Directed  Orientation:  Full (Time, Place, and Person)  Thought Content:  symptoms events worries concerns  Suicidal Thoughts:  No  Homicidal Thoughts:  No  Memory:  Immediate;   Fair Recent;   Fair Remote;   Fair  Judgement:  Fair  Insight:  Present and Shallow  Psychomotor  Activity:  Restlessness  Concentration:  Fair  Recall:  [employer] of Knowledge:Fair  Language: Fair  Akathisia:  No  Handed:  Right  AIMS (if indicated):     Assets:  Desire for Improvement Housing Vocational/Educational  ADL's:  Intact  Cognition: WNL  Sleep:  Number of Hours: 5     Current Medications: Current Facility-Administered Medications  Medication Dose Route Frequency Provider Last Rate Last Dose  . acamprosate (CAMPRAL) tablet 666 mg  666 mg Oral TID Nicholaus Bloom, MD   666 mg at 11/18/14 1624  . acetaminophen (TYLENOL) tablet 650 mg  650 mg Oral Q6H PRN Harriet Butte, NP   650 mg at 11/17/14 1446  . alum & mag hydroxide-simeth (MAALOX/MYLANTA) 200-200-20 MG/5ML suspension 30 mL  30 mL Oral Q4H PRN Harriet Butte, NP      . chlordiazePOXIDE (LIBRIUM) capsule 25 mg  25 mg Oral QID PRN Encarnacion Slates, NP   25 mg at 11/18/14 1624  . diclofenac (VOLTAREN) EC tablet 50 mg  50 mg Oral BID Encarnacion Slates, NP   50 mg at 11/18/14 1624  . disulfiram (ANTABUSE) tablet 250 mg  250 mg Oral Daily Nicholaus Bloom, MD   250 mg at 11/18/14 1255  . escitalopram (LEXAPRO) tablet 20 mg  20 mg Oral Daily Harriet Butte, NP   20 mg at 11/18/14 0802  . feeding supplement (ENSURE ENLIVE) (ENSURE ENLIVE) liquid 237 mL  237 mL Oral BID BM Clayton Bibles, RD   237 mL at 11/18/14 1437  . HYDROcodone-acetaminophen (NORCO) 10-325 MG per tablet 1 tablet  1 tablet Oral TID PRN Encarnacion Slates, NP   1 tablet at 11/18/14 1437  . hydrOXYzine (ATARAX/VISTARIL) tablet 25 mg  25 mg Oral TID Encarnacion Slates, NP   25 mg at 11/18/14 1437  . lamoTRIgine (LAMICTAL) tablet 25 mg  25 mg Oral Daily Encarnacion Slates, NP   25 mg at 11/18/14 0802  . LORazepam (ATIVAN) tablet 0.5 mg  0.5 mg Oral Q8H PRN Encarnacion Slates, NP   0.5 mg at 11/16/14 1435  . magnesium hydroxide (MILK OF MAGNESIA) suspension 30 mL  30 mL Oral Daily PRN Harriet Butte, NP      . mirtazapine (REMERON) tablet 7.5 mg  7.5 mg Oral QHS Encarnacion Slates, NP   7.5  mg at 11/17/14 2215  . multivitamin with minerals tablet 1 tablet  1 tablet Oral Daily Encarnacion Slates, NP   1 tablet at 11/18/14 0802  . pantoprazole (PROTONIX) EC tablet 40 mg  40 mg Oral Daily Encarnacion Slates, NP   40 mg at 11/18/14 0802  . polyethylene glycol (MIRALAX / GLYCOLAX) packet 17 g  17 g Oral Daily Encarnacion Slates, NP   17 g at 11/18/14 0800    Lab Results: No results found  for this or any previous visit (from the past 48 hour(s)).  Physical Findings: AIMS: Facial and Oral Movements Muscles of Facial Expression: None, normal Lips and Perioral Area: None, normal Jaw: None, normal Tongue: None, normal,Extremity Movements Upper (arms, wrists, hands, fingers): None, normal Lower (legs, knees, ankles, toes): None, normal, Trunk Movements Neck, shoulders, hips: None, normal, Overall Severity Severity of abnormal movements (highest score from questions above): None, normal Incapacitation due to abnormal movements: None, normal Patient's awareness of abnormal movements (rate only patient's report): No Awareness,    CIWA:  CIWA-Ar Total: 0 COWS:     Treatment Plan Summary: Daily contact with patient to assess and evaluate symptoms and progress in treatment and Medication management Supportive approach/coping skills Alcohol Dependence: continue detox/work a relapse prevention plan. Will start Antabuse and Campral for the cravings Major Depression: Continue the Lexapro and optimize response  Work with CBT/mindfulness  Medical Decision Making:  Review of Psycho-Social Stressors (1), Review of Medication Regimen & Side Effects (2) and Review of New Medication or Change in Dosage (2)     Yarelli Decelles A 11/18/2014, 5:31 PM

## 2014-11-18 NOTE — Progress Notes (Signed)
Recreation Therapy Notes  Animal-Assisted Activity (AAA) Program Checklist/Progress Notes Patient Eligibility Criteria Checklist & Daily Group note for Rec Tx Intervention  Date: 04.12.2016 Time: 2:45pm Location: [address]    AAA/T Program Assumption of Risk Form signed by Patient/ or Parent Legal Guardian yes  Patient is free of allergies or sever asthma yes  Patient reports no fear of animals yes  Patient reports no history of cruelty to animals yes  Patient understands his/her participation is voluntary yes  Patient washes hands before animal contact yes  Patient washes hands after animal contact yes  Behavioral Response: Engaged, Appropriate   Education: Contractor, Appropriate Animal Interaction   Education Outcome: Acknowledges education.   Clinical Observations/Feedback: Patient actively engaged in session, petting therapy dog appropriately from floor level and interacting with peers appropriately.   Laureen Ochs Katie Faraone, LRT/CTRS  Lane Hacker 11/18/2014 4:26 PM

## 2014-11-18 NOTE — BHH Group Notes (Signed)
0900 nursing orientation group  The focus of this group is to educate the patient on the purpose and policies of crisis stabilization and provide a format to answer questions about their admission.  The group details unit policies and expectations of patients while admitted.  Pt was an active participant in class and was appropriate in her responses to the group.

## 2014-11-18 NOTE — Progress Notes (Signed)
Pt has been up for all groups and activities on the unit.  She rated her depression 2 hopelessness a 1 and anxiety a 6 on her self-inventory.  She denied any w/d symptoms, S/H ideation or A/V/H. She did request norco twice last 1437 ad librium 25mg  at 0802 which relieved her symptoms. She wanted to leave today but Dr. Sabra Heck wanted her to have more time since starting on the Antabuse and campral and he felt she did not have a good plan in place.  Pt voiced understanding.

## 2014-11-18 NOTE — Progress Notes (Signed)
D: Pt is appropriate in affect and pleasant in mood. Pt actively participates within the milieu. Pt is currently negative for any SI/HI/AVH. Pt is planning to return home and participate in outpatient services. Pt reports that her FMLA is still pending. Pt show eagerness in understanding the difference b/w her Campral and Antabuse.  A: Writer administered scheduled and prn medications to pt, per MD orders . Writer reviewed medication indications with pt for clarity. Continued support and availability as needed was extended to this pt. Staff continue to monitor pt with q28min checks.  R: No adverse drug reactions noted. Pt receptive to treatment. Pt remains safe at this time.
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
## 2014-11-19 ENCOUNTER — Encounter (HOSPITAL_COMMUNITY): Payer: Self-pay | Admitting: Registered Nurse

## 2014-11-19 DIAGNOSIS — F322 Major depressive disorder, single episode, severe without psychotic features: Secondary | ICD-10-CM | POA: Insufficient documentation

## 2014-11-19 MED ORDER — MIRTAZAPINE 7.5 MG PO TABS: 7.5000 mg | ORAL_TABLET | Freq: Every day | ORAL | Status: DC

## 2014-11-19 MED ORDER — LORAZEPAM 0.5 MG PO TABS: 0.5000 mg | ORAL_TABLET | Freq: Three times a day (TID) | ORAL | Status: DC | PRN

## 2014-11-19 MED ORDER — ACAMPROSATE CALCIUM 333 MG PO TBEC: 666.0000 mg | DELAYED_RELEASE_TABLET | Freq: Three times a day (TID) | ORAL | Status: DC

## 2014-11-19 MED ORDER — LAMOTRIGINE 25 MG PO TABS: 25.0000 mg | ORAL_TABLET | Freq: Every day | ORAL | Status: DC

## 2014-11-19 MED ORDER — LOPERAMIDE HCL 2 MG PO CAPS
ORAL_CAPSULE | ORAL | Status: AC
  Filled 2014-11-19: qty 1

## 2014-11-19 MED ORDER — HYDROXYZINE HCL 25 MG PO TABS: 25.0000 mg | ORAL_TABLET | Freq: Three times a day (TID) | ORAL | Status: DC | PRN

## 2014-11-19 MED ORDER — DISULFIRAM 250 MG PO TABS
250.0000 mg | ORAL_TABLET | Freq: Every day | ORAL | Status: DC
Start: 2014-11-19 — End: 2015-11-13

## 2014-11-19 MED ORDER — DICLOFENAC SODIUM 50 MG PO TBEC: 50.0000 mg | DELAYED_RELEASE_TABLET | Freq: Two times a day (BID) | ORAL | Status: DC

## 2014-11-19 MED ORDER — LOPERAMIDE HCL 2 MG PO CAPS
2.0000 mg | ORAL_CAPSULE | ORAL | Status: DC | PRN
  Administered 2014-11-19: 2 mg via ORAL

## 2014-11-19 MED ORDER — ESCITALOPRAM OXALATE 20 MG PO TABS: 20.0000 mg | ORAL_TABLET | Freq: Every day | ORAL | Status: DC

## 2014-11-19 NOTE — BHH Suicide Risk Assessment (Signed)
[facility] Discharge Suicide Risk Assessment   Demographic Factors:  Caucasian  Total Time spent with patient: 30 minutes  Musculoskeletal: Strength & Muscle Tone: within normal limits Gait & Station: normal Patient leans: N/A  Psychiatric Specialty Exam: Physical Exam  Review of Systems  Constitutional: Negative.   HENT: Negative.   Eyes: Negative.   Respiratory: Negative.   Cardiovascular: Negative.   Gastrointestinal: Negative.   Genitourinary: Negative.   Musculoskeletal: Negative.   Skin: Negative.   Neurological: Negative.   Endo/Heme/Allergies: Negative.   Psychiatric/Behavioral: Positive for depression and substance abuse. The patient is nervous/anxious.     Blood pressure 116/79, pulse 82, temperature 97.8 F (36.6 C), temperature source Oral, resp. rate 18, height 5' (1.524 m), weight 63.05 kg (139 lb), last menstrual period 08/09/2013.Body mass index is 27.15 kg/(m^2).  General Appearance: Fairly Groomed  Engineer, water::  Fair  Speech:  Clear and [id number]  Volume:  Normal  Mood:  Anxious and worreid but states she is ready to go home  Affect:  Appropriate  Thought Process:  Coherent and Goal Directed  Orientation:  Full (Time, Place, and Person)  Thought Content:  plans as she moves on, relapse prevention plan  Suicidal Thoughts:  No  Homicidal Thoughts:  No  Memory:  Immediate;   Fair Recent;   Fair Remote;   Fair  Judgement:  Fair  Insight:  Present  Psychomotor Activity:  Normal  Concentration:  Fair  Recall:  [employer] of [place]  Language: Fair  Akathisia:  No  Handed:  Right  AIMS (if indicated):     Assets:  Desire for Improvement Housing Social Support Vocational/Educational  Sleep:  Number of Hours: 6.75  Cognition: WNL  ADL's:  Intact   Have you used any form of tobacco in the last 30 days? (Cigarettes, Smokeless Tobacco, Cigars, and/or Pipes): No  Has this patient used any form of tobacco in the last 30 days? (Cigarettes,  Smokeless Tobacco, Cigars, and/or Pipes) No  Mental Status Per Nursing Assessment::   On Admission:     Current Mental Status by Physician: In full contact with reality. There are no active S/S of withdrawal. There ere no active SI plans or intent. She states she is committed to abstinence. She also wants to continue to work on her anxiety and depression. She is not going to go back to her house right now. She states she is serious about not enabling her husband anymore. She is also going to pursue further outpatient treatment for her anxiety and depression. She hopes to mend the relationship with her kids.   Loss Factors: Loss of significant relationship  Historical Factors: NA  Risk Reduction Factors:   Sense of responsibility to family, Employed and Positive social support  Continued Clinical Symptoms:  Severe Anxiety and/or Agitation Depression:   Comorbid alcohol abuse/dependence Alcohol/Substance Abuse/Dependencies  Cognitive Features That Contribute To Risk:  Closed-mindedness, Polarized thinking and Thought constriction (tunnel vision)    Suicide Risk:  Minimal: No identifiable suicidal ideation.  Patients presenting with no risk factors but with morbid ruminations; may be classified as minimal risk based on the severity of the depressive symptoms  Principal Problem: Major depressive disorder, single episode, severe Discharge Diagnoses:  Patient Active Problem List   Diagnosis Date Noted  . Major depressive disorder, single episode, severe [F32.2] 11/14/2014  . Generalized anxiety disorder [F41.1] 11/14/2014  . Alcohol use disorder, severe, dependence [F10.20] 11/14/2014    Class: Chronic  . Other hammer toe (acquired) [M20.40]  05/06/2014  . Pronation deformity of ankle, acquired [M21.6X9] 05/06/2014  . Chronic cough [R05] 12/01/2013  . Essential hypertension, benign [I10] 11/13/2012  . Depression [F32.9] 11/13/2012  . GAD (generalized anxiety disorder) [F41.1]  11/13/2012  . GERD (gastroesophageal reflux disease) [K21.9] 11/13/2012  . Insomnia [G47.00] 11/13/2012  . Post-operative state [Z98.89] 10/30/2012    Follow-up Information    Follow up with [facility].   Why:  Patient agreeable to contact [facility] at discharge to request medication management and therapy appointments (first available). Will need to have credit card available to schedule appointment.   Contact information:   [address] [place], [place], [place] [address] [phone]      Plan Of Care/Follow-up recommendations:  Activity:  as tolerated Diet:  regular Follow up [facility]  Is patient on multiple antipsychotic therapies at discharge:  No   Has Patient had three or more failed trials of antipsychotic monotherapy by history:  No  Recommended Plan for Multiple Antipsychotic Therapies: NA    Johanthan Kneeland A 11/19/2014, 12:33 PM

## 2014-11-19 NOTE — BHH Group Notes (Addendum)
   [facility] LCSW Aftercare Discharge Planning Group Note  11/19/2014  8:45 AM   Participation Quality: Alert, Appropriate and Oriented  Mood/Affect: Appropriate  Depression Rating: 2  Anxiety Rating: Patient reports a low level of anxiety today   Thoughts of Suicide: Pt denies SI/HI  Will you contract for safety? Yes  Current AVH: Pt denies  Plan for Discharge/Comments: Pt attended discharge planning group and actively participated in group. CSW provided pt with today's workbook. Patient reports that she slept well last night and feels ready to discharge home with her husband. Patient is agreeable to contact [facility] at discharge with her credit card information to schedule follow up appointment.  Transportation Means: Pt reports access to transportation  Supports: No supports mentioned at this time  Tilden Fossa, MSW, [place] Social Worker [employer] [phone]

## 2014-11-19 NOTE — Progress Notes (Signed)
Recreation Therapy Notes  Date: 04.13.2016 Time: 9:30am Location: [address] Group Room   Group Topic: Stress Management  Goal Area(s) Addresses:  Patient will actively participate in stress management techniques presented during session.   Behavioral Response: Appropriate   Intervention: Stress management techniques  Activity :  Deep Breathing and Progressive Muscle Relaxation. LRT provided instruction and demonstration on practice of Progressive Muscle Relaxation. Technique was coupled with deep breathing.   Education:  Stress Management, Discharge Planning.   Education Outcome: Acknowledges education  Clinical Observations/Feedback: Patient practiced technique without difficulty and expressed she felt comfortable practicing post d/c.   Laureen Ochs Kiya Eno, LRT/CTRS  Lane Hacker 11/19/2014 3:18 PM

## 2014-11-19 NOTE — [facility] Group Notes (Signed)
[place] LCSW Group Therapy 11/19/2014  1:15 PM Type of Therapy: Group Therapy Participation Level: Active  Participation Quality: Attentive, Sharing and Supportive  Affect: Appropriate   Cognitive: Alert and Oriented  Insight: Developing/Improving and Engaged  Engagement in Therapy: Developing/Improving and Engaged  Modes of Intervention: Clarification, Confrontation, Discussion, Education, Exploration, Limit-setting, Orientation, Problem-solving, Rapport Building, Art therapist, Socialization and Support  Summary of Progress/Problems: The topic for group today was emotional regulation. This group focused on both positive and negative emotion identification and allowed group members to process ways to identify feelings, regulate negative emotions, and find healthy ways to manage internal/external emotions. Group members were asked to reflect on a time when their reaction to an emotion led to a negative outcome and explored how alternative responses using emotion regulation would have benefited them. Group members were also asked to discuss a time when emotion regulation was utilized when a negative emotion was experienced. Patient engaged in discussion about emotional triggers and healthy vs. Unhealthy coping skills. Patient shared that she is going to try not to "lash out" at others when angry and will try to pause and think about her words first. CSW and other group members provided patient with emotional support and encouragement.   Tilden Fossa, MSW, [place] Worker [facility] [phone]

## 2014-11-19 NOTE — Progress Notes (Signed)
Discharge Note:  Patient discharged home.  Denied SI and HI.  Denied A/V hallucinations.  Suicide prevention information given and discussed with patient who stated she understood and had no questions.  Patient stated she received all her belongings.  Patient stated she appreciated all assistance received from [facility] staff.

## 2014-11-19 NOTE — Progress Notes (Signed)
D:  Patient's self inventory sheet, patient sleeps good, sleep medication is helpful.  Fair appetite, normal energy, good concentration.  Rated depression 4, hopeless 1, anxiety 6.  Withdrawals tremors, bad anxiety.  Denied SI.  Physical problems lower back pain, worst pain in past 24 hours #5.  Pain medication is helpful.  Goal is to talk to SW and MD, go to groups.  Does have discharge plans.  No problems anticipated after discharge. A:  Medications administered per MD orders.  Emotional support and encouragement given patient. R:  Denied SI and HI, contracts for safety.  Denied A/V hallucinations.  Safety maintained with 15 minute checks. Patient is looking forward to discharge today.  Splint on R wrist.
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
## 2014-11-19 NOTE — Progress Notes (Signed)
  [facility] Adult Case Management Discharge Plan :  Will you be returning to the same living situation after discharge:  Yes,  patient plans to return home At discharge, do you have transportation home?: Yes,  patient reports that her husband will pick her up Do you have the ability to pay for your medications: Yes,  patient will be provided with medication samples and prescriptions.  Release of information consent forms completed and in the chart;  Patient's signature needed at discharge.  Patient to Follow up at: Follow-up Information    Follow up with [facility].   Why:  Patient agreeable to contact [facility] at discharge to request medication management and therapy appointments (first available). Will need to have credit card available to schedule appointment.   Contact information:   [address] [place], [place], [place] [address] [phone]      Patient denies SI/HI: Yes,  denies    Safety Planning and Suicide Prevention discussed: Yes,  with patient and husband  Have you used any form of tobacco in the last 30 days? (Cigarettes, Smokeless Tobacco, Cigars, and/or Pipes): No  Has patient been referred to the Quitline?: N/A patient is not a smoker  Jaspreet Bodner L 11/19/2014, 9:54 AM

## 2014-11-24 NOTE — Progress Notes (Signed)
Patient Discharge Instructions:  After Visit Summary (AVS):   Faxed to:  11/24/14 Discharge Summary Note:   Faxed to:  11/24/14 Psychiatric Admission Assessment Note:   Faxed to:  11/24/14 Suicide Risk Assessment - Discharge Assessment:   Faxed to:  11/24/14 Faxed/Sent to the Next Level Care provider:  11/24/14 Faxed to [facility] @ [place], 11/24/2014, 2:15 PM

## 2015-02-17 ENCOUNTER — Encounter: Payer: Self-pay | Admitting: Physician Assistant

## 2015-02-17 ENCOUNTER — Encounter (INDEPENDENT_AMBULATORY_CARE_PROVIDER_SITE_OTHER): Payer: Self-pay

## 2015-02-17 ENCOUNTER — Ambulatory Visit (INDEPENDENT_AMBULATORY_CARE_PROVIDER_SITE_OTHER): Payer: BLUE CROSS/BLUE SHIELD | Admitting: Physician Assistant

## 2015-02-17 VITALS — BP 102/71 | HR 108 | Temp 97.2°F | Ht 60.0 in | Wt 125.0 lb

## 2015-02-17 DIAGNOSIS — F322 Major depressive disorder, single episode, severe without psychotic features: Secondary | ICD-10-CM

## 2015-02-17 DIAGNOSIS — F411 Generalized anxiety disorder: Secondary | ICD-10-CM

## 2015-02-17 DIAGNOSIS — F32A Depression, unspecified: Secondary | ICD-10-CM

## 2015-02-17 DIAGNOSIS — G4700 Insomnia, unspecified: Secondary | ICD-10-CM | POA: Diagnosis not present

## 2015-02-17 DIAGNOSIS — F329 Major depressive disorder, single episode, unspecified: Secondary | ICD-10-CM

## 2015-02-17 MED ORDER — ESCITALOPRAM OXALATE 20 MG PO TABS: 20.0000 mg | ORAL_TABLET | Freq: Every day | ORAL | Status: DC

## 2015-02-17 MED ORDER — TRAZODONE HCL 50 MG PO TABS: 25.0000 mg | ORAL_TABLET | Freq: Every evening | ORAL | Status: DC | PRN

## 2015-02-17 MED ORDER — ALPRAZOLAM 0.25 MG PO TABS: 0.2500 mg | ORAL_TABLET | Freq: Three times a day (TID) | ORAL | Status: DC | PRN

## 2015-02-17 NOTE — Progress Notes (Signed)
Subjective:    Patient ID: [name], female    DOB: 1961/06/13, 54 y.o.   MRN: [id number]  HPI 54 y/o female presents for refills of medication. She saw a Dr. Gus Height, psychiatrist 2 weeks ago and states that all of her medications were changed. She does not like the psychiatrist and is changing to Dr. Chucky May. Appt Nov 10, 3:45. Patient states that she is not taking the medications that were prescribed by Dr. Gus Height and she would like her Lexapro, xanax and Lorrin Mais refilled until she can be seen by her new psychiatrist in November.   She was hospitalized in April for suicidal ideations. She states that she took pills, drank alcohol and fell asleep. She has a h/o GAD, severe alcohol use d/o, major depressive disorder.    Review of Systems  Constitutional: Negative.   HENT: Negative.   Gastrointestinal: Negative.   Psychiatric/Behavioral: Positive for sleep disturbance (insomnia) and dysphoric mood. Negative for suicidal ideas, hallucinations and self-injury. The patient is nervous/anxious.        Objective:   Physical Exam  Constitutional: She is oriented to person, place, and time. She appears well-developed and well-nourished.  HENT:  Head: Normocephalic and atraumatic.  Eyes: Pupils are equal, round, and reactive to light.  Cardiovascular: Normal rate, regular rhythm, normal heart sounds and intact distal pulses.  Exam reveals no gallop and no friction rub.   No murmur heard. Pulmonary/Chest: Effort normal and breath sounds normal. No respiratory distress. She has no wheezes. She has no rales. She exhibits no tenderness.  Musculoskeletal: Normal range of motion. She exhibits no edema or tenderness.  Neurological: She is alert and oriented to person, place, and time.  Psychiatric: She has a normal mood and affect. Her behavior is normal. Judgment and thought content normal.  Calm, cooperative   Nursing note and vitals reviewed.         Assessment & Plan:  1. Major  depressive disorder, single episode, severe without psychotic features  - escitalopram (LEXAPRO) 20 MG tablet; Take 1 tablet (20 mg total) by mouth daily. For depression  Dispense: 30 tablet; Refill: 3  2. Insomnia  - traZODone (DESYREL) 50 MG tablet; Take 0.5-1 tablets (25-50 mg total) by mouth at bedtime as needed for sleep.  Dispense: 30 tablet; Refill: 3  3. Depression  - escitalopram (LEXAPRO) 20 MG tablet; Take 1 tablet (20 mg total) by mouth daily. For depression  Dispense: 30 tablet; Refill: 3  4. Generalized anxiety disorder  - ALPRAZolam (XANAX) 0.25 MG tablet; Take 1 tablet (0.25 mg total) by mouth 3 (three) times daily as needed for anxiety.  Dispense: 90 tablet; Refill: 3  Patient states that she will not take any of the psych meds that were prescribed from her most recent psychiatrist. Although hesitant to make changes today, I do not want her to stop all of her medications. I have refilled the Lexapro and changed Ativan to the lowest dose xanax. I will not prescribe her Ambien since this is the medication she attempted suicide with in April according to notes. Instead, I have prescribed Trazadone. I have also encouraged her to take her Lamictal as prescribed and to give the medications time to work before discontinuing them. Medications given today should last until her appt with her new psychiatrist in November. I have also encouraged her to call often to see if there are cancellations so she may get seen earlier.      Epiphany Seltzer A. Benjamin Stain PA-C

## 2015-02-20 ENCOUNTER — Ambulatory Visit: Payer: BLUE CROSS/BLUE SHIELD | Admitting: Family

## 2015-07-08 ENCOUNTER — Ambulatory Visit (INDEPENDENT_AMBULATORY_CARE_PROVIDER_SITE_OTHER): Payer: BLUE CROSS/BLUE SHIELD | Admitting: Podiatry

## 2015-07-08 ENCOUNTER — Encounter: Payer: Self-pay | Admitting: Podiatry

## 2015-07-08 ENCOUNTER — Ambulatory Visit: Payer: BLUE CROSS/BLUE SHIELD | Admitting: Podiatry

## 2015-07-08 VITALS — BP 135/89 | HR 76 | Ht 60.0 in | Wt 122.0 lb

## 2015-07-08 DIAGNOSIS — M2040 Other hammer toe(s) (acquired), unspecified foot: Secondary | ICD-10-CM

## 2015-07-08 DIAGNOSIS — M21619 Bunion of unspecified foot: Secondary | ICD-10-CM

## 2015-07-08 DIAGNOSIS — M216X9 Other acquired deformities of unspecified foot: Secondary | ICD-10-CM

## 2015-07-08 DIAGNOSIS — M2041 Other hammer toe(s) (acquired), right foot: Secondary | ICD-10-CM

## 2015-07-08 NOTE — Patient Instructions (Signed)
Will make a new pair orthotics.  No change in foot function following left foot MCJ fusion procedure in 2014.  Will call when orthotics are ready.

## 2015-07-08 NOTE — Progress Notes (Signed)
  Hammer toe 4th right, Enlarged medial eminence left great toe. S/P Lapidus fusion 2014 left foot. Need new pair orthotics.  Subjective: 54 y.o. year old female patient presents requesting a new pair orthotics. She has had current one made over a year ago.  S/P Fusion of the first Metatarsocuneiform joint (2014)  Objective: Full range of motion at ankle and rearfoot. First ray is in line with the 2nd metatarsal. No motion at the fusion site. No edema or erythema surrounding surgical site. Pain in contracted 4th digit right. Enlarged medial eminence of the first metatarsal left.  Post Lapidus fusion 2014.  Assessment: Stable first MCJ bilateral. Enlarged medial eminence left. Hammer toe painful 4th right.  Plan:  Both feet casted for orthotics. May repair Hammer toe and recurring bunion if symptoms persist.

## 2015-08-03 ENCOUNTER — Emergency Department (HOSPITAL_COMMUNITY): Payer: BLUE CROSS/BLUE SHIELD

## 2015-08-03 ENCOUNTER — Encounter (HOSPITAL_COMMUNITY): Payer: Self-pay | Admitting: *Deleted

## 2015-08-03 ENCOUNTER — Emergency Department (HOSPITAL_COMMUNITY)
Admission: EM | Admit: 2015-08-03 | Discharge: 2015-08-04 | Disposition: A | Discharge status: Home / Self Care | Payer: BLUE CROSS/BLUE SHIELD | Attending: Emergency Medicine | Admitting: Emergency Medicine

## 2015-08-03 DIAGNOSIS — S72114A Nondisplaced fracture of greater trochanter of right femur, initial encounter for closed fracture: Secondary | ICD-10-CM | POA: Insufficient documentation

## 2015-08-03 DIAGNOSIS — Y92009 Unspecified place in unspecified non-institutional (private) residence as the place of occurrence of the external cause: Secondary | ICD-10-CM | POA: Insufficient documentation

## 2015-08-03 DIAGNOSIS — Z79899 Other long term (current) drug therapy: Secondary | ICD-10-CM | POA: Diagnosis not present

## 2015-08-03 DIAGNOSIS — K219 Gastro-esophageal reflux disease without esophagitis: Secondary | ICD-10-CM | POA: Diagnosis not present

## 2015-08-03 DIAGNOSIS — Y9389 Activity, other specified: Secondary | ICD-10-CM | POA: Diagnosis not present

## 2015-08-03 DIAGNOSIS — I10 Essential (primary) hypertension: Secondary | ICD-10-CM | POA: Insufficient documentation

## 2015-08-03 DIAGNOSIS — W01198A Fall on same level from slipping, tripping and stumbling with subsequent striking against other object, initial encounter: Secondary | ICD-10-CM | POA: Diagnosis not present

## 2015-08-03 DIAGNOSIS — S01111A Laceration without foreign body of right eyelid and periocular area, initial encounter: Secondary | ICD-10-CM | POA: Insufficient documentation

## 2015-08-03 DIAGNOSIS — F329 Major depressive disorder, single episode, unspecified: Secondary | ICD-10-CM | POA: Insufficient documentation

## 2015-08-03 DIAGNOSIS — S199XXA Unspecified injury of neck, initial encounter: Secondary | ICD-10-CM | POA: Diagnosis not present

## 2015-08-03 DIAGNOSIS — S0181XA Laceration without foreign body of other part of head, initial encounter: Secondary | ICD-10-CM

## 2015-08-03 DIAGNOSIS — Y998 Other external cause status: Secondary | ICD-10-CM | POA: Diagnosis not present

## 2015-08-03 DIAGNOSIS — S0990XA Unspecified injury of head, initial encounter: Secondary | ICD-10-CM

## 2015-08-03 DIAGNOSIS — F10129 Alcohol abuse with intoxication, unspecified: Secondary | ICD-10-CM | POA: Insufficient documentation

## 2015-08-03 DIAGNOSIS — F10929 Alcohol use, unspecified with intoxication, unspecified: Secondary | ICD-10-CM

## 2015-08-03 HISTORY — DX: Unspecified convulsions: R56.9

## 2015-08-03 MED ORDER — LIDOCAINE-EPINEPHRINE-TETRACAINE (LET) SOLUTION
3.0000 mL | Freq: Once | NASAL | Status: AC
  Administered 2015-08-04: 3 mL via TOPICAL
  Filled 2015-08-03: qty 3

## 2015-08-03 NOTE — ED Notes (Signed)
Patient has C-Collar in placement at this time per PA Evalee Jefferson.
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
## 2015-08-04 MED ORDER — NAPROXEN 500 MG PO TABS: 500.0000 mg | ORAL_TABLET | Freq: Two times a day (BID) | ORAL | Status: DC | PRN

## 2015-08-04 NOTE — ED Notes (Signed)
Patient ambulated around nursing station, gait steady, patient moving slow. Patient is very sleepy at this time.

## 2015-08-04 NOTE — Discharge Instructions (Signed)
Facial Laceration ° A facial laceration is a cut on the face. These injuries can be painful and cause bleeding. Lacerations usually heal quickly, but they need special care to reduce scarring. °DIAGNOSIS  °Your health care provider will take a medical history, ask for details about how the injury occurred, and examine the wound to determine how deep the cut is. °TREATMENT  °Some facial lacerations may not require closure. Others may not be able to be closed because of an increased risk of infection. The risk of infection and the chance for successful closure will depend on various factors, including the amount of time since the injury occurred. °The wound may be cleaned to help prevent infection. If closure is appropriate, pain medicines may be given if needed. Your health care provider will use stitches (sutures), wound glue (adhesive), or skin adhesive strips to repair the laceration. These tools bring the skin edges together to allow for faster healing and a better cosmetic outcome. If needed, you may also be given a tetanus shot. °HOME CARE INSTRUCTIONS °· Only take over-the-counter or prescription medicines as directed by your health care provider. °· Follow your health care provider's instructions for wound care. These instructions will vary depending on the technique used for closing the wound. °For Sutures: °· Keep the wound clean and dry.   °· If you were given a bandage (dressing), you should change it at least once a day. Also change the dressing if it becomes wet or dirty, or as directed by your health care provider.   °· Wash the wound with soap and water 2 times a day. Rinse the wound off with water to remove all soap. Pat the wound dry with a clean towel.   °· After cleaning, apply a thin layer of the antibiotic ointment recommended by your health care provider. This will help prevent infection and keep the dressing from sticking.   °· You may shower as usual after the first 24 hours. Do not soak the  wound in water until the sutures are removed.   °· Get your sutures removed as directed by your health care provider. With facial lacerations, sutures should usually be taken out after 4-5 days to avoid stitch marks.   °· Wait a few days after your sutures are removed before applying any makeup. °For Skin Adhesive Strips: °· Keep the wound clean and dry.   °· Do not get the skin adhesive strips wet. You may bathe carefully, using caution to keep the wound dry.   °· If the wound gets wet, pat it dry with a clean towel.   °· Skin adhesive strips will fall off on their own. You may trim the strips as the wound heals. Do not remove skin adhesive strips that are still stuck to the wound. They will fall off in time.   °For Wound Adhesive: °· You may briefly wet your wound in the shower or bath. Do not soak or scrub the wound. Do not swim. Avoid periods of heavy sweating until the skin adhesive has fallen off on its own. After showering or bathing, gently pat the wound dry with a clean towel.   °· Do not apply liquid medicine, cream medicine, ointment medicine, or makeup to your wound while the skin adhesive is in place. This may loosen the film before your wound is healed.   °· If a dressing is placed over the wound, be careful not to apply tape directly over the skin adhesive. This may cause the adhesive to be pulled off before the wound is healed.   °· Avoid   prolonged exposure to sunlight or tanning lamps while the skin adhesive is in place.  The skin adhesive will usually remain in place for 5-10 days, then naturally fall off the skin. Do not pick at the adhesive film.  After Healing: Once the wound has healed, cover the wound with sunscreen during the day for 1 full year. This can help minimize scarring. Exposure to ultraviolet light in the first year will darken the scar. It can take 1-2 years for the scar to lose its redness and to heal completely.  SEEK MEDICAL CARE IF:  You have a fever. SEEK IMMEDIATE  MEDICAL CARE IF:  You have redness, pain, or swelling around the wound.   You see ayellowish-white fluid (pus) coming from the wound.    This information is not intended to replace advice given to you by your health care provider. Make sure you discuss any questions you have with your health care provider.   Document Released: 09/01/2004 Document Revised: 08/15/2014 Document Reviewed: 03/07/2013 Elsevier Interactive Patient Education 2016 [place] Injury, Adult You have a head injury. Headaches and throwing up (vomiting) are common after a head injury. It should be easy to wake up from sleeping. Sometimes you must stay in the hospital. Most problems happen within the first 24 hours. Side effects may occur up to 7-10 days after the injury.  WHAT ARE THE TYPES OF HEAD INJURIES? Head injuries can be as minor as a bump. Some head injuries can be more severe. More severe head injuries include:  A jarring injury to the brain (concussion).  A bruise of the brain (contusion). This mean there is bleeding in the brain that can cause swelling.  A cracked skull (skull fracture).  Bleeding in the brain that collects, clots, and forms a bump (hematoma). WHEN SHOULD I GET HELP RIGHT AWAY?   You are confused or sleepy.  You cannot be woken up.  You feel sick to your stomach (nauseous) or keep throwing up (vomiting).  Your dizziness or unsteadiness is getting worse.  You have very bad, lasting headaches that are not helped by medicine. Take medicines only as told by your doctor.  You cannot use your arms or legs like normal.  You cannot walk.  You notice changes in the black spots in the center of the colored part of your eye (pupil).  You have clear or bloody fluid coming from your nose or ears.  You have trouble seeing. During the next 24 hours after the injury, you must stay with someone who can watch you. This person should get help right away (call 911 in the [place] if  you start to shake and are not able to control it (have seizures), you pass out, or you are unable to wake up. HOW CAN I PREVENT A HEAD INJURY IN THE FUTURE?  Wear seat belts.  Wear a helmet while bike riding and playing sports like football.  Stay away from dangerous activities around the house. WHEN CAN I RETURN TO NORMAL ACTIVITIES AND ATHLETICS? See your doctor before doing these activities. You should not do normal activities or play contact sports until 1 week after the following symptoms have stopped:  Headache that does not go away.  Dizziness.  Poor attention.  Confusion.  Memory problems.  Sickness to your stomach or throwing up.  Tiredness.  Fussiness.  Bothered by bright lights or loud noises.  Anxiousness or depression.  Restless sleep. MAKE SURE YOU:   Understand these instructions.  Will  watch your condition.  Will get help right away if you are not doing well or get worse.   This information is not intended to replace advice given to you by your health care provider. Make sure you discuss any questions you have with your health care provider.   Document Released: 07/07/2008 Document Revised: 08/15/2014 Document Reviewed: 04/01/2013 Elsevier Interactive Patient Education 2016 Elsevier Inc[employer]  Hip Fracture A hip fracture is a fracture of the upper part of your thigh bone (femur).  CAUSES A hip fracture is caused by a direct blow to the side of your hip. This is usually the result of a fall but can occur in other circumstances, such as an automobile accident. RISK FACTORS There is an increased risk of hip fractures in people with:  An unsteady walking pattern (gait) and those with conditions that contribute to poor balance, such as Parkinson's disease or dementia.  Osteopenia and osteoporosis.  Cancer that spreads to the leg bones.  Certain metabolic diseases. SYMPTOMS  Symptoms of hip fracture include:  Pain over the injured hip.  Inability  to put weight on the leg in which the fracture occurred (although, some patients are able to walk after a hip fracture).  Toes and foot of the affected leg point outward when you lie down. DIAGNOSIS A physical exam can determine if a hip fracture is likely to have occurred. X-ray exams are needed to confirm the fracture and to look for other injuries. The X-ray exam can help to determine the type of hip fracture. Rarely, the fracture is not visible on an X-ray image and a CT scan or MRI will have to be done. TREATMENT  The treatment for a fracture is usually surgery. This means using a screw, nail, or rod to hold the bones in place.  HOME CARE INSTRUCTIONS Take all medicines as directed by your health care provider. SEEK MEDICAL CARE IF: Pain continues, even after taking pain medicine. MAKE SURE YOU:  Understand these instructions.   Will watch your condition.  Will get help right away if you are not doing well or get worse.   This information is not intended to replace advice given to you by your health care provider. Make sure you discuss any questions you have with your health care provider.   Document Released: 07/25/2005 Document Revised: 07/30/2013 Document Reviewed: 03/06/2013 Elsevier Interactive Patient Education 2016 [place] CT scans and plain xrays today reveal a small avulsion fracture of your right lateral hip at the greater trochanter which does not require surgery or any special treatment, but should heal on its own.  Be aware that this will be painful for several weeks.  Use ice packs as much as is comfortable for the next couple days which will help with pain and swelling.  Follow-up with your doctor for recheck in 5 days for  both a recheck of your injuries and or suture removal.  Refer to the resources below if you are interested in assistance with your alcohol addiction.  Behavioral Health Resources in the [facility]  Intensive Outpatient Programs: [facility]      [place]. [place], [place] Both a day and evening program       [facility]     [address]        [place], [place] [address] [phone]         [facility]: [facility]  [address] Lady Gary [facility]: [phone] or [phone] [address] [place], [place] [address] [email or web address]  Mobile Crisis Teams:                                        [facility]         Mobile Crisis Care Unit [phone]             [facility] [address] Lady Gary [place] [address] [place] [phone]  Self-Help/Support Groups: [facility] [employer] of support groups [phone] (call for more info)  Narcotics Anonymous (NA) Caring [facility] [address] [place] - 2 meetings at this location  Residential Treatment Programs:  [place]       [place] [address] [place], [place]  [address] [place]  [address] [place], [place] [address] [phone] Admissions: 8am-3pm M-F  In[facility] Substance [place]     [address] [place], [place] [address]       [phone]         [facility] [address] Jadene Pierini [place], [place]  [facility] [address] [place], [place]  [facility] - Intensive Outpatient      [address] [place] [phone]     [place], [place]         [facility] ([place]     [place], [place] or  [phone]  Residential Treatment Services (RTS)  [place], [place]  Fellowship [address]                                               [place]  [facility] Resources: [phone]               General Therapy                                                Domenic Schwab, PhD        [address] [place],  [address]         [place]   [address] [place],  [address] [phone]  [facility] [id number]  [place], [place] [address] [phone] Insurance/Medicaid/sponsorship through [facility] and Families                                              [address] [place]                                        [place], [place] [address]    Therapy/tele-psych/case         [place] [address], [place]  [address]  Adolescent/group home/case management [phone]                                           Rosette Reveal PhD       General therapy       Insurance   [phone]         Dr. Adele Schilder Insurance [address]- [place] [facility]/Residential Medicaid, sponsorship [phone]

## 2015-08-04 NOTE — ED Provider Notes (Signed)
CSN: [id number]     Arrival date & time 08/03/15  2138 History   First MD Initiated Contact with Patient 08/03/15 2315     Chief Complaint  Patient presents with  . Facial Laceration     (Consider location/radiation/quality/duration/timing/severity/associated sxs/prior Treatment) The history is provided by the patient. The history is limited by the condition of the patient.   [name] is a 54 y.o. female with a past medical history significant for hypertension, depression, seizure disorder presenting with head injury including forehead laceration secondary to fall prior to arrival.  She describes being in her home when she tripped and fell, landing against hardwood floor.  She endorses that she has been drinking alcohol "a little" prior to arrival.  However, nursing notes and EMS notes imply she has been drinking EtOH since this morning.  Also according to notes, patient's husband, who is not currently at bedside, expresses interest in wife getting assistance with her alcohol abuse, patient currently denies she has a problem with this and is not interested in assistance.  She reports headache at this time, neck pain and right hip pain since her fall.  She denies dizziness, confusion, focal weakness.  She is not on any blood thinning medications.  She has had no treatments prior to arrival.       Past Medical History  Diagnosis Date  . HTN (hypertension)   . GERD (gastroesophageal reflux disease)   . Depression   . Chest pain   . Seizures [facility])    Past Surgical History  Procedure Laterality Date  . Tubal ligation    . Foot surgery Right 9/10    right foot-bunion-hammertoe  . Tubes in ears    . Metatarsal osteotomy with bunionectomy Left 08/02/12    [facility]  . Lapidus fusion Left 08/02/12   Family History  Problem Relation Age of Onset  . Hypertension Mother   . Cancer Mother     kidney  . Diabetes Father   . Hypertension Father    Social History  Substance Use Topics   . Smoking status: Never Smoker   . Smokeless tobacco: Never Used  . Alcohol Use: 0.0 oz/week    0 Standard drinks or equivalent per week     Comment: on ocassion   OB History    No data available     Review of Systems  Unable to perform ROS: Other  Gastrointestinal: Negative for nausea and vomiting.  Musculoskeletal: Positive for arthralgias and neck pain.  Skin: Positive for wound.  Neurological: Positive for headaches. Negative for dizziness.      Allergies  Amlodipine and Cortisporin  Home Medications   Prior to Admission medications   Medication Sig Start Date End Date Taking? Authorizing Provider  acamprosate (CAMPRAL) 333 MG tablet Take 2 tablets (666 mg total) by mouth 3 (three) times daily. For alcoholism 11/19/14   Shuvon B Rankin, NP  ALPRAZolam (XANAX) 0.25 MG tablet Take 1 tablet (0.25 mg total) by mouth 3 (three) times daily as needed for anxiety. 02/17/15   Tiffany A Gann, PA-C  disulfiram (ANTABUSE) 250 MG tablet Take 1 tablet (250 mg total) by mouth daily. For alcoholism 11/19/14   Shuvon B Rankin, NP  escitalopram (LEXAPRO) 20 MG tablet Take 1 tablet (20 mg total) by mouth daily. For depression 02/17/15   Tiffany A Gann, PA-C  esomeprazole (NEXIUM) 20 MG capsule Take 20 mg by mouth daily at 12 noon.    Historical Provider, MD  HYDROcodone-acetaminophen [facility]) [phone]  MG per tablet Take 1 tablet by mouth 4 (four) times daily as needed. #360 FOR 90 DAYS FILLED ON 10/20/2014 10/20/14   Historical Provider, MD  hydrOXYzine (ATARAX/VISTARIL) 25 MG tablet Take 1 tablet (25 mg total) by mouth 3 (three) times daily as needed for anxiety. 11/19/14   Shuvon B Rankin, NP  lamoTRIgine (LAMICTAL) 25 MG tablet Take 1 tablet (25 mg total) by mouth daily. For mood control 11/19/14   Shuvon B Rankin, NP  levothyroxine (SYNTHROID, LEVOTHROID) 25 MCG tablet Take 25 mcg by mouth daily before breakfast.    Historical Provider, MD  LORazepam (ATIVAN) 0.5 MG tablet Take 1 tablet (0.5 mg  total) by mouth every 8 (eight) hours as needed for anxiety. 11/19/14   Shuvon B Rankin, NP  methocarbamol (ROBAXIN) 750 MG tablet  12/23/14   Historical Provider, MD  mirtazapine (REMERON) 7.5 MG tablet Take 1 tablet (7.5 mg total) by mouth at bedtime. For sleep and depression 11/19/14   Shuvon B Rankin, NP  Multiple Vitamin (MULTIVITAMIN WITH MINERALS) TABS tablet Take 1 tablet by mouth daily.    Historical Provider, MD  naproxen (NAPROSYN) 500 MG tablet Take 1 tablet (500 mg total) by mouth 2 (two) times daily as needed for moderate pain. 08/04/15   Evalee Jefferson, PA-C  topiramate (TOPAMAX) 50 MG tablet  12/18/14   Historical Provider, MD  traZODone (DESYREL) 50 MG tablet Take 0.5-1 tablets (25-50 mg total) by mouth at bedtime as needed for sleep. 02/17/15   Tiffany A Gann, PA-C  zolpidem (AMBIEN) 5 MG tablet  11/24/14   Historical Provider, MD   BP 102/77 mmHg  Pulse 99  Temp(Src) 98.7 F (37.1 C) (Oral)  Resp 16  Ht 5\' 2"  (1.575 m)  Wt 55.339 kg  BMI 22.31 kg/m2  SpO2 99%  LMP 08/09/2013 Physical Exam  Constitutional: She appears well-developed and well-nourished.  Answers questions appropriately.  She is clearly intoxicated.  HENT:  Head: Normocephalic.  Right Ear: No hemotympanum.  Left Ear: No hemotympanum.  Nose: Nose normal.  Mouth/Throat: Oropharynx is clear and moist.  Laceration at right eyebrow and temple, hemostatic.  Bruising and swelling of right upper eyelid.  Eyes: Conjunctivae are normal.   EOMs intact, perrl. No scleral injury.  Neck: Normal range of motion. Spinous process tenderness present.  Tender to palpation midline lower cervical spine.  No palpable deformity or step-offs.  Cardiovascular: Normal rate, regular rhythm, normal heart sounds and intact distal pulses.   Pulmonary/Chest: Effort normal and breath sounds normal. She has no decreased breath sounds. She has no wheezes. She exhibits no tenderness.  Abdominal: Soft. Bowel sounds are normal. There is no  tenderness. There is no guarding.  Musculoskeletal: Normal range of motion.       Right hip: She exhibits bony tenderness and crepitus. She exhibits no swelling and no deformity.  Tender to palpation right lateral hip at the greater trochanter.  No edema, no bruising or hematoma.  Neurological: She is alert.  Skin: Skin is warm and dry.  2 cm linear laceration, horizontally placed above right eyebrow, extending to the temple.  Psychiatric: She has a normal mood and affect.  Nursing note and vitals reviewed.   ED Course  Procedures (including critical care time)  LACERATION REPAIR Performed by: Evalee Jefferson Authorized by: Evalee Jefferson Consent: Verbal consent obtained. Risks and benefits: risks, benefits and alternatives were discussed Consent given by: patient Patient identity confirmed: provided demographic data Prepped and Draped in normal sterile fashion Wound explored  Laceration Location: right eyebrow  Laceration Length: 3 cm  No Foreign Bodies seen or palpated  Anesthesia: topical let Local anesthetic: LET  Anesthetic total: 4 cc Irrigation method: syringe Amount of cleaning: standard use saf clense  Wound cleaner then saline  Skin closure: vicryl rapide 5-0 for #2 subcutaneous sutures.  #6 6-0 ethilon for surface sutures  Number of sutures: 6 + 2  Technique: simple interrupted  Patient tolerance: Patient tolerated the procedure well with no immediate complications.  Labs Review Labs Reviewed - No data to display  Imaging Review Ct Head Wo Contrast  08/04/2015  CLINICAL DATA:  Status post fall, with laceration at the right orbit. Headache and bilateral neck stiffness. Altered mental status. Initial encounter. EXAM: CT HEAD WITHOUT CONTRAST CT CERVICAL SPINE WITHOUT CONTRAST TECHNIQUE: Multidetector CT imaging of the head and cervical spine was performed following the standard protocol without intravenous contrast. Multiplanar CT image reconstructions of the  cervical spine were also generated. COMPARISON:  None. FINDINGS: CT HEAD FINDINGS There is no evidence of acute infarction, mass lesion, or intra- or extra-axial hemorrhage on CT. The posterior fossa, including the cerebellum, brainstem and fourth ventricle, is within normal limits. The third and lateral ventricles, and basal ganglia are unremarkable in appearance. The cerebral hemispheres are symmetric in appearance, with normal gray-white differentiation. No mass effect or midline shift is seen. There is no evidence of fracture; visualized osseous structures are unremarkable in appearance. The visualized portions of the orbits are within normal limits. There is complete opacification of the right mastoid air cells. The paranasal sinuses and left mastoid air cells are well-aerated. Soft tissue swelling is noted lateral to the right orbit. CT CERVICAL SPINE FINDINGS There is no evidence of fracture or subluxation. Vertebral bodies demonstrate normal height and alignment. Minimal disc space narrowing is noted along the lower cervical spine, with anterior and posterior disc osteophyte complexes. Prevertebral soft tissues are within normal limits. The visualized neural foramina are grossly unremarkable. The thyroid gland is unremarkable in appearance. The visualized lung apices are clear. No significant soft tissue abnormalities are seen. IMPRESSION: 1. No evidence of traumatic intracranial injury or fracture. 2. No evidence of fracture or subluxation along the cervical spine. 3. Soft tissue swelling noted lateral to the right orbit. 4. Complete opacification of the right mastoid air cells. 5. Minimal degenerative change at the lower cervical spine. Electronically Signed   By: Garald Balding M.D.   On: 08/04/2015 01:30   Ct Cervical Spine Wo Contrast  08/04/2015  CLINICAL DATA:  Status post fall, with laceration at the right orbit. Headache and bilateral neck stiffness. Altered mental status. Initial encounter.  EXAM: CT HEAD WITHOUT CONTRAST CT CERVICAL SPINE WITHOUT CONTRAST TECHNIQUE: Multidetector CT imaging of the head and cervical spine was performed following the standard protocol without intravenous contrast. Multiplanar CT image reconstructions of the cervical spine were also generated. COMPARISON:  None. FINDINGS: CT HEAD FINDINGS There is no evidence of acute infarction, mass lesion, or intra- or extra-axial hemorrhage on CT. The posterior fossa, including the cerebellum, brainstem and fourth ventricle, is within normal limits. The third and lateral ventricles, and basal ganglia are unremarkable in appearance. The cerebral hemispheres are symmetric in appearance, with normal gray-white differentiation. No mass effect or midline shift is seen. There is no evidence of fracture; visualized osseous structures are unremarkable in appearance. The visualized portions of the orbits are within normal limits. There is complete opacification of the right mastoid air cells. The paranasal sinuses and left  mastoid air cells are well-aerated. Soft tissue swelling is noted lateral to the right orbit. CT CERVICAL SPINE FINDINGS There is no evidence of fracture or subluxation. Vertebral bodies demonstrate normal height and alignment. Minimal disc space narrowing is noted along the lower cervical spine, with anterior and posterior disc osteophyte complexes. Prevertebral soft tissues are within normal limits. The visualized neural foramina are grossly unremarkable. The thyroid gland is unremarkable in appearance. The visualized lung apices are clear. No significant soft tissue abnormalities are seen. IMPRESSION: 1. No evidence of traumatic intracranial injury or fracture. 2. No evidence of fracture or subluxation along the cervical spine. 3. Soft tissue swelling noted lateral to the right orbit. 4. Complete opacification of the right mastoid air cells. 5. Minimal degenerative change at the lower cervical spine. Electronically Signed    By: Garald Balding M.D.   On: 08/04/2015 01:30   Dg Hip Unilat W Or W/o Pelvis 2-3 Views Right  08/04/2015  CLINICAL DATA:  Status post fall, with right hip tenderness. Initial encounter. EXAM: DG HIP (WITH OR WITHOUT PELVIS) 2-3V RIGHT COMPARISON:  None. FINDINGS: A small osseous fragment at the lateral edge of the right greater femoral trochanter may reflect a small avulsion fracture. The right femoral head remains seated at the acetabulum. The left hip joint is unremarkable in appearance. The sacroiliac joints are within normal limits. The visualized bowel gas pattern is grossly unremarkable. A tubal ligation clip is noted. IMPRESSION: Small osseous fragment at the lateral edge of the right greater femoral trochanter may reflect a small avulsion fracture. Electronically Signed   By: Garald Balding M.D.   On: 08/04/2015 01:07   I have personally reviewed and evaluated these images and lab results as part of my medical decision-making.   EKG Interpretation None      MDM   Final diagnoses:  Facial laceration, initial encounter  Minor head injury, initial encounter  Closed nondisplaced fracture of greater trochanter of right femur, initial encounter ([place])  Alcohol intoxication, with unspecified complication [facility])    Radiological studies were viewed, interpreted and considered during the medical decision making and disposition process. I agree with radiologists reading.  Results were also discussed with patient.   Pt was ambulated in department and was stable for dc home.  Husband to take pt home. Pt was given wound care instructions, suture removal in 5 days. Also given head injury instruction and behavioral health resources.  Advised f/u with pcp regarding greater trochanter fx.  Advised this is not a surgical injury and should heal without intervention.  Advised ice tx, anti inflammatories prescribed for pain relief.  Pt was seen by Dr. Leonides Schanz prior to dc home.    The patient appears  reasonably screened and/or stabilized for discharge and I doubt any other medical condition or other [facility] requiring further screening, evaluation, or treatment in the ED at this time prior to discharge.      Evalee Jefferson, PA-C 08/04/15 [place], DO 08/18/15 2348

## 2015-08-13 ENCOUNTER — Ambulatory Visit: Payer: BLUE CROSS/BLUE SHIELD | Admitting: Family Medicine

## 2015-08-24 ENCOUNTER — Other Ambulatory Visit: Payer: Self-pay | Admitting: Physician Assistant

## 2015-08-25 NOTE — Telephone Encounter (Signed)
Last seen 02/17/15 [name]   If approved route to nurse to call into [facility]

## 2015-09-05 ENCOUNTER — Other Ambulatory Visit: Payer: Self-pay | Admitting: Physician Assistant

## 2015-09-07 NOTE — Telephone Encounter (Signed)
Last seen 02/17/15  [name]  If approved route to nurse to call into [facility]

## 2015-09-21 ENCOUNTER — Telehealth: Payer: Self-pay | Admitting: Nurse Practitioner

## 2015-09-23 NOTE — Telephone Encounter (Signed)
Scheduled

## 2015-11-10 ENCOUNTER — Ambulatory Visit: Payer: BLUE CROSS/BLUE SHIELD | Admitting: Family Medicine

## 2015-11-13 ENCOUNTER — Ambulatory Visit (INDEPENDENT_AMBULATORY_CARE_PROVIDER_SITE_OTHER): Payer: BLUE CROSS/BLUE SHIELD | Admitting: Family

## 2015-11-13 ENCOUNTER — Encounter: Payer: Self-pay | Admitting: Nurse Practitioner

## 2015-11-13 ENCOUNTER — Encounter: Payer: Self-pay | Admitting: Family

## 2015-11-13 VITALS — BP 130/90 | HR 98 | Temp 99.8°F | Ht 62.0 in | Wt 124.0 lb

## 2015-11-13 DIAGNOSIS — R233 Spontaneous ecchymoses: Secondary | ICD-10-CM

## 2015-11-13 DIAGNOSIS — R7309 Other abnormal glucose: Secondary | ICD-10-CM | POA: Diagnosis not present

## 2015-11-13 DIAGNOSIS — K219 Gastro-esophageal reflux disease without esophagitis: Secondary | ICD-10-CM | POA: Diagnosis not present

## 2015-11-13 DIAGNOSIS — Z1159 Encounter for screening for other viral diseases: Secondary | ICD-10-CM | POA: Diagnosis not present

## 2015-11-13 DIAGNOSIS — Z114 Encounter for screening for human immunodeficiency virus [HIV]: Secondary | ICD-10-CM

## 2015-11-13 DIAGNOSIS — R238 Other skin changes: Secondary | ICD-10-CM

## 2015-11-13 DIAGNOSIS — R5383 Other fatigue: Secondary | ICD-10-CM | POA: Diagnosis not present

## 2015-11-13 DIAGNOSIS — Z1211 Encounter for screening for malignant neoplasm of colon: Secondary | ICD-10-CM

## 2015-11-13 DIAGNOSIS — F1020 Alcohol dependence, uncomplicated: Secondary | ICD-10-CM | POA: Diagnosis not present

## 2015-11-13 LAB — BAYER DCA HB A1C WAIVED: HB A1C: 4.9 % (ref <7.0)

## 2015-11-13 MED ORDER — ESOMEPRAZOLE MAGNESIUM 20 MG PO CPDR: 20.0000 mg | DELAYED_RELEASE_CAPSULE | Freq: Every day | ORAL | Status: DC

## 2015-11-13 NOTE — Progress Notes (Signed)
   Subjective:    Patient ID: [name], female    DOB: January 29, 1961, 55 y.o.   MRN: [id number]  HPI Pt presents to the office today for elevated blood glucose and bruising easily. Pt states her husband is a diabetic and she checked her glucose this week and it was 194. PT states she checked it in the AM fasting it was 83.   Pt states she has noticed an increased bruises on her legs and arms. Pt states she has noticed this for about a year, but seems to be getting worse. PT states she can not recall any injuries, but just notices bruising. Pt denies any headache, palpitations, SOB, or edema at this time.     Review of Systems  Constitutional: Negative.   HENT: Negative.   Eyes: Negative.   Respiratory: Negative.  Negative for shortness of breath.   Cardiovascular: Negative.  Negative for palpitations.  Gastrointestinal: Negative.   Endocrine: Negative.   Genitourinary: Negative.   Musculoskeletal: Negative.   Neurological: Negative.  Negative for headaches.  Hematological: Negative.   Psychiatric/Behavioral: Negative.   All other systems reviewed and are negative.      Objective:   Physical Exam  Constitutional: She is oriented to person, place, and time. She appears well-developed and well-nourished. No distress.  HENT:  Head: Normocephalic and atraumatic.  Right Ear: External ear normal.  Left Ear: External ear normal.  Nose: Nose normal.  Mouth/Throat: Oropharynx is clear and moist.  Eyes: Pupils are equal, round, and reactive to light.  Neck: Normal range of motion. Neck supple. No thyromegaly present.  Cardiovascular: Normal rate, regular rhythm, normal heart sounds and intact distal pulses.   No murmur heard. Pulmonary/Chest: Effort normal and breath sounds normal. No respiratory distress. She has no wheezes.  Abdominal: Soft. Bowel sounds are normal. She exhibits no distension. There is no tenderness.  Musculoskeletal: Normal range of motion. She exhibits no edema or  tenderness.  Neurological: She is alert and oriented to person, place, and time. She has normal reflexes. No cranial nerve deficit.  Skin: Skin is warm and dry.  Psychiatric: She has a normal mood and affect. Her behavior is normal. Judgment and thought content normal.  Vitals reviewed.   BP 130/90 mmHg  Pulse 98  Temp(Src) 99.8 F (37.7 C) (Oral)  Ht '5\' 2"'$  (1.575 m)  Wt 124 lb (56.246 kg)  BMI 22.67 kg/m2  LMP 08/09/2013       Assessment & Plan:  1. Gastroesophageal reflux disease, esophagitis presence not specified - BMP8+EGFR - esomeprazole (NEXIUM) 20 MG capsule; Take 1 capsule (20 mg total) by mouth daily at 12 noon.  Dispense: 90 capsule; Refill: 1  2. Alcohol use disorder, severe, dependence ([place]) - Hepatic function panel - BMP8+EGFR  3. Bruises easily - Anemia Profile B - Hepatic function panel - BMP8+EGFR  4. Other fatigue - Anemia Profile B - Thyroid Panel With TSH - BMP8+EGFR  5. Elevated glucose level - Bayer DCA Hb A1c Waived - BMP8+EGFR  6. Screening for HIV (human immunodeficiency virus) - HIV antibody - BMP8+EGFR  7. Need for hepatitis C screening test - Hepatitis C Antibody - BMP8+EGFR  8. Colon cancer screening - Fecal occult blood, imunochemical; Future - BMP8+EGFR   Continue all meds Labs pending Health Maintenance reviewed Diet and exercise encouraged RTO 6 months  Evelina Dun, FNP

## 2015-11-13 NOTE — Patient Instructions (Signed)
Health Maintenance, Female Adopting a healthy lifestyle and getting preventive care can go a long way to promote health and wellness. Talk with your health care provider about what schedule of regular examinations is right for you. This is a good chance for you to check in with your provider about disease prevention and staying healthy. In between checkups, there are plenty of things you can do on your own. Experts have done a lot of research about which lifestyle changes and preventive measures are most likely to keep you healthy. Ask your health care provider for more information. WEIGHT AND DIET  Eat a healthy diet  Be sure to include plenty of vegetables, fruits, low-fat dairy products, and lean protein.  Do not eat a lot of foods high in solid fats, added sugars, or salt.  Get regular exercise. This is one of the most important things you can do for your health.  Most adults should exercise for at least 150 minutes each week. The exercise should increase your heart rate and make you sweat (moderate-intensity exercise).  Most adults should also do strengthening exercises at least twice a week. This is in addition to the moderate-intensity exercise.  Maintain a healthy weight  Body mass index (BMI) is a measurement that can be used to identify possible weight problems. It estimates body fat based on height and weight. Your health care provider can help determine your BMI and help you achieve or maintain a healthy weight.  For females 20 years of age and older:   A BMI below 18.5 is considered underweight.  A BMI of 18.5 to 24.9 is normal.  A BMI of 25 to 29.9 is considered overweight.  A BMI of 30 and above is considered obese.  Watch levels of cholesterol and blood lipids  You should start having your blood tested for lipids and cholesterol at 54 years of age, then have this test every 5 years.  You may need to have your cholesterol levels checked more often if:  Your lipid  or cholesterol levels are high.  You are older than 54 years of age.  You are at high risk for heart disease.  CANCER SCREENING   Lung Cancer  Lung cancer screening is recommended for adults 55-80 years old who are at high risk for lung cancer because of a history of smoking.  A yearly low-dose CT scan of the lungs is recommended for people who:  Currently smoke.  Have quit within the past 15 years.  Have at least a 30-pack-year history of smoking. A pack year is smoking an average of one pack of cigarettes a day for 1 year.  Yearly screening should continue until it has been 15 years since you quit.  Yearly screening should stop if you develop a health problem that would prevent you from having lung cancer treatment.  Breast Cancer  Practice breast self-awareness. This means understanding how your breasts normally appear and feel.  It also means doing regular breast self-exams. Let your health care provider know about any changes, no matter how small.  If you are in your 20s or 30s, you should have a clinical breast exam (CBE) by a health care provider every 1-3 years as part of a regular health exam.  If you are 40 or older, have a CBE every year. Also consider having a breast X-ray (mammogram) every year.  If you have a family history of breast cancer, talk to your health care provider about genetic screening.  If you   are at high risk for breast cancer, talk to your health care provider about having an MRI and a mammogram every year.  Breast cancer gene (BRCA) assessment is recommended for women who have family members with BRCA-related cancers. BRCA-related cancers include:  Breast.  Ovarian.  Tubal.  Peritoneal cancers.  Results of the assessment will determine the need for genetic counseling and BRCA1 and BRCA2 testing. Cervical Cancer Your health care provider may recommend that you be screened regularly for cancer of the pelvic organs (ovaries, uterus, and  vagina). This screening involves a pelvic examination, including checking for microscopic changes to the surface of your cervix (Pap test). You may be encouraged to have this screening done every 3 years, beginning at age 21.  For women ages 30-65, health care providers may recommend pelvic exams and Pap testing every 3 years, or they may recommend the Pap and pelvic exam, combined with testing for human papilloma virus (HPV), every 5 years. Some types of HPV increase your risk of cervical cancer. Testing for HPV may also be done on women of any age with unclear Pap test results.  Other health care providers may not recommend any screening for nonpregnant women who are considered low risk for pelvic cancer and who do not have symptoms. Ask your health care provider if a screening pelvic exam is right for you.  If you have had past treatment for cervical cancer or a condition that could lead to cancer, you need Pap tests and screening for cancer for at least 20 years after your treatment. If Pap tests have been discontinued, your risk factors (such as having a new sexual partner) need to be reassessed to determine if screening should resume. Some women have medical problems that increase the chance of getting cervical cancer. In these cases, your health care provider may recommend more frequent screening and Pap tests. Colorectal Cancer  This type of cancer can be detected and often prevented.  Routine colorectal cancer screening usually begins at 54 years of age and continues through 54 years of age.  Your health care provider may recommend screening at an earlier age if you have risk factors for colon cancer.  Your health care provider may also recommend using home test kits to check for hidden blood in the stool.  A small camera at the end of a tube can be used to examine your colon directly (sigmoidoscopy or colonoscopy). This is done to check for the earliest forms of colorectal  cancer.  Routine screening usually begins at age 50.  Direct examination of the colon should be repeated every 5-10 years through 54 years of age. However, you may need to be screened more often if early forms of precancerous polyps or small growths are found. Skin Cancer  Check your skin from head to toe regularly.  Tell your health care provider about any new moles or changes in moles, especially if there is a change in a mole's shape or color.  Also tell your health care provider if you have a mole that is larger than the size of a pencil eraser.  Always use sunscreen. Apply sunscreen liberally and repeatedly throughout the day.  Protect yourself by wearing long sleeves, pants, a wide-brimmed hat, and sunglasses whenever you are outside. HEART DISEASE, DIABETES, AND HIGH BLOOD PRESSURE   High blood pressure causes heart disease and increases the risk of stroke. High blood pressure is more likely to develop in:  People who have blood pressure in the high end   of the normal range (130-139/85-89 mm Hg).  People who are overweight or obese.  People who are African American.  If you are 38-23 years of age, have your blood pressure checked every 3-5 years. If you are 61 years of age or older, have your blood pressure checked every year. You should have your blood pressure measured twice--once when you are at a hospital or clinic, and once when you are not at a hospital or clinic. Record the average of the two measurements. To check your blood pressure when you are not at a hospital or clinic, you can use:  An automated blood pressure machine at a pharmacy.  A home blood pressure monitor.  If you are between 45 years and 39 years old, ask your health care provider if you should take aspirin to prevent strokes.  Have regular diabetes screenings. This involves taking a blood sample to check your fasting blood sugar level.  If you are at a normal weight and have a low risk for diabetes,  have this test once every three years after 54 years of age.  If you are overweight and have a high risk for diabetes, consider being tested at a younger age or more often. PREVENTING INFECTION  Hepatitis B  If you have a higher risk for hepatitis B, you should be screened for this virus. You are considered at high risk for hepatitis B if:  You were born in a country where hepatitis B is common. Ask your health care provider which countries are considered high risk.  Your parents were born in a high-risk country, and you have not been immunized against hepatitis B (hepatitis B vaccine).  You have HIV or AIDS.  You use needles to inject street drugs.  You live with someone who has hepatitis B.  You have had sex with someone who has hepatitis B.  You get hemodialysis treatment.  You take certain medicines for conditions, including cancer, organ transplantation, and autoimmune conditions. Hepatitis C  Blood testing is recommended for:  Everyone born from [birth year] through 1964.  Anyone with known risk factors for hepatitis C. Sexually transmitted infections (STIs)  You should be screened for sexually transmitted infections (STIs) including gonorrhea and chlamydia if:  You are sexually active and are younger than 54 years of age.  You are older than 53 years of age and your health care provider tells you that you are at risk for this type of infection.  Your sexual activity has changed since you were last screened and you are at an increased risk for chlamydia or gonorrhea. Ask your health care provider if you are at risk.  If you do not have HIV, but are at risk, it may be recommended that you take a prescription medicine daily to prevent HIV infection. This is called pre-exposure prophylaxis (PrEP). You are considered at risk if:  You are sexually active and do not regularly use condoms or know the HIV status of your partner(s).  You take drugs by injection.  You are sexually  active with a partner who has HIV. Talk with your health care provider about whether you are at high risk of being infected with HIV. If you choose to begin PrEP, you should first be tested for HIV. You should then be tested every 3 months for as long as you are taking PrEP.  PREGNANCY   If you are premenopausal and you may become pregnant, ask your health care provider about preconception counseling.  If you may  become pregnant, take 400 to 800 micrograms (mcg) of folic acid every day.  If you want to prevent pregnancy, talk to your health care provider about birth control (contraception). OSTEOPOROSIS AND MENOPAUSE   Osteoporosis is a disease in which the bones lose minerals and strength with aging. This can result in serious bone fractures. Your risk for osteoporosis can be identified using a bone density scan.  If you are 61 years of age or older, or if you are at risk for osteoporosis and fractures, ask your health care provider if you should be screened.  Ask your health care provider whether you should take a calcium or vitamin D supplement to lower your risk for osteoporosis.  Menopause may have certain physical symptoms and risks.  Hormone replacement therapy may reduce some of these symptoms and risks. Talk to your health care provider about whether hormone replacement therapy is right for you.  HOME CARE INSTRUCTIONS   Schedule regular health, dental, and eye exams.  Stay current with your immunizations.   Do not use any tobacco products including cigarettes, chewing tobacco, or electronic cigarettes.  If you are pregnant, do not drink alcohol.  If you are breastfeeding, limit how much and how often you drink alcohol.  Limit alcohol intake to no more than 1 drink per day for nonpregnant women. One drink equals 12 ounces of beer, 5 ounces of wine, or 1 ounces of hard liquor.  Do not use street drugs.  Do not share needles.  Ask your health care provider for help if  you need support or information about quitting drugs.  Tell your health care provider if you often feel depressed.  Tell your health care provider if you have ever been abused or do not feel safe at home.   This information is not intended to replace advice given to you by your health care provider. Make sure you discuss any questions you have with your health care provider.   Document Released: 02/07/2011 Document Revised: 08/15/2014 Document Reviewed: 06/26/2013 Elsevier Interactive Patient Education [employer].

## 2015-11-14 LAB — ANEMIA PROFILE B
BASOS ABS: 0 10*3/uL (ref 0.0–0.2)
Basos: 1 %
EOS (ABSOLUTE): 0.1 10*3/uL (ref 0.0–0.4)
Eos: 1 %
Ferritin: 16 ng/mL (ref 15–150)
Folate: 12 ng/mL (ref >3.0)
HEMATOCRIT: 41.3 % (ref 34.0–46.6)
Hemoglobin: 13.8 g/dL (ref 11.1–15.9)
IMMATURE GRANS (ABS): 0 10*3/uL (ref 0.0–0.1)
IMMATURE GRANULOCYTES: 0 %
Iron Saturation: 20 % (ref 15–55)
Iron: 84 ug/dL (ref 27–159)
LYMPHS: 26 %
Lymphocytes Absolute: 1.1 10*3/uL (ref 0.7–3.1)
MCH: 28.4 pg (ref 26.6–33.0)
MCHC: 33.4 g/dL (ref 31.5–35.7)
MCV: 85 fL (ref 79–97)
MONOCYTES: 9 %
Monocytes Absolute: 0.4 10*3/uL (ref 0.1–0.9)
NEUTROS PCT: 63 %
Neutrophils Absolute: 2.7 10*3/uL (ref 1.4–7.0)
Platelets: 246 10*3/uL (ref 150–379)
RBC: 4.86 x10E6/uL (ref 3.77–5.28)
RDW: 15.6 % — AB (ref 12.3–15.4)
RETIC CT PCT: 1.2 % (ref 0.6–2.6)
Total Iron Binding Capacity: 414 ug/dL (ref 250–450)
UIBC: 330 ug/dL (ref 131–425)
Vitamin B-12: 398 pg/mL (ref 211–946)
WBC: 4.3 10*3/uL (ref 3.4–10.8)

## 2015-11-14 LAB — BMP8+EGFR
BUN / CREAT RATIO: 26 — AB (ref 9–23)
BUN: 15 mg/dL (ref 6–24)
CHLORIDE: 102 mmol/L (ref 96–106)
CO2: 22 mmol/L (ref 18–29)
Calcium: 9.5 mg/dL (ref 8.7–10.2)
Creatinine, Ser: 0.57 mg/dL (ref 0.57–1.00)
GFR calc Af Amer: 122 mL/min/{1.73_m2} (ref >59)
GFR, EST NON AFRICAN AMERICAN: 105 mL/min/{1.73_m2} (ref >59)
Glucose: 84 mg/dL (ref 65–99)
POTASSIUM: 4.5 mmol/L (ref 3.5–5.2)
Sodium: 145 mmol/L — ABNORMAL HIGH (ref 134–144)

## 2015-11-14 LAB — HEPATITIS C ANTIBODY

## 2015-11-14 LAB — THYROID PANEL WITH TSH
FREE THYROXINE INDEX: 2.4 (ref 1.2–4.9)
T3 Uptake Ratio: 30 % (ref 24–39)
T4, Total: 8 ug/dL (ref 4.5–12.0)
TSH: 1.73 u[IU]/mL (ref 0.450–4.500)

## 2015-11-14 LAB — HEPATIC FUNCTION PANEL
ALBUMIN: 4.8 g/dL (ref 3.5–5.5)
ALK PHOS: 58 IU/L (ref 39–117)
ALT: 11 IU/L (ref 0–32)
AST: 21 IU/L (ref 0–40)
BILIRUBIN, DIRECT: 0.12 mg/dL (ref 0.00–0.40)
Bilirubin Total: 0.4 mg/dL (ref 0.0–1.2)
TOTAL PROTEIN: 7.7 g/dL (ref 6.0–8.5)

## 2015-11-14 LAB — HIV ANTIBODY (ROUTINE TESTING W REFLEX): HIV SCREEN 4TH GENERATION: NONREACTIVE

## 2015-11-16 ENCOUNTER — Telehealth: Payer: Self-pay | Admitting: Nurse Practitioner

## 2015-11-17 ENCOUNTER — Encounter (HOSPITAL_COMMUNITY): Payer: Self-pay

## 2015-11-17 ENCOUNTER — Emergency Department (HOSPITAL_COMMUNITY): Payer: BLUE CROSS/BLUE SHIELD

## 2015-11-17 ENCOUNTER — Emergency Department (HOSPITAL_COMMUNITY)
Admission: EM | Admit: 2015-11-17 | Discharge: 2015-11-18 | Disposition: A | Discharge status: Home / Self Care | Payer: BLUE CROSS/BLUE SHIELD | Attending: Emergency Medicine | Admitting: Emergency Medicine

## 2015-11-17 DIAGNOSIS — K219 Gastro-esophageal reflux disease without esophagitis: Secondary | ICD-10-CM | POA: Diagnosis not present

## 2015-11-17 DIAGNOSIS — S82831A Other fracture of upper and lower end of right fibula, initial encounter for closed fracture: Secondary | ICD-10-CM | POA: Diagnosis not present

## 2015-11-17 DIAGNOSIS — F329 Major depressive disorder, single episode, unspecified: Secondary | ICD-10-CM | POA: Diagnosis not present

## 2015-11-17 DIAGNOSIS — W1839XA Other fall on same level, initial encounter: Secondary | ICD-10-CM | POA: Insufficient documentation

## 2015-11-17 DIAGNOSIS — S8251XA Displaced fracture of medial malleolus of right tibia, initial encounter for closed fracture: Secondary | ICD-10-CM | POA: Diagnosis not present

## 2015-11-17 DIAGNOSIS — I10 Essential (primary) hypertension: Secondary | ICD-10-CM | POA: Insufficient documentation

## 2015-11-17 DIAGNOSIS — Y9301 Activity, walking, marching and hiking: Secondary | ICD-10-CM | POA: Insufficient documentation

## 2015-11-17 DIAGNOSIS — S9304XA Dislocation of right ankle joint, initial encounter: Secondary | ICD-10-CM | POA: Insufficient documentation

## 2015-11-17 DIAGNOSIS — T148 Other injury of unspecified body region: Secondary | ICD-10-CM | POA: Diagnosis not present

## 2015-11-17 DIAGNOSIS — M25571 Pain in right ankle and joints of right foot: Secondary | ICD-10-CM | POA: Diagnosis not present

## 2015-11-17 DIAGNOSIS — Z79899 Other long term (current) drug therapy: Secondary | ICD-10-CM | POA: Diagnosis not present

## 2015-11-17 DIAGNOSIS — S82891A Other fracture of right lower leg, initial encounter for closed fracture: Secondary | ICD-10-CM

## 2015-11-17 DIAGNOSIS — S99911A Unspecified injury of right ankle, initial encounter: Secondary | ICD-10-CM | POA: Diagnosis present

## 2015-11-17 DIAGNOSIS — Y9289 Other specified places as the place of occurrence of the external cause: Secondary | ICD-10-CM | POA: Diagnosis not present

## 2015-11-17 DIAGNOSIS — Y998 Other external cause status: Secondary | ICD-10-CM | POA: Insufficient documentation

## 2015-11-17 MED ORDER — OXYCODONE-ACETAMINOPHEN 5-325 MG PO TABS
1.0000 | ORAL_TABLET | Freq: Once | ORAL | Status: AC
  Administered 2015-11-17: 1 via ORAL
  Filled 2015-11-17: qty 1

## 2015-11-17 MED ORDER — SODIUM CHLORIDE 0.9 % IV BOLUS (SEPSIS)
1000.0000 mL | Freq: Once | INTRAVENOUS | Status: DC
Start: 2015-11-17 — End: 2015-11-18

## 2015-11-17 MED ORDER — SODIUM CHLORIDE 0.9 % IV SOLN
INTRAVENOUS | Status: AC | PRN
  Administered 2015-11-17: 1000 mL via INTRAVENOUS

## 2015-11-17 MED ORDER — PROPOFOL 10 MG/ML IV BOLUS
1.0000 mg/kg | Freq: Once | INTRAVENOUS | Status: DC
  Filled 2015-11-17: qty 20

## 2015-11-17 MED ORDER — PROPOFOL 10 MG/ML IV BOLUS
INTRAVENOUS | Status: AC | PRN
  Administered 2015-11-17: 56.2 mg via INTRAVENOUS

## 2015-11-17 MED ORDER — HYDROMORPHONE HCL 1 MG/ML IJ SOLN: 1.0000 mg | Freq: Once | INTRAMUSCULAR | Status: DC

## 2015-11-17 MED ORDER — OXYCODONE-ACETAMINOPHEN 5-325 MG PO TABS: 1.0000 | ORAL_TABLET | Freq: Four times a day (QID) | ORAL | Status: DC | PRN

## 2015-11-17 MED ORDER — FENTANYL CITRATE (PF) 100 MCG/2ML IJ SOLN: 50.0000 ug | Freq: Once | INTRAMUSCULAR | Status: DC

## 2015-11-17 NOTE — Discharge Instructions (Signed)
You have a ankle fracture, and he will need to see Dr. Percell Miller in his office at 8:30 AM tomorrow. Please use crutches to walk and do not bear weight on your right leg. Return for worsening symptoms including new numbness or weakness, skin changes to your toes, worsening pain, or any other symptoms concerning to you.  Cast or Splint Care Casts and splints support injured limbs and keep bones from moving while they heal. It is important to care for your cast or splint at home.  HOME CARE INSTRUCTIONS  Keep the cast or splint uncovered during the drying period. It can take 24 to 48 hours to dry if it is made of plaster. A fiberglass cast will dry in less than 1 hour.  Do not rest the cast on anything harder than a pillow for the first 24 hours.  Do not put weight on your injured limb or apply pressure to the cast until your health care provider gives you permission.  Keep the cast or splint dry. Wet casts or splints can lose their shape and may not support the limb as well. A wet cast that has lost its shape can also create harmful pressure on your skin when it dries. Also, wet skin can become infected.  Cover the cast or splint with a plastic bag when bathing or when out in the rain or snow. If the cast is on the trunk of the body, take sponge baths until the cast is removed.  If your cast does become wet, dry it with a towel or a blow dryer on the cool setting only.  Keep your cast or splint clean. Soiled casts may be wiped with a moistened cloth.  Do not place any hard or soft foreign objects under your cast or splint, such as cotton, toilet paper, lotion, or powder.  Do not try to scratch the skin under the cast with any object. The object could get stuck inside the cast. Also, scratching could lead to an infection. If itching is a problem, use a blow dryer on a cool setting to relieve discomfort.  Do not trim or cut your cast or remove padding from inside of it.  Exercise all joints next  to the injury that are not immobilized by the cast or splint. For example, if you have a long leg cast, exercise the hip joint and toes. If you have an arm cast or splint, exercise the shoulder, elbow, thumb, and fingers.  Elevate your injured arm or leg on 1 or 2 pillows for the first 1 to 3 days to decrease swelling and pain.It is best if you can comfortably elevate your cast so it is higher than your heart. SEEK MEDICAL CARE IF:   Your cast or splint cracks.  Your cast or splint is too tight or too loose.  You have unbearable itching inside the cast.  Your cast becomes wet or develops a soft spot or area.  You have a bad smell coming from inside your cast.  You get an object stuck under your cast.  Your skin around the cast becomes red or raw.  You have new pain or worsening pain after the cast has been applied. SEEK IMMEDIATE MEDICAL CARE IF:   You have fluid leaking through the cast.  You are unable to move your fingers or toes.  You have discolored (blue or white), cool, painful, or very swollen fingers or toes beyond the cast.  You have tingling or numbness around the injured area.  You have severe pain or pressure under the cast.  You have any difficulty with your breathing or have shortness of breath.  You have chest pain.   This information is not intended to replace advice given to you by your health care provider. Make sure you discuss any questions you have with your health care provider.   Document Released: 07/22/2000 Document Revised: 05/15/2013 Document Reviewed: 01/31/2013 Elsevier Interactive Patient Education [employer].

## 2015-11-17 NOTE — ED Notes (Signed)
Pt arrived via [facility] from home c/o pt fell in yard today with obvious right ankle deformity and tenting.  Denies LOC, ETOH.  EMS gave 8mg  morphine, 576ml or NS.  Pt takes norco daily for chronic back pain.  + Cap refill, + can move extremity.

## 2015-11-17 NOTE — Progress Notes (Signed)
Orthopedic Tech Progress Note Patient Details:  [name] [facility] 05/13/1961 [id number]  Ortho Devices Type of Ortho Device: Ace wrap, Post (short leg) splint, Stirrup splint Ortho Device/Splint Location: RLE Ortho Device/Splint Interventions: Ordered, Application   Braulio Bosch 11/17/2015, 8:02 PM

## 2015-11-17 NOTE — ED Notes (Signed)
Gloriann Loan, PA and Christy Sartorius, Respiratory therapist at the bedside.

## 2015-11-17 NOTE — Progress Notes (Signed)
ED CM met with patient at bedside to discuss discharge plan. Patient presented s/p a fall with ankle twisting while walking her son's dog who she was dog sitting. Patient reports she lives at home with disabled husband, Discussed discharge plans, referral to [facility] ortho for evaluation, before starting any PT,  Patient was given an appointment for tomorrow 4/12 at 8:30am, teach back done she verbalized understanding. Patient informs CM that she does not have a ride back to [place]. She states her husband is disable and does not drive at night. He will be able to pick her up in the am. Patient states, there is no one who could pick up tonight to get her home.Patient has [employer], and  Patient insurance will not pay for an ambulance ride if patient is ambulatory. CM discussed this with charge nurse [facility]. Patient may be able stay in the Pod C holding until husband arrives at 7:30 am per wife.  CM will hand off to daytime CM to follow up with with discharge from the ED in the am.

## 2015-11-17 NOTE — ED Provider Notes (Signed)
CSN: [id number]     Arrival date & time 11/17/15  1726 History   First MD Initiated Contact with Patient 11/17/15 1728     Chief Complaint  Patient presents with  . Fall  . Ankle Pain     (Consider location/radiation/quality/duration/timing/severity/associated sxs/prior Treatment) HPI   55 year old female who presents with right ankle pain. History of hypertension and GERD. Does not take any blood thinners. Walking her friend's dog today when the dog pulled on the leash and she had fallen. Felt her right foot twist. She fell to the ground but did not hit her head or have loss of consciousness. Noted deformity and severe pain to the right ankle. Denies any other injuries. No numbness or weakness. No abrasions or skin breaks. Denies chest pain, difficulty breathing, abdominal pain, neck pain or back pain.  Past Medical History  Diagnosis Date  . HTN (hypertension)   . GERD (gastroesophageal reflux disease)   . Depression   . Chest pain   . Seizures [facility])    Past Surgical History  Procedure Laterality Date  . Tubal ligation    . Foot surgery Right 9/10    right foot-bunion-hammertoe  . Tubes in ears    . Metatarsal osteotomy with bunionectomy Left 08/02/12    McBride  . Lapidus fusion Left 08/02/12   Family History  Problem Relation Age of Onset  . Hypertension Mother   . Cancer Mother     kidney  . Diabetes Father   . Hypertension Father    Social History  Substance Use Topics  . Smoking status: Never Smoker   . Smokeless tobacco: Never Used  . Alcohol Use: 0.0 oz/week    0 Standard drinks or equivalent per week     Comment: on ocassion   OB History    No data available     Review of Systems 10/14 systems reviewed and are negative other than those stated in the HPI    Allergies  Amlodipine and Cortisporin  Home Medications   Prior to Admission medications   Medication Sig Start Date End Date Taking? Authorizing Provider  clonazePAM (KLONOPIN) 0.5 MG  tablet Take 0.5 mg by mouth. Take one to two tablets three times a day as needed.  Patient reported.   Dr Toy Care, psychiatry, prescriber   Yes Historical Provider, MD  escitalopram (LEXAPRO) 20 MG tablet Take 20 mg by mouth daily.   Yes Historical Provider, MD  esomeprazole (NEXIUM) 20 MG capsule Take 1 capsule (20 mg total) by mouth daily at 12 noon. 11/13/15  Yes Sharion Balloon, FNP  HYDROcodone-acetaminophen (NORCO) 10-325 MG tablet Take 1 tablet by mouth every 4 (four) hours as needed for moderate pain.    Yes Historical Provider, MD  methocarbamol (ROBAXIN) 750 MG tablet Take 750 mg by mouth.   Yes Historical Provider, MD  mirtazapine (REMERON) 30 MG tablet Take 30 mg by mouth at bedtime.   Yes Historical Provider, MD  Multiple Vitamin (MULTI VITAMIN PO) Take 1 tablet by mouth daily.    Yes Historical Provider, MD  oxyCODONE-acetaminophen (ROXICET) 5-325 MG tablet Take 1 tablet by mouth every 6 (six) hours as needed for moderate pain or severe pain. 11/17/15   Forde Dandy, MD   BP 109/78 mmHg  Pulse 96  Resp 16  SpO2 97%  LMP 08/09/2013 Physical Exam Physical Exam  Nursing note and vitals reviewed. Constitutional: Well developed, well nourished, non-toxic, and in no acute distress Head: Normocephalic and atraumatic.  Mouth/Throat: Oropharynx  is clear and moist.  Neck: Normal range of motion. Neck supple.  Cardiovascular: Normal rate and regular rhythm.  +2 DP pulses Pulmonary/Chest: Effort normal and breath sounds normal.  Abdominal: Soft. There is no tenderness. There is no rebound and no guarding.  Musculoskeletal: Normal range of motion.  Neurological: Alert, no facial droop, fluent speech,  Full strength in large toe dorsi/plantar flexion Sensation in tact in over foot Skin: Skin is warm and dry.  Psychiatric: Cooperative   ED Course  Reduction of dislocation Date/Time: 11/17/2015 11:47 PM Performed by: Brantley Stage DUO Authorized by: Brantley Stage DUO Consent: Verbal consent  obtained. Risks and benefits: risks, benefits and alternatives were discussed Consent given by: patient Patient identity confirmed: verbally with patient Time out: Immediately prior to procedure a "time out" was called to verify the correct patient, procedure, equipment, support staff and site/side marked as required. Preparation: Patient was prepped and draped in the usual sterile fashion. Local anesthesia used: no Patient sedated: yes Sedation type: moderate (conscious) sedation Sedatives: see MAR for details Analgesia: see MAR for details Vitals: Vital signs were monitored during sedation. Patient tolerance: Patient tolerated the procedure well with no immediate complications   (including critical care time)  Procedural sedation Performed by: Forde Dandy Consent: Verbal consent obtained. Risks and benefits: risks, benefits and alternatives were discussed Required items: required blood products, implants, devices, and special equipment available Patient identity confirmed: arm band and provided demographic data Time out: Immediately prior to procedure a "time out" was called to verify the correct patient, procedure, equipment, support staff and site/side marked as required.  Sedation type: moderate (conscious) sedation NPO time confirmed and considedered  Sedatives: PROPOFOL  Physician Time at Bedside: 20:41  Vitals: Vital signs were monitored during sedation. Cardiac Monitor, pulse oximeter Patient tolerance: Patient tolerated the procedure well with no immediate complications. Comments: Pt with uneventful recovered. Returned to pre-procedural sedation baseline    Labs Review Labs Reviewed - No data to display  Imaging Review Dg Ankle Complete Right  11/17/2015  CLINICAL DATA:  Right ankle fracture dislocation EXAM: RIGHT ANKLE - COMPLETE 3+ VIEW COMPARISON:  11/17/2015 at 18:14 FINDINGS: Three views obtained through fiberglass demonstrate successful reduction of the  fracture dislocation. There is 1 cortex width posterolateral displacement of the fibular fracture. There is 1 mm posterior displacement of the posterior malleolar fracture. There is 2-3 mm distraction at the transverse medial malleolar fracture. IMPRESSION: Successful reduction of the dislocation. Substantial improvement in alignment and position of the trimalleolar fractures. Electronically Signed   By: Andreas Newport M.D.   On: 11/17/2015 21:38   Dg Ankle Complete Right  11/17/2015  CLINICAL DATA:  Initial encounter for Fall today. Pt c/o all over right ankle pain. No hx of right ankle injuries but pt does have hx of right foot surgery x3 years ago. EXAM: RIGHT ANKLE - COMPLETE 3+ VIEW COMPARISON:  None. FINDINGS: Bimalleolar soft tissue swelling. Fracture dislocation of the ankle, with the talus positioned lateral and posterior to the distal tibia. Transverse fracture involving the medial malleolus. Oblique fracture involving the distal fibula. There is also a probable oblique fracture involving the distal posterior tibia. Base of fifth metatarsal intact. Incompletely imaged metatarsal fixation. IMPRESSION: Fracture dislocation involving the ankle, as above. Overlying soft tissue swelling. Electronically Signed   By: Abigail Miyamoto M.D.   On: 11/17/2015 19:00   I have personally reviewed and evaluated these images and lab results as part of my medical decision-making.   EKG  Interpretation None        MDM   Final diagnoses:  Fracture dislocation of right ankle joint, closed, initial encounter    55 year old female who presents with right ankle pain after mechanical fall. On presentation is nontoxic in no acute distress. With obvious right ankle deformity, but somebody is neurovascularly intact distally. No evidence of pain or deformity proximal to the ankle. X-ray reveals evidence of a fracture dislocation of the right ankle. I discussed this patient with Dr. Percell Miller who recommended reduction  with splinting at bedside. He will see the patient tomorrow morning at 8:30 AM in clinic to discuss surgical management.  Patient consciously sedated with propofol and fracture was reduced at bedside. Posterior leg splint which stirrups were applied by orthopedic tech at bedside. X-ray reveals adequate reduction of fracture. This is discussed with Dr. Percell Miller.   Discharged but no ride home. Case management evaluated patient. Does not qualify for PTAR. Will hold in ED until husband can pick up patient at 7AM in the morning.   Forde Dandy, MD 11/17/15 2351

## 2015-11-17 NOTE — ED Notes (Signed)
Pt to wait for ride. Pt verbalized understanding of d/c instructions, prescriptions, and follow-up care.

## 2015-11-18 DIAGNOSIS — M25571 Pain in right ankle and joints of right foot: Secondary | ICD-10-CM | POA: Diagnosis not present

## 2015-11-18 NOTE — ED Notes (Signed)
Pt given breakfast meal, waiting for her husband to arrive and he should be here by 8 am. Pt is a x 4.

## 2015-11-26 DIAGNOSIS — S82841A Displaced bimalleolar fracture of right lower leg, initial encounter for closed fracture: Secondary | ICD-10-CM | POA: Diagnosis not present

## 2015-11-26 DIAGNOSIS — Y999 Unspecified external cause status: Secondary | ICD-10-CM | POA: Diagnosis not present

## 2015-11-26 DIAGNOSIS — Y939 Activity, unspecified: Secondary | ICD-10-CM | POA: Diagnosis not present

## 2015-11-26 DIAGNOSIS — Y929 Unspecified place or not applicable: Secondary | ICD-10-CM | POA: Diagnosis not present

## 2015-11-26 DIAGNOSIS — S82851A Displaced trimalleolar fracture of right lower leg, initial encounter for closed fracture: Secondary | ICD-10-CM | POA: Diagnosis not present

## 2015-11-26 DIAGNOSIS — X58XXXA Exposure to other specified factors, initial encounter: Secondary | ICD-10-CM | POA: Diagnosis not present

## 2015-11-26 DIAGNOSIS — G8918 Other acute postprocedural pain: Secondary | ICD-10-CM | POA: Diagnosis not present

## 2015-12-02 DIAGNOSIS — S82851D Displaced trimalleolar fracture of right lower leg, subsequent encounter for closed fracture with routine healing: Secondary | ICD-10-CM | POA: Diagnosis not present

## 2015-12-07 DIAGNOSIS — S82851D Displaced trimalleolar fracture of right lower leg, subsequent encounter for closed fracture with routine healing: Secondary | ICD-10-CM | POA: Diagnosis not present

## 2016-01-05 ENCOUNTER — Encounter: Payer: BLUE CROSS/BLUE SHIELD | Admitting: *Deleted

## 2016-01-06 DIAGNOSIS — S82851D Displaced trimalleolar fracture of right lower leg, subsequent encounter for closed fracture with routine healing: Secondary | ICD-10-CM | POA: Diagnosis not present

## 2016-01-12 ENCOUNTER — Ambulatory Visit: Payer: BLUE CROSS/BLUE SHIELD | Attending: Orthopedic Surgery | Admitting: Physical Therapy

## 2016-01-12 DIAGNOSIS — M25571 Pain in right ankle and joints of right foot: Secondary | ICD-10-CM | POA: Insufficient documentation

## 2016-01-12 DIAGNOSIS — M25671 Stiffness of right ankle, not elsewhere classified: Secondary | ICD-10-CM | POA: Diagnosis not present

## 2016-01-12 NOTE — Therapy (Signed)
[facility] [place], [place], [address] Phone: [phone]   Fax:  [phone]  Physical Therapy Evaluation  Patient Details  Name: [name] MRN: [id number] Date of Birth: 24-Feb-1961 Referring Provider: Ernesta Amble. Percell Miller MD  Encounter Date: 01/12/2016      PT End of Session - 01/12/16 1214    Visit Number 1   Number of Visits 16   Date for PT Re-Evaluation 03/08/16   PT Start Time 1030   PT Stop Time 1113   PT Time Calculation (min) 43 min   Activity Tolerance Patient tolerated treatment well   Behavior During Therapy [facility] for tasks assessed/performed      Past Medical History  Diagnosis Date  . HTN (hypertension)   . GERD (gastroesophageal reflux disease)   . Depression   . Chest pain   . Seizures [facility])     Past Surgical History  Procedure Laterality Date  . Tubal ligation    . Foot surgery Right 9/10    right foot-bunion-hammertoe  . Tubes in ears    . Metatarsal osteotomy with bunionectomy Left 08/02/12    McBride  . Lapidus fusion Left 08/02/12    There were no vitals filed for this visit.       Subjective Assessment - 01/12/16 1040    Subjective My son's Dondra Spry caused me to fall on 11/29/15.  I go back to the Dr. on on 02/03/16 and will get out of this boot.   Limitations Walking   How long can you walk comfortably? I walk short distances without my boot.   Patient Stated Goals Get back to work.   Currently in Pain? Yes   Pain Score 2    Pain Location Ankle   Pain Orientation Right   Pain Descriptors / Indicators Aching   Pain Onset More than a month ago   Pain Frequency Constant   Aggravating Factors  When i go without my boot around the house and turn my foot certain ways.   Pain Relieving Factors Rest and elevation.            [facility] PT Assessment - 01/12/16 0001    Assessment   Medical Diagnosis Right ankle ORIF.   Referring Provider Ernesta Amble. Percell Miller MD   Onset  Date/Surgical Date --  April 2017.   Hand Dominance Right   Next MD Visit --  02/03/16.   Prior Therapy --  Low back.   Precautions   Precaution Comments In right CAM bot/walker WBAT.   Restrictions   Other Position/Activity Restrictions WBAT in CAM boot/walker.   Balance Screen   Has the patient fallen in the past 6 months Yes   How many times? --  1   Has the patient had a decrease in activity level because of a fear of falling?  No   Is the patient reluctant to leave their home because of a fear of falling?  No   Home Environment   Living Environment Private residence   Additional Comments 2 steps and stairs to upstairs with a railing.   Prior Function   Level of Independence Independent   Observation/Other Assessments   Observations Right lateral surigcal incision looks good but patient has an abrasion due to scratching and has theis covered with antibiotic oinment and a bandaid.   ROM / Strength   AROM / PROM / Strength AROM;Strength   AROM   Overall AROM Comments Active right ankle dorsiflexion with knee extended  and flexed= -7 degrees from the neutral position and plantarflexion= 35 degrees.   Strength   Overall Strength Comments Right ankle strength grossly assessed at 3+ to 4-/5.   Palpation   Palpation comment Palpable tenderness over right lateral incisional site region of patient's distal right ankle.   Ambulation/Gait   Gait Comments Independent ambulation and wbat with a right CAM boot/walker                   [facility] Adult PT Treatment/Exercise - 01/12/16 0001    Manual Therapy   Manual therapy comments PROM into right ankle dorsiflexion with knee extended and flexed x 12 minutes.                     PT Long Term Goals - 01/12/16 1250    PT LONG TERM GOAL #1   Title Ind with an advanced HEP.   Time 8   Period Weeks   Status New   PT LONG TERM GOAL #2   Title Increase right ankle dorsiflexion to  8 degrees to normalize the patient's  gait pattern.   Time 8   Period Weeks   Status New   PT LONG TERM GOAL #3   Title Increase right ankle strength to 4+ to 5/5 to increase stability for functional tasks.   Time 8   Period Weeks   Status New   PT LONG TERM GOAL #4   Title Perform ADL's with pain not > 3/10.   Time 8   Period Weeks   Status New               Plan - 01/12/16 1236    Clinical Impression Statement The patient underwent a right ankle ORIF after a fall walking a dog on 11/29/15.  The patient is still in a CAM walker/boot at this time.  Her pain-level is a low 2/10 today.  She has a lack of range of motion especially into right ankle dorsiflexion.  She expects to get out of the boot by the end of the month.  She is wbat in the boot.   Rehab Potential Excellent   PT Frequency 2x / week   PT Duration 8 weeks   PT Treatment/Interventions ADLs/Self Care Home Management;Electrical Stimulation;Cryotherapy;Moist Heat;Therapeutic exercise;Therapeutic activities;Gait training;Neuromuscular re-education;Patient/family education;Manual techniques;Passive range of motion   PT Next Visit Plan PROM to right ankle; seated Rockerboard and dynadisc and theraband resisted ankle exercise.  Vasopneumatic and e'stim PRN.   Consulted and Agree with Plan of Care Patient      Patient will benefit from skilled therapeutic intervention in order to improve the following deficits and impairments:  Pain, Decreased activity tolerance, Decreased range of motion, Decreased strength  Visit Diagnosis: Pain in right ankle and joints of right foot - Plan: PT plan of care cert/re-cert  Stiffness of right ankle, not elsewhere classified - Plan: PT plan of care cert/re-cert     Problem List Patient Active Problem List   Diagnosis Date Noted  . Hammer toe of right foot 07/08/2015  . Bunion 07/08/2015  . Major depressive disorder, single episode, severe without psychotic features ([place])   . Major depressive disorder, single episode,  severe ([place]) 11/14/2014  . Generalized anxiety disorder 11/14/2014  . Alcohol use disorder, severe, dependence ([place]) 11/14/2014    Class: Chronic  . Other hammer toe (acquired) 05/06/2014  . Pronation deformity of ankle, acquired 05/06/2014  . Chronic cough 12/01/2013  . Essential hypertension, benign 11/13/2012  .  Depression 11/13/2012  . GAD (generalized anxiety disorder) 11/13/2012  . GERD (gastroesophageal reflux disease) 11/13/2012  . Insomnia 11/13/2012  . Post-operative state 10/30/2012    Emrey Thornley, [name] 01/12/2016, 12:59 PM  [facility] [address] [place], [place], [address] Phone: [phone]   Fax:  [phone]  Name: [name] MRN: [id number] Date of Birth: 1960-11-02

## 2016-01-14 ENCOUNTER — Encounter: Payer: Self-pay | Admitting: Physical Therapy

## 2016-01-14 ENCOUNTER — Ambulatory Visit: Payer: BLUE CROSS/BLUE SHIELD | Admitting: Physical Therapy

## 2016-01-14 DIAGNOSIS — M25671 Stiffness of right ankle, not elsewhere classified: Secondary | ICD-10-CM | POA: Diagnosis not present

## 2016-01-14 DIAGNOSIS — M25571 Pain in right ankle and joints of right foot: Secondary | ICD-10-CM

## 2016-01-14 NOTE — Patient Instructions (Signed)
ROM: Plantar / Dorsiflexion    With left leg relaxed, gently flex and extend ankle. Move through full range of motion. Avoid pain. Repeat __10__ times per set. Do __1__ sets per session. Do __3__ sessions per day.  http://orth.exer.us/34   Copyright  VHI. All rights reserved.  Toe Raise (Sitting)    Raise toes, keeping heels on floor. Repeat _10___ times per set. Do __1_ sets per session. Do __2__ sessions per day.  http://orth.exer.us/46   Copyright  VHI. All rights reserved.

## 2016-01-14 NOTE — Therapy (Signed)
[facility] [place], [place], [address] Phone: [phone]   Fax:  [phone]  Physical Therapy Treatment  Patient Details  Name: FRANCHESKA JEFFUS MRN: [id number] Date of Birth: 09/19/1960 Referring Provider: Ernesta Amble. Percell Miller MD  Encounter Date: 01/14/2016      PT End of Session - 01/14/16 1112    Visit Number 2   Number of Visits 16   Date for PT Re-Evaluation 03/08/16   PT Start Time 1031   PT Stop Time 1116   PT Time Calculation (min) 45 min   Activity Tolerance Patient tolerated treatment well   Behavior During Therapy [facility] for tasks assessed/performed      Past Medical History  Diagnosis Date  . HTN (hypertension)   . GERD (gastroesophageal reflux disease)   . Depression   . Chest pain   . Seizures [facility])     Past Surgical History  Procedure Laterality Date  . Tubal ligation    . Foot surgery Right 9/10    right foot-bunion-hammertoe  . Tubes in ears    . Metatarsal osteotomy with bunionectomy Left 08/02/12    McBride  . Lapidus fusion Left 08/02/12    There were no vitals filed for this visit.      Subjective Assessment - 01/14/16 1046    Subjective Patient reported walking some at home without boot   Limitations Walking   How long can you walk comfortably? I walk short distances without my boot.   Patient Stated Goals Get back to work.   Currently in Pain? Yes   Pain Score 1    Pain Location Ankle   Pain Orientation Right   Pain Descriptors / Indicators Aching   Pain Onset More than a month ago   Pain Frequency Constant   Aggravating Factors  when out of boot   Pain Relieving Factors at rest                         [facility] Adult PT Treatment/Exercise - 01/14/16 0001    Exercises   Exercises Ankle   Modalities   Modalities Vasopneumatic   Vasopneumatic   Number Minutes Vasopneumatic  15 minutes   Vasopnuematic Location  Ankle   Vasopneumatic Pressure Low   Manual Therapy    Manual Therapy Passive ROM   Passive ROM PROM for right ankle patint supine   Ankle Exercises: Seated   Other Seated Ankle Exercises seated rockerboard 107min   Other Seated Ankle Exercises seated dyna dic x4 min                PT Education - 01/14/16 1111    Education provided Yes   Education Details HEP   Person(s) Educated Patient   Methods Explanation;Demonstration;Handout   Comprehension Verbalized understanding;Returned demonstration             PT Long Term Goals - 01/12/16 1250    PT LONG TERM GOAL #1   Title Ind with an advanced HEP.   Time 8   Period Weeks   Status New   PT LONG TERM GOAL #2   Title Increase right ankle dorsiflexion to  8 degrees to normalize the patient's gait pattern.   Time 8   Period Weeks   Status New   PT LONG TERM GOAL #3   Title Increase right ankle strength to 4+ to 5/5 to increase stability for functional tasks.   Time 8   Period Weeks  Status New   PT LONG TERM GOAL #4   Title Perform ADL's with pain not > 3/10.   Time 8   Period Weeks   Status New               Plan - 01/14/16 1114    Clinical Impression Statement Patient progressing with little pain in ankle and tolerated treatment well today. patient has reported walking around home with no boot. Patient was given HEP for gentle ROM in right ankle. Patient requested short appt today due to personal reasons.  Patient goals ongoing at this time due to ROM, strength and pain deficits.   Rehab Potential Excellent   PT Frequency 2x / week   PT Duration 8 weeks   PT Treatment/Interventions ADLs/Self Care Home Management;Electrical Stimulation;Cryotherapy;Moist Heat;Therapeutic exercise;Therapeutic activities;Gait training;Neuromuscular re-education;Patient/family education;Manual techniques;Passive range of motion   PT Next Visit Plan PROM to right ankle; seated Rockerboard and dynadisc and theraband resisted ankle exercise.  Vasopneumatic and e'stim PRN.    Consulted and Agree with Plan of Care Patient      Patient will benefit from skilled therapeutic intervention in order to improve the following deficits and impairments:  Pain, Decreased activity tolerance, Decreased range of motion, Decreased strength  Visit Diagnosis: Pain in right ankle and joints of right foot  Stiffness of right ankle, not elsewhere classified     Problem List Patient Active Problem List   Diagnosis Date Noted  . Hammer toe of right foot 07/08/2015  . Bunion 07/08/2015  . Major depressive disorder, single episode, severe without psychotic features ([place])   . Major depressive disorder, single episode, severe ([place]) 11/14/2014  . Generalized anxiety disorder 11/14/2014  . Alcohol use disorder, severe, dependence ([place]) 11/14/2014    Class: Chronic  . Other hammer toe (acquired) 05/06/2014  . Pronation deformity of ankle, acquired 05/06/2014  . Chronic cough 12/01/2013  . Essential hypertension, benign 11/13/2012  . Depression 11/13/2012  . GAD (generalized anxiety disorder) 11/13/2012  . GERD (gastroesophageal reflux disease) 11/13/2012  . Insomnia 11/13/2012  . Post-operative state 10/30/2012    Phillips Climes, PTA 01/14/2016, 11:24 AM  [facility] [place], [place], [address] Phone: [phone]   Fax:  [phone]  Name: JANNETTA WOULARD MRN: [id number] Date of Birth: 04-24-1961

## 2016-01-19 ENCOUNTER — Ambulatory Visit: Payer: BLUE CROSS/BLUE SHIELD | Admitting: Physical Therapy

## 2016-01-19 ENCOUNTER — Encounter: Payer: Self-pay | Admitting: Physical Therapy

## 2016-01-19 DIAGNOSIS — M25571 Pain in right ankle and joints of right foot: Secondary | ICD-10-CM | POA: Diagnosis not present

## 2016-01-19 DIAGNOSIS — M25671 Stiffness of right ankle, not elsewhere classified: Secondary | ICD-10-CM

## 2016-01-19 NOTE — Patient Instructions (Signed)
Ankle Circles    Slowly rotate right foot and ankle clockwise then counterclockwise. Gradually increase range of motion. Avoid pain. Circle __10__ times each direction per set. Do _3___ sets per session. Do __2-3__ sessions per day.  http://orth.exer.us/30   Copyright  VHI. All rights reserved.  Heel Raise (Sitting)    Raise heels, keeping toes on floor. Repeat __10__ times per set. Do _2___ sets per session. Do _2-3___ sessions per day.  http://orth.exer.us/44   Copyright  VHI. All rights reserved.  Toe Raise (Sitting)    Raise toes, keeping heels on floor. Repeat _10___ times per set. Do __2__ sets per session. Do _2-3___ sessions per day.  http://orth.exer.us/46   Copyright  VHI. All rights reserved.  Inversion: Resisted    Cross legs with right leg underneath, foot in tubing loop. Hold tubing around other foot to resist and turn foot in. Repeat __10__ times per set. Do _2___ sets per session. Do _2-3___ sessions per day.  http://orth.exer.us/12   Copyright  VHI. All rights reserved.  Eversion: Resisted    With right foot in tubing loop, hold tubing around other foot to resist and turn foot out. Repeat __10__ times per set. Do __2__ sets per session. Do _2-3___ sessions per day.  http://orth.exer.us/14   Copyright  VHI. All rights reserved.  Dorsiflexion: Resisted    Facing anchor, tubing around left foot, pull toward face.  Repeat _10___ times per set. Do _2___ sets per session. Do _2-3___ sessions per day.  http://orth.exer.us/8   Copyright  VHI. All rights reserved.

## 2016-01-19 NOTE — Therapy (Signed)
[facility] [place], [place], [address] Phone: [phone]   Fax:  [phone]  Physical Therapy Treatment  Patient Details  Name: [name] MRN: [id number] Date of Birth: [birth year] Referring Provider: Ernesta Amble. Percell Miller MD  Encounter Date: 01/19/2016      PT End of Session - 01/19/16 1043    Visit Number 3   Number of Visits 16   Date for PT Re-Evaluation 03/08/16   PT Start Time [id number]   PT Stop Time 1118   PT Time Calculation (min) 36 min   Activity Tolerance Patient tolerated treatment well   Behavior During Therapy [facility] for tasks assessed/performed      Past Medical History  Diagnosis Date  . HTN (hypertension)   . GERD (gastroesophageal reflux disease)   . Depression   . Chest pain   . Seizures [facility])     Past Surgical History  Procedure Laterality Date  . Tubal ligation    . Foot surgery Right 9/10    right foot-bunion-hammertoe  . Tubes in ears    . Metatarsal osteotomy with bunionectomy Left 08/02/12    McBride  . Lapidus fusion Left 08/02/12    There were no vitals filed for this visit.      Subjective Assessment - 01/19/16 1043    Subjective Reports that she has been trying to walk around home without boot. Reports she has been getting ready to move homes at this time. Reports that her husband keeps her busy due to his needs. Requested to be out of PT by 11: 30 am and reports increased thirst.   Limitations Walking   How long can you walk comfortably? I walk short distances without my boot.   Patient Stated Goals Get back to work.   Currently in Pain? No/denies            [facility] PT Assessment - 01/19/16 0001    Assessment   Medical Diagnosis Right ankle ORIF.   Hand Dominance Right   Next MD Visit 02/03/2016   Precautions   Precaution Comments In right CAM bot/walker WBAT.   Restrictions   Other Position/Activity Restrictions WBAT in CAM boot/walker.                      [facility] Adult PT Treatment/Exercise - 01/19/16 0001    Modalities   Modalities Electrical Stimulation;Vasopneumatic   Electrical Stimulation   Electrical Stimulation Location R superior dorsum of foot/ medial ankle   Electrical Stimulation Action Pre-Mod   Electrical Stimulation Parameters 80-150 Hz x13 min   Electrical Stimulation Goals Edema   Vasopneumatic   Number Minutes Vasopneumatic  13 minutes   Vasopnuematic Location  Ankle   Vasopneumatic Pressure Medium   Vasopneumatic Temperature  34   Ankle Exercises: Seated   Ankle Circles/Pumps AROM;Right;20 reps   Heel Raises 20 reps   Toe Raise 20 reps   Other Seated Ankle Exercises Seated rockerboard RLE x5 min   Other Seated Ankle Exercises Seated R foot dynadisc DF/PF, Inv/Ev, circles x5 min   Ankle Exercises: Supine   T-Band R ankle DF/Inv/Ev yellow theraband x15 reps each                PT Education - 01/19/16 1110    Education provided Yes   Education Details HEP- resisted DF, Ev, Inv, ankle circles, seated heel and toe raises   Person(s) Educated Patient   Methods Explanation;Demonstration;Tactile cues;Verbal cues;Handout   Comprehension Verbalized understanding;Returned  demonstration;Verbal cues required;Tactile cues required             PT Long Term Goals - 01/12/16 1250    PT LONG TERM GOAL #1   Title Ind with an advanced HEP.   Time 8   Period Weeks   Status New   PT LONG TERM GOAL #2   Title Increase right ankle dorsiflexion to  8 degrees to normalize the patient's gait pattern.   Time 8   Period Weeks   Status New   PT LONG TERM GOAL #3   Title Increase right ankle strength to 4+ to 5/5 to increase stability for functional tasks.   Time 8   Period Weeks   Status New   PT LONG TERM GOAL #4   Title Perform ADL's with pain not > 3/10.   Time 8   Period Weeks   Status New               Plan - 01/19/16 1111    Clinical Impression Statement Patient tolerated today's treatment well  with no reports of pain or soreness with any of the exercises. Patient requested an HEP for exercises upon late arrival to clinic. Patient required moderate to maximal multimodal cueing for proper exercise technique and also required mirroring technique so as to visually see proper technique. Patient arrived to clinic with CAM boot although she reports not using CAM boot at home at times. Patient's R ankle and forefoot presented with edema and patient was educated that HEP ROM exercises could help with reducing edema. Normal modalities response noted following removal of the modalities. Patient requested for modalities to be stopped 2 minutes early as she had other commitments to go to although patient denied pain.   Rehab Potential Excellent   PT Frequency 2x / week   PT Duration 8 weeks   PT Treatment/Interventions ADLs/Self Care Home Management;Electrical Stimulation;Cryotherapy;Moist Heat;Therapeutic exercise;Therapeutic activities;Gait training;Neuromuscular re-education;Patient/family education;Manual techniques;Passive range of motion   PT Next Visit Plan Continue R ankle ROM and strengthening exercises with modalities for pain and edema PRN per MPT POC.   PT Home Exercise Plan HEP- resisted R ankle DF/Ev/Inv, ankle circles, seated heel and toe raises with yellow theraband   Consulted and Agree with Plan of Care Patient      Patient will benefit from skilled therapeutic intervention in order to improve the following deficits and impairments:  Pain, Decreased activity tolerance, Decreased range of motion, Decreased strength  Visit Diagnosis: Pain in right ankle and joints of right foot  Stiffness of right ankle, not elsewhere classified     Problem List Patient Active Problem List   Diagnosis Date Noted  . Hammer toe of right foot 07/08/2015  . Bunion 07/08/2015  . Major depressive disorder, single episode, severe without psychotic features ([place])   . Major depressive disorder, single  episode, severe ([place]) 11/14/2014  . Generalized anxiety disorder 11/14/2014  . Alcohol use disorder, severe, dependence ([place]) 11/14/2014    Class: Chronic  . Other hammer toe (acquired) 05/06/2014  . Pronation deformity of ankle, acquired 05/06/2014  . Chronic cough 12/01/2013  . Essential hypertension, benign 11/13/2012  . Depression 11/13/2012  . GAD (generalized anxiety disorder) 11/13/2012  . GERD (gastroesophageal reflux disease) 11/13/2012  . Insomnia 11/13/2012  . Post-operative state 10/30/2012    Wynelle Fanny, PTA 01/19/2016, 11:23 AM  [facility] [address] [place], [place], [address] Phone: [phone]   Fax:  [phone]  Name: Carolyn Perezlopez  Carrillo MRN: [id number] Date of Birth: July 18, 1961

## 2016-01-20 ENCOUNTER — Other Ambulatory Visit: Payer: Self-pay | Admitting: *Deleted

## 2016-01-20 ENCOUNTER — Encounter: Payer: Self-pay | Admitting: Physical Therapy

## 2016-01-20 MED ORDER — HYDROXYZINE HCL 50 MG PO TABS: 50.0000 mg | ORAL_TABLET | Freq: Three times a day (TID) | ORAL | Status: DC | PRN

## 2016-01-20 NOTE — Telephone Encounter (Signed)
Patients husband was seeing Dettinger and patient requested something for anxiety. Per Dettinger Vistaril 50mg  TID as needed #90 no refills.

## 2016-01-21 ENCOUNTER — Encounter: Payer: Self-pay | Admitting: Physical Therapy

## 2016-01-22 ENCOUNTER — Ambulatory Visit: Payer: BLUE CROSS/BLUE SHIELD | Admitting: Physical Therapy

## 2016-01-22 ENCOUNTER — Other Ambulatory Visit: Payer: Self-pay | Admitting: Family

## 2016-01-22 DIAGNOSIS — M25571 Pain in right ankle and joints of right foot: Secondary | ICD-10-CM

## 2016-01-22 DIAGNOSIS — M25671 Stiffness of right ankle, not elsewhere classified: Secondary | ICD-10-CM | POA: Diagnosis not present

## 2016-01-22 NOTE — Therapy (Signed)
[facility] [place], [place], [address] Phone: [phone]   Fax:  [phone]  Physical Therapy Treatment  Patient Details  Name: [name] MRN: [id number] Date of Birth: May 08, 1961 Referring Provider: Ernesta Amble. Percell Miller MD  Encounter Date: 01/22/2016      PT End of Session - 01/22/16 1330    Visit Number 4   Number of Visits 16   Date for PT Re-Evaluation 03/08/16   PT Start Time [id number]   PT Stop Time [id number]  Patient arrived late and stated she needed to leave early.   PT Time Calculation (min) 36 min      Past Medical History  Diagnosis Date  . HTN (hypertension)   . GERD (gastroesophageal reflux disease)   . Depression   . Chest pain   . Seizures [facility])     Past Surgical History  Procedure Laterality Date  . Tubal ligation    . Foot surgery Right 9/10    right foot-bunion-hammertoe  . Tubes in ears    . Metatarsal osteotomy with bunionectomy Left 08/02/12    McBride  . Lapidus fusion Left 08/02/12    There were no vitals filed for this visit.      Subjective Assessment - 01/22/16 1331    Subjective Patient has been walking without her boot at homes including short distances without shoes.  Patient tired today due to being up with her husband.  Patient needs to leave early today.   Limitations Walking   How long can you walk comfortably? I walk short distances without my boot.   Patient Stated Goals Get back to work.   Pain Score 2    Pain Location Ankle   Pain Descriptors / Indicators Aching   Pain Onset More than a month ago                         [facility] Adult PT Treatment/Exercise - 01/22/16 0001    Exercises   Exercises Ankle   Manual Therapy   Manual Therapy Passive ROM   Passive ROM PROM while performing STW/M to patient's right ankle x 13 minutes.   Ankle Exercises: Seated   Other Seated Ankle Exercises Seated rockerboard into rigtht ankle plantar and dosrsiflexion and  inversion and eversion x 10 minutes.                     PT Long Term Goals - 01/12/16 1250    PT LONG TERM GOAL #1   Title Ind with an advanced HEP.   Time 8   Period Weeks   Status New   PT LONG TERM GOAL #2   Title Increase right ankle dorsiflexion to  8 degrees to normalize the patient's gait pattern.   Time 8   Period Weeks   Status New   PT LONG TERM GOAL #3   Title Increase right ankle strength to 4+ to 5/5 to increase stability for functional tasks.   Time 8   Period Weeks   Status New   PT LONG TERM GOAL #4   Title Perform ADL's with pain not > 3/10.   Time 8   Period Weeks   Status New             Patient will benefit from skilled therapeutic intervention in order to improve the following deficits and impairments:  Pain, Decreased activity tolerance, Decreased range of motion, Decreased strength  Visit Diagnosis: Pain  in right ankle and joints of right foot  Stiffness of right ankle, not elsewhere classified     Problem List Patient Active Problem List   Diagnosis Date Noted  . Hammer toe of right foot 07/08/2015  . Bunion 07/08/2015  . Major depressive disorder, single episode, severe without psychotic features ([place])   . Major depressive disorder, single episode, severe ([place]) 11/14/2014  . Generalized anxiety disorder 11/14/2014  . Alcohol use disorder, severe, dependence ([place]) 11/14/2014    Class: Chronic  . Other hammer toe (acquired) 05/06/2014  . Pronation deformity of ankle, acquired 05/06/2014  . Chronic cough 12/01/2013  . Essential hypertension, benign 11/13/2012  . Depression 11/13/2012  . GAD (generalized anxiety disorder) 11/13/2012  . GERD (gastroesophageal reflux disease) 11/13/2012  . Insomnia 11/13/2012  . Post-operative state 10/30/2012    Meshell Abdulaziz, [name] 01/22/2016, 1:36 PM  [facility] [place], [place], [address] Phone: [phone]   Fax:   [phone]  Name: [name] MRN: [id number] Date of Birth: 04-28-1961

## 2016-01-26 ENCOUNTER — Ambulatory Visit: Payer: BLUE CROSS/BLUE SHIELD | Admitting: *Deleted

## 2016-01-26 DIAGNOSIS — M25671 Stiffness of right ankle, not elsewhere classified: Secondary | ICD-10-CM

## 2016-01-26 DIAGNOSIS — M25571 Pain in right ankle and joints of right foot: Secondary | ICD-10-CM

## 2016-01-26 NOTE — Therapy (Signed)
[facility] [place], [place], [address] Phone: [phone]   Fax:  [phone]  Physical Therapy Treatment  Patient Details  Name: [name] MRN: [id number] Date of Birth: June 07, 1961 Referring Provider: Ernesta Amble. Percell Miller MD  Encounter Date: 01/26/2016      PT End of Session - 01/26/16 1114    Visit Number 5   Number of Visits 16   Date for PT Re-Evaluation 03/08/16   PT Start Time 1030   PT Stop Time 1121   PT Time Calculation (min) 51 min      Past Medical History  Diagnosis Date  . HTN (hypertension)   . GERD (gastroesophageal reflux disease)   . Depression   . Chest pain   . Seizures [facility])     Past Surgical History  Procedure Laterality Date  . Tubal ligation    . Foot surgery Right 9/10    right foot-bunion-hammertoe  . Tubes in ears    . Metatarsal osteotomy with bunionectomy Left 08/02/12    McBride  . Lapidus fusion Left 08/02/12    There were no vitals filed for this visit.      Subjective Assessment - 01/26/16 1044    Subjective Patient has been walking without her boot at homes including short distances without shoes.  Patient tired today due to being up with her husband.    Limitations Walking   How long can you walk comfortably? I walk short distances without my boot.   Patient Stated Goals Get back to work.   Currently in Pain? Yes   Pain Score 2    Pain Location Ankle   Pain Orientation Right   Pain Descriptors / Indicators Aching   Pain Onset More than a month ago                         [facility] Adult PT Treatment/Exercise - 01/26/16 0001    Exercises   Exercises Ankle   Modalities   Modalities Electrical Stimulation;Vasopneumatic   Electrical Stimulation   Electrical Stimulation Location R superior dorsum of foot/ medial ankle   Electrical Stimulation Action premod   Electrical Stimulation Parameters 80-150hz  x 15 mins   Electrical Stimulation Goals Edema   Vasopneumatic   Number Minutes Vasopneumatic  13 minutes   Vasopnuematic Location  Ankle   Vasopneumatic Pressure Medium   Vasopneumatic Temperature  34   Manual Therapy   Manual Therapy Passive ROM   Passive ROM PROM while performing STW/M to patient's right ankle all motions   Ankle Exercises: Seated   Other Seated Ankle Exercises Seated rockerboard into rigtht ankle plantar and dosrsiflexion and inversion and eversion x 10 minutes.   Other Seated Ankle Exercises Seated R foot dynadisc DF/PF, Inv/Ev, circles x5 min                     PT Long Term Goals - 01/12/16 1250    PT LONG TERM GOAL #1   Title Ind with an advanced HEP.   Time 8   Period Weeks   Status New   PT LONG TERM GOAL #2   Title Increase right ankle dorsiflexion to  8 degrees to normalize the patient's gait pattern.   Time 8   Period Weeks   Status New   PT LONG TERM GOAL #3   Title Increase right ankle strength to 4+ to 5/5 to increase stability for functional tasks.   Time 8  Period Weeks   Status New   PT LONG TERM GOAL #4   Title Perform ADL's with pain not > 3/10.   Time 8   Period Weeks   Status New               Plan - 01/26/16 1318    Clinical Impression Statement Pt did fairly well with Rx today. She was able to complete all exs and tolerate manual PROM with minimal pain increase.She arrived wearing CAM boot today, but has walked some at home with out it. She feels her control over  her RT foot continues to improve. Normal modalitiy response.   Rehab Potential Excellent   PT Frequency 2x / week   PT Duration 8 weeks   PT Treatment/Interventions ADLs/Self Care Home Management;Electrical Stimulation;Cryotherapy;Moist Heat;Therapeutic exercise;Therapeutic activities;Gait training;Neuromuscular re-education;Patient/family education;Manual techniques;Passive range of motion   PT Next Visit Plan Continue R ankle ROM and strengthening exercises with modalities for pain and edema PRN  per MPT POC.   PT Home Exercise Plan HEP- resisted R ankle DF/Ev/Inv, ankle circles, seated heel and toe raises with yellow theraband   Consulted and Agree with Plan of Care Patient      Patient will benefit from skilled therapeutic intervention in order to improve the following deficits and impairments:  Pain, Decreased activity tolerance, Decreased range of motion, Decreased strength  Visit Diagnosis: Pain in right ankle and joints of right foot  Stiffness of right ankle, not elsewhere classified     Problem List Patient Active Problem List   Diagnosis Date Noted  . Hammer toe of right foot 07/08/2015  . Bunion 07/08/2015  . Major depressive disorder, single episode, severe without psychotic features ([place])   . Major depressive disorder, single episode, severe ([place]) 11/14/2014  . Generalized anxiety disorder 11/14/2014  . Alcohol use disorder, severe, dependence ([place]) 11/14/2014    Class: Chronic  . Other hammer toe (acquired) 05/06/2014  . Pronation deformity of ankle, acquired 05/06/2014  . Chronic cough 12/01/2013  . Essential hypertension, benign 11/13/2012  . Depression 11/13/2012  . GAD (generalized anxiety disorder) 11/13/2012  . GERD (gastroesophageal reflux disease) 11/13/2012  . Insomnia 11/13/2012  . Post-operative state 10/30/2012    RAMSEUR,CHRIS, PTA 01/26/2016, 1:26 PM  [facility] [place], [place], [address] Phone: [phone]   Fax:  [phone]  Name: [name] MRN: [id number] Date of Birth: Feb 27, 1961

## 2016-01-27 ENCOUNTER — Encounter: Payer: Self-pay | Admitting: *Deleted

## 2016-01-28 DIAGNOSIS — M5417 Radiculopathy, lumbosacral region: Secondary | ICD-10-CM | POA: Diagnosis not present

## 2016-01-28 DIAGNOSIS — M5412 Radiculopathy, cervical region: Secondary | ICD-10-CM | POA: Diagnosis not present

## 2016-01-28 DIAGNOSIS — R202 Paresthesia of skin: Secondary | ICD-10-CM | POA: Diagnosis not present

## 2016-01-28 DIAGNOSIS — G250 Essential tremor: Secondary | ICD-10-CM | POA: Diagnosis not present

## 2016-01-28 DIAGNOSIS — R262 Difficulty in walking, not elsewhere classified: Secondary | ICD-10-CM | POA: Diagnosis not present

## 2016-01-28 DIAGNOSIS — G5603 Carpal tunnel syndrome, bilateral upper limbs: Secondary | ICD-10-CM | POA: Diagnosis not present

## 2016-01-28 DIAGNOSIS — R201 Hypoesthesia of skin: Secondary | ICD-10-CM | POA: Diagnosis not present

## 2016-02-01 ENCOUNTER — Encounter: Payer: Self-pay | Admitting: *Deleted

## 2016-02-03 DIAGNOSIS — S82851D Displaced trimalleolar fracture of right lower leg, subsequent encounter for closed fracture with routine healing: Secondary | ICD-10-CM | POA: Diagnosis not present

## 2016-02-05 ENCOUNTER — Encounter: Payer: Self-pay | Admitting: *Deleted

## 2016-02-11 DIAGNOSIS — F331 Major depressive disorder, recurrent, moderate: Secondary | ICD-10-CM | POA: Diagnosis not present

## 2016-02-11 DIAGNOSIS — F410 Panic disorder [episodic paroxysmal anxiety] without agoraphobia: Secondary | ICD-10-CM | POA: Diagnosis not present

## 2016-03-11 DIAGNOSIS — W57XXXA Bitten or stung by nonvenomous insect and other nonvenomous arthropods, initial encounter: Secondary | ICD-10-CM | POA: Diagnosis not present

## 2016-03-11 DIAGNOSIS — B86 Scabies: Secondary | ICD-10-CM | POA: Diagnosis not present

## 2016-03-12 ENCOUNTER — Telehealth: Payer: Self-pay | Admitting: Family

## 2016-03-12 MED ORDER — LINDANE 1 % EX LOTN: 1.0000 "application " | TOPICAL_LOTION | Freq: Once | CUTANEOUS | 0 refills | Status: AC

## 2016-03-12 NOTE — Telephone Encounter (Signed)
lindane rx sent to pharmacy

## 2016-03-29 DIAGNOSIS — F410 Panic disorder [episodic paroxysmal anxiety] without agoraphobia: Secondary | ICD-10-CM | POA: Diagnosis not present

## 2016-03-29 DIAGNOSIS — F331 Major depressive disorder, recurrent, moderate: Secondary | ICD-10-CM | POA: Diagnosis not present

## 2016-04-04 ENCOUNTER — Other Ambulatory Visit: Payer: Self-pay | Admitting: *Deleted

## 2016-04-04 ENCOUNTER — Encounter: Payer: Self-pay | Admitting: Physician Assistant

## 2016-04-04 ENCOUNTER — Ambulatory Visit (INDEPENDENT_AMBULATORY_CARE_PROVIDER_SITE_OTHER): Payer: BLUE CROSS/BLUE SHIELD

## 2016-04-04 ENCOUNTER — Ambulatory Visit (INDEPENDENT_AMBULATORY_CARE_PROVIDER_SITE_OTHER): Payer: BLUE CROSS/BLUE SHIELD | Admitting: Physician Assistant

## 2016-04-04 VITALS — BP 121/86 | HR 93 | Temp 97.6°F | Ht 62.0 in | Wt 111.6 lb

## 2016-04-04 DIAGNOSIS — S4991XA Unspecified injury of right shoulder and upper arm, initial encounter: Secondary | ICD-10-CM

## 2016-04-04 DIAGNOSIS — S42201A Unspecified fracture of upper end of right humerus, initial encounter for closed fracture: Secondary | ICD-10-CM

## 2016-04-04 DIAGNOSIS — S42254A Nondisplaced fracture of greater tuberosity of right humerus, initial encounter for closed fracture: Secondary | ICD-10-CM | POA: Diagnosis not present

## 2016-04-04 MED ORDER — ESOMEPRAZOLE MAGNESIUM 20 MG PO CPDR: DELAYED_RELEASE_CAPSULE | ORAL | 1 refills | Status: DC

## 2016-04-04 NOTE — Patient Instructions (Signed)
How to Use a Shoulder Immobilizer A shoulder immobilizer is a device that you may have to wear after a shoulder injury or surgery. This device keeps your arm from moving. This prevents additional pain or injury. It also supports your arm next to your body as your shoulder heals. You may need to wear a shoulder immobilizer to treat a broken bone (fracture) in your shoulder. You may also need to wear one if you have an injury that moves your shoulder out of position (dislocation). There are different types of shoulder immobilizers. The one that you get depends on your injury. RISKS AND COMPLICATIONS Wearing a shoulder immobilizer in the wrong way can let your injured shoulder move around too much. This may delay healing and make your pain and swelling worse. HOW TO USE YOUR SHOULDER IMMOBILIZER  The part of the immobilizer that goes around your neck (sling) should support your upper arm, with your elbow bent and your lower arm and hand across your chest.  Make sure that your elbow:  Is snug against the back pocket of the sling.  Does not move away from your body.  The strap of the immobilizer should go over your shoulder and support your arm and hand. Your hand should be slightly higher than your elbow. It should not hang loosely over the edge of the sling.  If the long strap has a pad, place it where it is most comfortable on your neck.  Carefully follow your health care provider's instructions for wearing your shoulder immobilizer. Your health care provider may want you to:  Loosen your immobilizer to straighten your elbow and move your wrist and fingers. You may have to do this several times each day. Ask your health care provider when you should do this and how often.  Remove your immobilizer once every day to shower, but limit the movement in your injured arm. Before putting the immobilizer back on, use a towel to dry the area under your arm completely.  Remove your immobilizer to do  shoulder exercises at home as directed by your health care provider.  Wear your immobilizer while you sleep. You may sleep more comfortably if you have your upper body raised on pillows. SEEK MEDICAL CARE IF:  Your immobilizer is not supporting your arm properly.  Your immobilizer gets damaged.  You have worsening pain or swelling in your shoulder, arm, or hand.  Your shoulder, arm, or hand changes color or temperature.  You lose feeling in your shoulder, arm, or hand.   This information is not intended to replace advice given to you by your health care provider. Make sure you discuss any questions you have with your health care provider.   Document Released: 09/01/2004 Document Revised: 12/09/2014 Document Reviewed: 07/02/2014 Elsevier Interactive Patient Education [employer].

## 2016-04-04 NOTE — Progress Notes (Addendum)
[name] is a 55 y.o. female  Shoulder Pain: Patient complaints of right shoulder pain, . This is evaluated as a personal injury where she fell and landed on lateral portion of shoulder with arm to her body. The pain is described as aching and throbbing.  The onset of the pain was April 02, 2016.  The pain occurs continuously, yesterday milder and this morning much more severe and unable to move the joint .  Location is anterior, lateral. Symptoms are aggravated by all activities. Symptoms are diminisohed by  rest.   Limited activities include: all activities. Xray shows nondisplaced fracture of the humeral head. This is exactly where she was most tender. An appointment has been made with [facility]. It will be with Dr. Apolonio Schneiders 3:15 today. In the meantime she will be placed in a sling and sent to their office.  Past Medical History:  Diagnosis Date  . Chest pain   . Depression   . GERD (gastroesophageal reflux disease)   . HTN (hypertension)   . Seizures ([place])    Past Surgical History:  Procedure Laterality Date  . FOOT SURGERY Right 9/10   right foot-bunion-hammertoe  . Lapidus fusion Left 08/02/12  . METATARSAL OSTEOTOMY WITH BUNIONECTOMY Left 08/02/12   McBride  . TUBAL LIGATION    . Tubes in ears      Current Outpatient Prescriptions:  .  clonazePAM (KLONOPIN) 0.5 MG tablet, Take 0.5 mg by mouth. Take one to two tablets three times a day as needed.  Patient reported.   Dr Toy Care, psychiatry, prescriber, Disp: , Rfl:  .  escitalopram (LEXAPRO) 20 MG tablet, Take 20 mg by mouth daily., Disp: , Rfl:  .  esomeprazole (NEXIUM) 20 MG capsule, TAKE 1 CAPSULE BY MOUTH  DAILY AT 12 NOON., Disp: 90 capsule, Rfl: 1 .  hydrOXYzine (ATARAX/VISTARIL) 50 MG tablet, Take 1 tablet (50 mg total) by mouth 3 (three) times daily as needed., Disp: 90 tablet, Rfl: 0 .  mirtazapine (REMERON) 30 MG tablet, Take 30 mg by mouth at bedtime., Disp: , Rfl:  .  Multiple Vitamin (MULTI VITAMIN PO),  Take 1 tablet by mouth daily. , Disp: , Rfl:  .  oxyCODONE-acetaminophen (ROXICET) 5-325 MG tablet, Take 1 tablet by mouth every 6 (six) hours as needed for moderate pain or severe pain., Disp: 10 tablet, Rfl: 0 Allergies  Allergen Reactions  . Amlodipine Swelling    Ankle swelling   . Cortisporin [Neomycin-Polymyxin-Hc] Itching and Rash    reports that she has never smoked. She has never used smokeless tobacco. She reports that she drinks alcohol. She reports that she does not use drugs. Family History  Problem Relation Age of Onset  . Hypertension Mother   . Cancer Mother     kidney  . Diabetes Father   . Hypertension Father   All histories are reviewed today.   EXAM: GEN: WDWN female, mild distress due to pain HEENT: NCAT, with no deformity Heart: RRR, without murmurs, rubs or gallops. Shoulder: Patient is holding shoulder close to her body through the entire visit. She seems to be protecting it during any type of movement. All in x-ray she had a sudden movement and was in significant amount of pain. Palpation is positive with tenderness over AC joint or bicipital groove. ROM is most decreased in the anterior and lateral fields. She has slightly more movement in the posterior movement plane. Strength was not tested today today.  A/P: Shoulder fracture, humeral head, nondisplaced Referral  to Dr. [facility] 04/04/2016 3:15pm  Continue all maintenance meds as listed above, will defer the meds to Dr. Caralyn Guile, she is established with [facility] for pain management.  Sling and rest until appointment.

## 2016-04-29 DIAGNOSIS — S42254D Nondisplaced fracture of greater tuberosity of right humerus, subsequent encounter for fracture with routine healing: Secondary | ICD-10-CM | POA: Diagnosis not present

## 2016-05-05 ENCOUNTER — Ambulatory Visit: Payer: Self-pay | Admitting: Physical Therapy

## 2016-05-09 ENCOUNTER — Ambulatory Visit: Payer: BLUE CROSS/BLUE SHIELD | Attending: Orthopedic Surgery | Admitting: Physical Therapy

## 2016-05-09 DIAGNOSIS — M25511 Pain in right shoulder: Secondary | ICD-10-CM | POA: Diagnosis not present

## 2016-05-09 DIAGNOSIS — M25611 Stiffness of right shoulder, not elsewhere classified: Secondary | ICD-10-CM | POA: Diagnosis not present

## 2016-05-09 NOTE — Therapy (Signed)
[facility] [place], [place], [address] Phone: [phone]   Fax:  [phone]  Physical Therapy Treatment  Patient Details  Name: [name] MRN: [id number] Date of Birth: 1960/09/15 Referring Provider: Iran Planas MD  Encounter Date: 05/09/2016      PT End of Session - 05/09/16 1100    Visit Number 1   Number of Visits 12   Date for PT Re-Evaluation 06/06/16   PT Start Time 1036   PT Stop Time 1121   PT Time Calculation (min) 45 min   Activity Tolerance Patient tolerated treatment well   Behavior During Therapy [facility] for tasks assessed/performed      Past Medical History:  Diagnosis Date  . Chest pain   . Depression   . GERD (gastroesophageal reflux disease)   . HTN (hypertension)   . Seizures ([place])     Past Surgical History:  Procedure Laterality Date  . FOOT SURGERY Right 9/10   right foot-bunion-hammertoe  . Lapidus fusion Left 08/02/12  . METATARSAL OSTEOTOMY WITH BUNIONECTOMY Left 08/02/12   McBride  . TUBAL LIGATION    . Tubes in ears      There were no vitals filed for this visit.      Subjective Assessment - 05/09/16 1045    Subjective The patient reports that approximately 3 weeks ago the patient tripped over a box and fell upon her right shoulder.  This resulted in a non-displaced right humeral fracture of the greater tuberosity.  She has not been using a sling that much and has been doing the pendulum exercise.  Her pain is rated at a 6/10 today and higher (7+/10) when moving her right shoulder.     Patient Stated Goals Get use of my right arm and return to work.   Pain Score 6    Pain Location Shoulder   Pain Orientation Right   Pain Descriptors / Indicators Sharp   Pain Onset More than a month ago   Aggravating Factors  Movement.   Pain Relieving Factors Rest.            [facility] PT Assessment - 05/09/16 0001      Assessment   Medical Diagnosis Right humeral fracture.   Referring Provider Iran Planas MD     Precautions   Precaution Comments Begin with gentle right shoulder ROM.     Balance Screen   Has the patient fallen in the past 6 months Yes   How many times? --  2.   Has the patient had a decrease in activity level because of a fear of falling?  No   Is the patient reluctant to leave their home because of a fear of falling?  No     Home Environment   Living Environment Private residence     Prior Function   Level of Independence Independent     Observation/Other Assessments   Observations Mild right UE ecchymosis.     ROM / Strength   AROM / PROM / Strength AROM     AROM   Overall AROM Comments AAROM of right shoulder into flexion= 90 degrees; ER= 49 degres and and IR= 44 degrees.     Palpation   Palpation comment Tender in area of right shoulder greater tuberosity.     Ambulation/Gait   Gait Comments WNL.                     [facility] Adult PT Treatment/Exercise - 05/09/16  0001      Modalities   Modalities Social worker Location RT SHLD.   Electrical Stimulation Action IFC   Electrical Stimulation Parameters 80-150 HZ at 100% scan x 15 minutes.                     PT Long Term Goals - 05/09/16 1102      PT LONG TERM GOAL #1   Title Ind with a HEP.   Time 4   Period Weeks   Status New     PT LONG TERM GOAL #2   Title Active right shoulder flexion to 145 degrees so the patient can easily reach overhead   Time 4   Period Weeks   Status New     PT LONG TERM GOAL #3   Title Active ER to 70 degrees+ to allow for easily donning/doffing of apparel   Time 4   Period Weeks   Status New     PT LONG TERM GOAL #4   Title Increase ROM so patient is able to reach behind back   Time 4   Period Weeks   Status New     PT LONG TERM GOAL #5   Title Increase right shoulder strength to a solid 4 to 4+/5 to increase stability for performance of  functional activities   Time 4   Period Weeks   Status New     Additional Long Term Goals   Additional Long Term Goals Yes     PT LONG TERM GOAL #6   Title Perform ADL's with right shoulder pain not > 3/10.   Time 4   Period Weeks   Status New               Plan - 05/09/16 1100    Clinical Impression Statement The patient has marked right shoulder pain with ROM.  Her ROM is currently limited in multiple directions but overall is doing well.  She has mild ecchymosis remaining.  She is without a right shoulder sling today.   Rehab Potential Good   PT Frequency 3x / week   PT Duration 4 weeks   PT Treatment/Interventions ADLs/Self Care Home Management;Cryotherapy;Electrical Stimulation;Moist Heat;Therapeutic exercise;Therapeutic activities;Manual techniques;Passive range of motion   PT Next Visit Plan PAROM; PROM; AAROM of right shoulder and electrical stimulation.      Patient will benefit from skilled therapeutic intervention in order to improve the following deficits and impairments:  Decreased activity tolerance, Pain, Decreased range of motion  Visit Diagnosis: Acute pain of right shoulder - Plan: PT plan of care cert/re-cert  Stiffness of right shoulder, not elsewhere classified - Plan: PT plan of care cert/re-cert     Problem List Patient Active Problem List   Diagnosis Date Noted  . Hammer toe of right foot 07/08/2015  . Bunion 07/08/2015  . Major depressive disorder, single episode, severe without psychotic features ([place])   . Major depressive disorder, single episode, severe ([place]) 11/14/2014  . Generalized anxiety disorder 11/14/2014  . Alcohol use disorder, severe, dependence ([place]) 11/14/2014    Class: Chronic  . Other hammer toe (acquired) 05/06/2014  . Pronation deformity of ankle, acquired 05/06/2014  . Chronic cough 12/01/2013  . Essential hypertension, benign 11/13/2012  . Depression 11/13/2012  . GAD (generalized anxiety disorder) 11/13/2012  .  GERD (gastroesophageal reflux disease) 11/13/2012  . Insomnia 11/13/2012  . Post-operative state 10/30/2012    Kehaulani Fruin, [name]  05/09/2016, 11:42 AM  [facility] [place], [place], [address] Phone: [phone]   Fax:  [phone]  Name: [name] MRN: [id number] Date of Birth: Jun 29, 1961

## 2016-05-12 ENCOUNTER — Ambulatory Visit (INDEPENDENT_AMBULATORY_CARE_PROVIDER_SITE_OTHER): Payer: BLUE CROSS/BLUE SHIELD | Admitting: *Deleted

## 2016-05-12 DIAGNOSIS — Z23 Encounter for immunization: Secondary | ICD-10-CM

## 2016-05-13 ENCOUNTER — Encounter: Payer: Self-pay | Admitting: Physical Therapy

## 2016-05-13 ENCOUNTER — Ambulatory Visit: Payer: BLUE CROSS/BLUE SHIELD | Admitting: Physical Therapy

## 2016-05-13 DIAGNOSIS — M25611 Stiffness of right shoulder, not elsewhere classified: Secondary | ICD-10-CM | POA: Diagnosis not present

## 2016-05-13 DIAGNOSIS — M25511 Pain in right shoulder: Secondary | ICD-10-CM | POA: Diagnosis not present

## 2016-05-13 NOTE — Therapy (Signed)
[facility] [place], [place], [address] Phone: [phone]   Fax:  [phone]  Physical Therapy Treatment  Patient Details  Name: [name] MRN: [id number] Date of Birth: 05/23/1961 Referring Provider: Iran Planas MD  Encounter Date: 05/13/2016      PT End of Session - 05/13/16 1041    Visit Number 2   Number of Visits 12   Date for PT Re-Evaluation 06/06/16   PT Start Time [id number]   PT Stop Time 1127   PT Time Calculation (min) 42 min   Activity Tolerance Patient tolerated treatment well   Behavior During Therapy [facility] for tasks assessed/performed      Past Medical History:  Diagnosis Date  . Chest pain   . Depression   . GERD (gastroesophageal reflux disease)   . HTN (hypertension)   . Seizures ([place])     Past Surgical History:  Procedure Laterality Date  . FOOT SURGERY Right 9/10   right foot-bunion-hammertoe  . Lapidus fusion Left 08/02/12  . METATARSAL OSTEOTOMY WITH BUNIONECTOMY Left 08/02/12   McBride  . TUBAL LIGATION    . Tubes in ears      There were no vitals filed for this visit.      Subjective Assessment - 05/13/16 1041    Subjective Reports that R shoulder isn't doing too good today. Brought TENS unit to be set up for her. Reports she has been putting heat on her shoulder daily. Reports that she has been wanting to clean her bathtub and bought a scrub with a handle but cannot lift her arm.   Patient Stated Goals Get use of my right arm and return to work.   Currently in Pain? Yes   Pain Score 8    Pain Location Shoulder   Pain Orientation Right   Pain Descriptors / Indicators Sore;Sharp   Pain Onset More than a month ago            [facility] PT Assessment - 05/13/16 0001      Assessment   Medical Diagnosis Right humeral fracture.   Hand Dominance Right     Precautions   Precaution Comments Begin with gentle right shoulder ROM.                     [facility] Adult PT  Treatment/Exercise - 05/13/16 0001      Modalities   Modalities Electrical Stimulation;Moist Heat     Moist Heat Therapy   Number Minutes Moist Heat 15 Minutes   Moist Heat Location Shoulder     Electrical Stimulation   Electrical Stimulation Location R shoulder/ upper arm   Electrical Stimulation Action IFC   Electrical Stimulation Parameters 1-10 hz x15 min   Electrical Stimulation Goals Pain     Manual Therapy   Manual Therapy Passive ROM   Passive ROM PROM of R shoulder into flex/ER/IR with gentle holds at end range                     PT Long Term Goals - 05/09/16 1102      PT LONG TERM GOAL #1   Title Ind with a HEP.   Time 4   Period Weeks   Status New     PT LONG TERM GOAL #2   Title Active right shoulder flexion to 145 degrees so the patient can easily reach overhead   Time 4   Period Weeks   Status New  PT LONG TERM GOAL #3   Title Active ER to 70 degrees+ to allow for easily donning/doffing of apparel   Time 4   Period Weeks   Status New     PT LONG TERM GOAL #4   Title Increase ROM so patient is able to reach behind back   Time 4   Period Weeks   Status New     PT LONG TERM GOAL #5   Title Increase right shoulder strength to a solid 4 to 4+/5 to increase stability for performance of functional activities   Time 4   Period Weeks   Status New     Additional Long Term Goals   Additional Long Term Goals Yes     PT LONG TERM GOAL #6   Title Perform ADL's with right shoulder pain not > 3/10.   Time 4   Period Weeks   Status New               Plan - 05/13/16 1120    Clinical Impression Statement Patient presented in clinic with high level R shoulder pain. Patient required frequent R shoulder oscillations to promote relaxation of R shoulder during PROM. Firm end feels noted with PROM of R shoulder into all directions assessed. Patient presented in clinic without sling donned. Normal modalities response noted following removal  of the modalities. Patient educated regarding set up of TENS unit for home with written instructions for home with patient verablizing understanding.    Rehab Potential Good   PT Frequency 3x / week   PT Duration 4 weeks   PT Treatment/Interventions ADLs/Self Care Home Management;Cryotherapy;Electrical Stimulation;Moist Heat;Therapeutic exercise;Therapeutic activities;Manual techniques;Passive range of motion   PT Next Visit Plan Continue with PROM and modalities of R shoulder per MPT POC.   PT Home Exercise Plan HEP- resisted R ankle DF/Ev/Inv, ankle circles, seated heel and toe raises with yellow theraband   Consulted and Agree with Plan of Care Patient      Patient will benefit from skilled therapeutic intervention in order to improve the following deficits and impairments:  Decreased activity tolerance, Pain, Decreased range of motion  Visit Diagnosis: Acute pain of right shoulder  Stiffness of right shoulder, not elsewhere classified     Problem List Patient Active Problem List   Diagnosis Date Noted  . Hammer toe of right foot 07/08/2015  . Bunion 07/08/2015  . Major depressive disorder, single episode, severe without psychotic features ([place])   . Major depressive disorder, single episode, severe ([place]) 11/14/2014  . Generalized anxiety disorder 11/14/2014  . Alcohol use disorder, severe, dependence ([place]) 11/14/2014    Class: Chronic  . Other hammer toe (acquired) 05/06/2014  . Pronation deformity of ankle, acquired 05/06/2014  . Chronic cough 12/01/2013  . Essential hypertension, benign 11/13/2012  . Depression 11/13/2012  . GAD (generalized anxiety disorder) 11/13/2012  . GERD (gastroesophageal reflux disease) 11/13/2012  . Insomnia 11/13/2012  . Post-operative state 10/30/2012    Wynelle Fanny, PTA 05/13/2016, 12:15 PM  [facility] [address] [place], [place], [address] Phone: [phone]   Fax:   [phone]  Name: SOLOMIA KEPPLER MRN: [id number] Date of Birth: 1961/06/17

## 2016-05-16 ENCOUNTER — Ambulatory Visit: Payer: BLUE CROSS/BLUE SHIELD | Admitting: *Deleted

## 2016-05-17 ENCOUNTER — Ambulatory Visit: Payer: BLUE CROSS/BLUE SHIELD | Admitting: Physical Therapy

## 2016-05-17 DIAGNOSIS — M25611 Stiffness of right shoulder, not elsewhere classified: Secondary | ICD-10-CM

## 2016-05-17 DIAGNOSIS — M25511 Pain in right shoulder: Secondary | ICD-10-CM

## 2016-05-17 NOTE — Therapy (Signed)
[facility] [place], [place], [address] Phone: [phone]   Fax:  [phone]  Physical Therapy Treatment  Patient Details  Name: [name] MRN: [id number] Date of Birth: June 24, 1961 Referring Provider: Iran Planas MD  Encounter Date: 05/17/2016      PT End of Session - 05/17/16 1037    Visit Number 3   Number of Visits 12   Date for PT Re-Evaluation 06/06/16   PT Start Time 1037   PT Stop Time 1119   PT Time Calculation (min) 42 min   Activity Tolerance Patient tolerated treatment well   Behavior During Therapy [facility] for tasks assessed/performed      Past Medical History:  Diagnosis Date  . Chest pain   . Depression   . GERD (gastroesophageal reflux disease)   . HTN (hypertension)   . Seizures ([place])     Past Surgical History:  Procedure Laterality Date  . FOOT SURGERY Right 9/10   right foot-bunion-hammertoe  . Lapidus fusion Left 08/02/12  . METATARSAL OSTEOTOMY WITH BUNIONECTOMY Left 08/02/12   McBride  . TUBAL LIGATION    . Tubes in ears      There were no vitals filed for this visit.      Subjective Assessment - 05/17/16 1115    Subjective Reports that she forgot to turn off her TENS unit and shocked herself recently. Reports that she was up all night at the ER with her husband.   Patient Stated Goals Get use of my right arm and return to work.   Currently in Pain? Yes   Pain Score 2    Pain Location Shoulder   Pain Orientation Right   Pain Descriptors / Indicators Sore   Pain Onset More than a month ago            [facility] PT Assessment - 05/17/16 0001      Assessment   Medical Diagnosis Right humeral fracture.   Hand Dominance Right     Precautions   Precaution Comments Begin with gentle right shoulder ROM.                     [facility] Adult PT Treatment/Exercise - 05/17/16 0001      Modalities   Modalities Electrical Stimulation;Moist Heat     Moist Heat Therapy    Number Minutes Moist Heat 15 Minutes   Moist Heat Location Shoulder     Electrical Stimulation   Electrical Stimulation Location R shoulder/ upper arm   Electrical Stimulation Action IFC   Electrical Stimulation Parameters 1-10 hz x15 min   Electrical Stimulation Goals Pain     Manual Therapy   Manual Therapy Passive ROM   Passive ROM PROM of R shoulder into flex/ER/IR with gentle holds at end range                     PT Long Term Goals - 05/09/16 1102      PT LONG TERM GOAL #1   Title Ind with a HEP.   Time 4   Period Weeks   Status New     PT LONG TERM GOAL #2   Title Active right shoulder flexion to 145 degrees so the patient can easily reach overhead   Time 4   Period Weeks   Status New     PT LONG TERM GOAL #3   Title Active ER to 70 degrees+ to allow for easily donning/doffing of apparel  Time 4   Period Weeks   Status New     PT LONG TERM GOAL #4   Title Increase ROM so patient is able to reach behind back   Time 4   Period Weeks   Status New     PT LONG TERM GOAL #5   Title Increase right shoulder strength to a solid 4 to 4+/5 to increase stability for performance of functional activities   Time 4   Period Weeks   Status New     Additional Long Term Goals   Additional Long Term Goals Yes     PT LONG TERM GOAL #6   Title Perform ADL's with right shoulder pain not > 3/10.   Time 4   Period Weeks   Status New               Plan - 05/17/16 1124    Clinical Impression Statement Patient arrived to treatment with low level R shoulder soreness this morning. Patient's AAROM of R shoulder flexion measured as 107 deg in supine per patient request. Patient requires frequent oscillations to promote relaxation of RUE during PROM of R shoulder. Firm end feels noted with PROM of R shoulder into flexion/ER/ IR. Normal modalities response noted following removal of the modalities. Goals remain on-going at this time secondary to protocol  limitations.   Rehab Potential Good   PT Frequency 3x / week   PT Duration 4 weeks   PT Treatment/Interventions ADLs/Self Care Home Management;Cryotherapy;Electrical Stimulation;Moist Heat;Therapeutic exercise;Therapeutic activities;Manual techniques;Passive range of motion   PT Next Visit Plan Continue with PROM and modalities of R shoulder per MPT POC.   Consulted and Agree with Plan of Care Patient      Patient will benefit from skilled therapeutic intervention in order to improve the following deficits and impairments:  Decreased activity tolerance, Pain, Decreased range of motion  Visit Diagnosis: Acute pain of right shoulder  Stiffness of right shoulder, not elsewhere classified     Problem List Patient Active Problem List   Diagnosis Date Noted  . Hammer toe of right foot 07/08/2015  . Bunion 07/08/2015  . Major depressive disorder, single episode, severe without psychotic features ([place])   . Major depressive disorder, single episode, severe ([place]) 11/14/2014  . Generalized anxiety disorder 11/14/2014  . Alcohol use disorder, severe, dependence ([place]) 11/14/2014    Class: Chronic  . Other hammer toe (acquired) 05/06/2014  . Pronation deformity of ankle, acquired 05/06/2014  . Chronic cough 12/01/2013  . Essential hypertension, benign 11/13/2012  . Depression 11/13/2012  . GAD (generalized anxiety disorder) 11/13/2012  . GERD (gastroesophageal reflux disease) 11/13/2012  . Insomnia 11/13/2012  . Post-operative state 10/30/2012    Wynelle Fanny, PTA 05/17/2016, 11:34 AM  [facility] [address] [place], [place], [address] Phone: [phone]   Fax:  [phone]  Name: GIANELLY SIME MRN: [id number] Date of Birth: 21-Nov-1960

## 2016-05-19 ENCOUNTER — Encounter: Payer: Self-pay | Admitting: Physical Therapy

## 2016-05-20 ENCOUNTER — Ambulatory Visit: Payer: BLUE CROSS/BLUE SHIELD | Admitting: Physical Therapy

## 2016-05-20 ENCOUNTER — Encounter: Payer: Self-pay | Admitting: Physical Therapy

## 2016-05-20 DIAGNOSIS — M25611 Stiffness of right shoulder, not elsewhere classified: Secondary | ICD-10-CM

## 2016-05-20 DIAGNOSIS — M25511 Pain in right shoulder: Secondary | ICD-10-CM

## 2016-05-20 NOTE — Therapy (Signed)
[facility] [place], [place], [address] Phone: [phone]   Fax:  [phone]  Physical Therapy Treatment  Patient Details  Name: [name] MRN: [id number] Date of Birth: [birth year] Referring Provider: Iran Planas MD  Encounter Date: 05/20/2016      PT End of Session - 05/20/16 1110    Visit Number 4   Number of Visits 12   Date for PT Re-Evaluation 06/06/16   PT Start Time 1034   PT Stop Time 1120   PT Time Calculation (min) 46 min   Activity Tolerance Patient tolerated treatment well   Behavior During Therapy [facility] for tasks assessed/performed      Past Medical History:  Diagnosis Date  . Chest pain   . Depression   . GERD (gastroesophageal reflux disease)   . HTN (hypertension)   . Seizures ([place])     Past Surgical History:  Procedure Laterality Date  . FOOT SURGERY Right 9/10   right foot-bunion-hammertoe  . Lapidus fusion Left 08/02/12  . METATARSAL OSTEOTOMY WITH BUNIONECTOMY Left 08/02/12   McBride  . TUBAL LIGATION    . Tubes in ears      There were no vitals filed for this visit.      Subjective Assessment - 05/20/16 1108    Subjective Reports that her shoulder isn't good today as she thinks she may have overworked it at home. Reports that her TENS unit blew up on her at home so she threw it away but has been using heat at home.    Patient Stated Goals Get use of my right arm and return to work.   Currently in Pain? Yes   Pain Score 8    Pain Location Shoulder   Pain Orientation Right   Pain Descriptors / Indicators Aching   Pain Type Acute pain   Pain Onset More than a month ago   Pain Frequency Constant            [facility] PT Assessment - 05/20/16 0001      Assessment   Medical Diagnosis Right humeral fracture.   Hand Dominance Right     Precautions   Precaution Comments Begin with gentle right shoulder ROM.     ROM / Strength   AROM / PROM / Strength PROM     PROM   Overall PROM  Deficits   PROM Assessment Site Shoulder   Right/Left Shoulder Right   Right Shoulder Flexion 110 Degrees  AAROM   Right Shoulder External Rotation 50 Degrees                     [facility] Adult PT Treatment/Exercise - 05/20/16 0001      Modalities   Modalities Electrical Stimulation;Moist Heat     Moist Heat Therapy   Number Minutes Moist Heat 15 Minutes   Moist Heat Location Shoulder     Electrical Stimulation   Electrical Stimulation Location R shoulder/ upper arm   Electrical Stimulation Action IFC   Electrical Stimulation Parameters 1-10 hz x15 min   Electrical Stimulation Goals Pain     Manual Therapy   Manual Therapy Passive ROM   Passive ROM PROM of R shoulder into flex/ER/IR with gentle holds at end range                     PT Long Term Goals - 05/09/16 1102      PT LONG TERM GOAL #1   Title Ind  with a HEP.   Time 4   Period Weeks   Status New     PT LONG TERM GOAL #2   Title Active right shoulder flexion to 145 degrees so the patient can easily reach overhead   Time 4   Period Weeks   Status New     PT LONG TERM GOAL #3   Title Active ER to 70 degrees+ to allow for easily donning/doffing of apparel   Time 4   Period Weeks   Status New     PT LONG TERM GOAL #4   Title Increase ROM so patient is able to reach behind back   Time 4   Period Weeks   Status New     PT LONG TERM GOAL #5   Title Increase right shoulder strength to a solid 4 to 4+/5 to increase stability for performance of functional activities   Time 4   Period Weeks   Status New     Additional Long Term Goals   Additional Long Term Goals Yes     PT LONG TERM GOAL #6   Title Perform ADL's with right shoulder pain not > 3/10.   Time 4   Period Weeks   Status New               Plan - 05/20/16 1113    Clinical Impression Statement Patient arrived to treatment with reports of increased R shoulder ache from activity at home. Patient  required VCs for R shoulder relaxation and frequent oscillations were utilized to promote relaxation of RUE. Firm end feels noted with PROM of R shoulder into flex/ER/IR. AAROM of R shoulder flexion measured as 110 deg in supine and ER measured as 50 deg passively. Normal modalities response noted following removal of the modalities. Patient educated that she may have need rest in order to decrease pain that she is experiencing prior to treatment.   Rehab Potential Good   PT Frequency 3x / week   PT Duration 4 weeks   PT Treatment/Interventions ADLs/Self Care Home Management;Cryotherapy;Electrical Stimulation;Moist Heat;Therapeutic exercise;Therapeutic activities;Manual techniques;Passive range of motion   PT Next Visit Plan Continue with PROM and modalities of R shoulder per MPT POC.   Consulted and Agree with Plan of Care Patient      Patient will benefit from skilled therapeutic intervention in order to improve the following deficits and impairments:  Decreased activity tolerance, Pain, Decreased range of motion  Visit Diagnosis: Acute pain of right shoulder  Stiffness of right shoulder, not elsewhere classified     Problem List Patient Active Problem List   Diagnosis Date Noted  . Hammer toe of right foot 07/08/2015  . Bunion 07/08/2015  . Major depressive disorder, single episode, severe without psychotic features ([place])   . Major depressive disorder, single episode, severe ([place]) 11/14/2014  . Generalized anxiety disorder 11/14/2014  . Alcohol use disorder, severe, dependence ([place]) 11/14/2014    Class: Chronic  . Other hammer toe (acquired) 05/06/2014  . Pronation deformity of ankle, acquired 05/06/2014  . Chronic cough 12/01/2013  . Essential hypertension, benign 11/13/2012  . Depression 11/13/2012  . GAD (generalized anxiety disorder) 11/13/2012  . GERD (gastroesophageal reflux disease) 11/13/2012  . Insomnia 11/13/2012  . Post-operative state 10/30/2012    Wynelle Fanny, PTA 05/20/2016, 11:27 AM  [facility] [address] [place], [place], [address] Phone: [phone]   Fax:  [phone]  Name: [name] MRN: [id number] Date of Birth: 09/27/1960

## 2016-05-24 ENCOUNTER — Ambulatory Visit: Payer: BLUE CROSS/BLUE SHIELD | Admitting: *Deleted

## 2016-05-24 DIAGNOSIS — M25511 Pain in right shoulder: Secondary | ICD-10-CM

## 2016-05-24 DIAGNOSIS — M25611 Stiffness of right shoulder, not elsewhere classified: Secondary | ICD-10-CM | POA: Diagnosis not present

## 2016-05-24 NOTE — Therapy (Signed)
[facility] [place], [place], [address] Phone: [phone]   Fax:  [phone]  Physical Therapy Treatment  Patient Details  Name: [name] MRN: [id number] Date of Birth: [birth year] Referring Provider: Iran Planas MD  Encounter Date: 05/24/2016      PT End of Session - 05/24/16 1121    Visit Number 5   Number of Visits 12   Date for PT Re-Evaluation 06/06/16   PT Start Time [id number]   PT Stop Time 1125   PT Time Calculation (min) 45 min      Past Medical History:  Diagnosis Date  . Chest pain   . Depression   . GERD (gastroesophageal reflux disease)   . HTN (hypertension)   . Seizures ([place])     Past Surgical History:  Procedure Laterality Date  . FOOT SURGERY Right 9/10   right foot-bunion-hammertoe  . Lapidus fusion Left 08/02/12  . METATARSAL OSTEOTOMY WITH BUNIONECTOMY Left 08/02/12   McBride  . TUBAL LIGATION    . Tubes in ears      There were no vitals filed for this visit.                       [facility] Adult PT Treatment/Exercise - 05/24/16 0001      Modalities   Modalities Electrical Stimulation;Moist Heat     Moist Heat Therapy   Number Minutes Moist Heat 15 Minutes   Moist Heat Location Shoulder     Electrical Stimulation   Electrical Stimulation Location R shoulder/ upper arm  IFC 1-10hz  x 15 mins    Electrical Stimulation Goals Pain     Manual Therapy   Manual Therapy Passive ROM   Passive ROM PROM / PAROM of R shoulder into flex/ER/IR with gentle holds at end range                PT Education - 05/24/16 1121    Education provided Yes   Education Details table top slides   Person(s) Educated Patient   Methods Explanation;Demonstration;Tactile cues;Verbal cues   Comprehension Verbalized understanding;Returned demonstration             PT Long Term Goals - 05/09/16 1102      PT LONG TERM GOAL #1   Title Ind with a HEP.   Time 4   Period Weeks    Status New     PT LONG TERM GOAL #2   Title Active right shoulder flexion to 145 degrees so the patient can easily reach overhead   Time 4   Period Weeks   Status New     PT LONG TERM GOAL #3   Title Active ER to 70 degrees+ to allow for easily donning/doffing of apparel   Time 4   Period Weeks   Status New     PT LONG TERM GOAL #4   Title Increase ROM so patient is able to reach behind back   Time 4   Period Weeks   Status New     PT LONG TERM GOAL #5   Title Increase right shoulder strength to a solid 4 to 4+/5 to increase stability for performance of functional activities   Time 4   Period Weeks   Status New     Additional Long Term Goals   Additional Long Term Goals Yes     PT LONG TERM GOAL #6   Title Perform ADL's with right shoulder pain not > 3/10.  Time 4   Period Weeks   Status New               Plan - 05/24/16 1041    Clinical Impression Statement Pt arrived to clinic 10 mins late and was more emotional today due to things going on at home with her husband. She did fairly well with Rx today with V/Cs to relax. She was able to reach 130 degrees in flexion and 60 in ER with PAROM. She continues to complai of pain in RT shldr and will F/U with MD this Friday   Rehab Potential Good   PT Frequency 3x / week   PT Duration 4 weeks   PT Treatment/Interventions ADLs/Self Care Home Management;Cryotherapy;Electrical Stimulation;Moist Heat;Therapeutic exercise;Therapeutic activities;Manual techniques;Passive range of motion   PT Next Visit Plan Continue with PROM and modalities of R shoulder per MPT POC.      Patient will benefit from skilled therapeutic intervention in order to improve the following deficits and impairments:  Decreased activity tolerance, Pain, Decreased range of motion  Visit Diagnosis: Acute pain of right shoulder  Stiffness of right shoulder, not elsewhere classified     Problem List Patient Active Problem List   Diagnosis Date  Noted  . Hammer toe of right foot 07/08/2015  . Bunion 07/08/2015  . Major depressive disorder, single episode, severe without psychotic features ([place])   . Major depressive disorder, single episode, severe ([place]) 11/14/2014  . Generalized anxiety disorder 11/14/2014  . Alcohol use disorder, severe, dependence ([place]) 11/14/2014    Class: Chronic  . Other hammer toe (acquired) 05/06/2014  . Pronation deformity of ankle, acquired 05/06/2014  . Chronic cough 12/01/2013  . Essential hypertension, benign 11/13/2012  . Depression 11/13/2012  . GAD (generalized anxiety disorder) 11/13/2012  . GERD (gastroesophageal reflux disease) 11/13/2012  . Insomnia 11/13/2012  . Post-operative state 10/30/2012    RAMSEUR,CHRIS, PTA 05/24/2016, 11:30 AM  [facility] [place], [place], [address] Phone: [phone]   Fax:  [phone]  Name: FLORIAN LITE MRN: [id number] Date of Birth: Jul 23, 1961

## 2016-05-26 ENCOUNTER — Ambulatory Visit: Payer: BLUE CROSS/BLUE SHIELD | Admitting: *Deleted

## 2016-05-26 DIAGNOSIS — M25611 Stiffness of right shoulder, not elsewhere classified: Secondary | ICD-10-CM

## 2016-05-26 DIAGNOSIS — M25511 Pain in right shoulder: Secondary | ICD-10-CM

## 2016-05-26 NOTE — Therapy (Signed)
[facility] [place], [place], [address] Phone: [phone]   Fax:  [phone]  Physical Therapy Treatment  Patient Details  Name: [name] MRN: [id number] Date of Birth: [birth year] Referring Provider: Iran Planas MD  Encounter Date: 05/26/2016      PT End of Session - 05/26/16 1043    Visit Number 6   Number of Visits 12   Date for PT Re-Evaluation 06/06/16   PT Start Time 1044  14 mins late   PT Stop Time 1133   PT Time Calculation (min) 49 min      Past Medical History:  Diagnosis Date  . Chest pain   . Depression   . GERD (gastroesophageal reflux disease)   . HTN (hypertension)   . Seizures ([place])     Past Surgical History:  Procedure Laterality Date  . FOOT SURGERY Right 9/10   right foot-bunion-hammertoe  . Lapidus fusion Left 08/02/12  . METATARSAL OSTEOTOMY WITH BUNIONECTOMY Left 08/02/12   McBride  . TUBAL LIGATION    . Tubes in ears      There were no vitals filed for this visit.      Subjective Assessment - 05/26/16 1046    Subjective 15 mins late.  RT shldr 7/10 today   Patient Stated Goals Get use of my right arm and return to work.   Currently in Pain? Yes   Pain Score 7    Pain Location Shoulder   Pain Orientation Right   Pain Descriptors / Indicators Aching   Pain Type Acute pain   Pain Onset More than a month ago   Pain Frequency Constant            [facility] PT Assessment - 05/26/16 0001      PROM   Overall PROM  Deficits   PROM Assessment Site Shoulder   Right/Left Shoulder Right   Right Shoulder Flexion 150 Degrees   Right Shoulder ABduction 100 Degrees  largest deficit   Right Shoulder Internal Rotation 65 Degrees   Right Shoulder External Rotation 60 Degrees                     [facility] Adult PT Treatment/Exercise - 05/26/16 0001      Exercises   Exercises Shoulder     Shoulder Exercises: Supine   Other Supine Exercises supine cane press 2x10      Shoulder Exercises: Seated   Other Seated Exercises UE ranger flexion/extension, circles eachway     Modalities   Modalities Electrical Stimulation;Moist Heat     Moist Heat Therapy   Number Minutes Moist Heat 15 Minutes   Moist Heat Location Shoulder     Electrical Stimulation   Electrical Stimulation Location R shoulder/ upper arm  IFC 1-10hz  x 15 mins    Electrical Stimulation Goals Pain     Manual Therapy   Manual Therapy Passive ROM   Passive ROM PROM / PAROM / AAROM of R shoulder into flex/ER/IR with gentle holds at end range                     PT Long Term Goals - 05/09/16 1102      PT LONG TERM GOAL #1   Title Ind with a HEP.   Time 4   Period Weeks   Status New     PT LONG TERM GOAL #2   Title Active right shoulder flexion to 145 degrees so the patient can  easily reach overhead   Time 4   Period Weeks   Status New     PT LONG TERM GOAL #3   Title Active ER to 70 degrees+ to allow for easily donning/doffing of apparel   Time 4   Period Weeks   Status New     PT LONG TERM GOAL #4   Title Increase ROM so patient is able to reach behind back   Time 4   Period Weeks   Status New     PT LONG TERM GOAL #5   Title Increase right shoulder strength to a solid 4 to 4+/5 to increase stability for performance of functional activities   Time 4   Period Weeks   Status New     Additional Long Term Goals   Additional Long Term Goals Yes     PT LONG TERM GOAL #6   Title Perform ADL's with right shoulder pain not > 3/10.   Time 4   Period Weeks   Status New               Plan - 05/26/16 1254    Clinical Impression Statement Pt did fairly well with Rx today, but continues to need V/Cs to relax during manual PROM. She actually does better with AAROM due to less resistance. She was advised to perform HEP more often (table top motions and supine cane exs and these were reviewed today. Her ROM was improved with  Flexion to 150 degrees, ER  60, IR to 65 degrees. Abduction ROM was limited to 102 degrees   Rehab Potential Good   PT Frequency 3x / week   PT Duration 4 weeks   PT Treatment/Interventions ADLs/Self Care Home Management;Cryotherapy;Electrical Stimulation;Moist Heat;Therapeutic exercise;Therapeutic activities;Manual techniques;Passive range of motion   PT Next Visit Plan Continue with PROM and modalities of R shoulder per MPT POC.    Pt to MD tomorrow   Consulted and Agree with Plan of Care Patient      Patient will benefit from skilled therapeutic intervention in order to improve the following deficits and impairments:  Decreased activity tolerance, Pain, Decreased range of motion  Visit Diagnosis: Acute pain of right shoulder  Stiffness of right shoulder, not elsewhere classified     Problem List Patient Active Problem List   Diagnosis Date Noted  . Hammer toe of right foot 07/08/2015  . Bunion 07/08/2015  . Major depressive disorder, single episode, severe without psychotic features ([place])   . Major depressive disorder, single episode, severe ([place]) 11/14/2014  . Generalized anxiety disorder 11/14/2014  . Alcohol use disorder, severe, dependence ([place]) 11/14/2014    Class: Chronic  . Other hammer toe (acquired) 05/06/2014  . Pronation deformity of ankle, acquired 05/06/2014  . Chronic cough 12/01/2013  . Essential hypertension, benign 11/13/2012  . Depression 11/13/2012  . GAD (generalized anxiety disorder) 11/13/2012  . GERD (gastroesophageal reflux disease) 11/13/2012  . Insomnia 11/13/2012  . Post-operative state 10/30/2012    APPLEGATE, Mali, PTA 05/26/2016, 5:31 PM Mali Applegate MPT [facility] [place], [place], [address] Phone: [phone]   Fax:  [phone]  Name: [name] MRN: [id number] Date of Birth: 12-06-1960

## 2016-05-27 DIAGNOSIS — S42254D Nondisplaced fracture of greater tuberosity of right humerus, subsequent encounter for fracture with routine healing: Secondary | ICD-10-CM | POA: Diagnosis not present

## 2016-05-30 ENCOUNTER — Ambulatory Visit: Payer: BLUE CROSS/BLUE SHIELD | Admitting: Physical Therapy

## 2016-05-30 ENCOUNTER — Encounter: Payer: Self-pay | Admitting: Physical Therapy

## 2016-05-30 DIAGNOSIS — M25611 Stiffness of right shoulder, not elsewhere classified: Secondary | ICD-10-CM

## 2016-05-30 DIAGNOSIS — M25511 Pain in right shoulder: Secondary | ICD-10-CM | POA: Diagnosis not present

## 2016-05-30 NOTE — Therapy (Signed)
[facility] [place], [place], [address] Phone: [phone]   Fax:  [phone]  Physical Therapy Treatment  Patient Details  Name: [name] MRN: [id number] Date of Birth: 13-Mar-1961 Referring Provider: Iran Planas MD  Encounter Date: 05/30/2016      PT End of Session - 05/30/16 1147    Visit Number 7   Number of Visits 12   Date for PT Re-Evaluation 06/06/16   PT Start Time 1118   PT Stop Time 1202   PT Time Calculation (min) 44 min   Activity Tolerance Patient tolerated treatment well   Behavior During Therapy [facility] for tasks assessed/performed      Past Medical History:  Diagnosis Date  . Chest pain   . Depression   . GERD (gastroesophageal reflux disease)   . HTN (hypertension)   . Seizures ([place])     Past Surgical History:  Procedure Laterality Date  . FOOT SURGERY Right 9/10   right foot-bunion-hammertoe  . Lapidus fusion Left 08/02/12  . METATARSAL OSTEOTOMY WITH BUNIONECTOMY Left 08/02/12   McBride  . TUBAL LIGATION    . Tubes in ears      There were no vitals filed for this visit.      Subjective Assessment - 05/30/16 1122    Subjective Patient reported ongoing pain in shouler and tolerated last treatment well. Patient reported she went to MD and is to continue therapy and her shoulder is healing well.   Patient Stated Goals Get use of my right arm and return to work.   Currently in Pain? Yes   Pain Score 5    Pain Location Shoulder   Pain Orientation Right   Pain Descriptors / Indicators Aching   Pain Type Acute pain   Pain Onset More than a month ago   Pain Frequency Constant   Aggravating Factors  movement of shoulder   Pain Relieving Factors rest            [facility] PT Assessment - 05/30/16 0001      PROM   PROM Assessment Site Shoulder   Right/Left Shoulder Right   Right Shoulder Flexion 150 Degrees   Right Shoulder External Rotation 60 Degrees                      [facility] Adult PT Treatment/Exercise - 05/30/16 0001      Shoulder Exercises: Supine   Other Supine Exercises supine cane press 2x10     Shoulder Exercises: Seated   Other Seated Exercises UE ranger flexion/extension, circles eachway x46min     Moist Heat Therapy   Number Minutes Moist Heat 15 Minutes   Moist Heat Location Shoulder     Electrical Stimulation   Electrical Stimulation Location R shoulder/ upper arm  IFC 1-10hz  x 15 mins    Electrical Stimulation Goals Pain     Manual Therapy   Manual Therapy Passive ROM   Passive ROM PROM / PAROM / AAROM of R shoulder into flex/ER/IR with gentle holds at end range                     PT Long Term Goals - 05/09/16 1102      PT LONG TERM GOAL #1   Title Ind with a HEP.   Time 4   Period Weeks   Status New     PT LONG TERM GOAL #2   Title Active right shoulder flexion to 145 degrees  so the patient can easily reach overhead   Time 4   Period Weeks   Status New     PT LONG TERM GOAL #3   Title Active ER to 70 degrees+ to allow for easily donning/doffing of apparel   Time 4   Period Weeks   Status New     PT LONG TERM GOAL #4   Title Increase ROM so patient is able to reach behind back   Time 4   Period Weeks   Status New     PT LONG TERM GOAL #5   Title Increase right shoulder strength to a solid 4 to 4+/5 to increase stability for performance of functional activities   Time 4   Period Weeks   Status New     Additional Long Term Goals   Additional Long Term Goals Yes     PT LONG TERM GOAL #6   Title Perform ADL's with right shoulder pain not > 3/10.   Time 4   Period Weeks   Status New               Plan - 05/30/16 1148    Clinical Impression Statement Patient progressing today with good tolerance to treatment, no reported pain increase and consistant with ROM measurements today compared to last treatment. Patient unable to meet any further goals due to ROM,  pain and strength deficits.   Rehab Potential Good   PT Frequency 3x / week   PT Duration 4 weeks   PT Treatment/Interventions ADLs/Self Care Home Management;Cryotherapy;Electrical Stimulation;Moist Heat;Therapeutic exercise;Therapeutic activities;Manual techniques;Passive range of motion   PT Next Visit Plan Continue with PROM and modalities of R shoulder per MPT POC.     Consulted and Agree with Plan of Care Patient      Patient will benefit from skilled therapeutic intervention in order to improve the following deficits and impairments:  Decreased activity tolerance, Pain, Decreased range of motion  Visit Diagnosis: Acute pain of right shoulder  Stiffness of right shoulder, not elsewhere classified     Problem List Patient Active Problem List   Diagnosis Date Noted  . Hammer toe of right foot 07/08/2015  . Bunion 07/08/2015  . Major depressive disorder, single episode, severe without psychotic features ([place])   . Major depressive disorder, single episode, severe ([place]) 11/14/2014  . Generalized anxiety disorder 11/14/2014  . Alcohol use disorder, severe, dependence ([place]) 11/14/2014    Class: Chronic  . Other hammer toe (acquired) 05/06/2014  . Pronation deformity of ankle, acquired 05/06/2014  . Chronic cough 12/01/2013  . Essential hypertension, benign 11/13/2012  . Depression 11/13/2012  . GAD (generalized anxiety disorder) 11/13/2012  . GERD (gastroesophageal reflux disease) 11/13/2012  . Insomnia 11/13/2012  . Post-operative state 10/30/2012    Phillips Climes, PTA 05/30/2016, 12:03 PM  [facility] [place], [place], [address] Phone: [phone]   Fax:  [phone]  Name: [name] MRN: [id number] Date of Birth: Feb 03, 1961

## 2016-06-01 ENCOUNTER — Ambulatory Visit: Payer: BLUE CROSS/BLUE SHIELD | Admitting: Physical Therapy

## 2016-06-01 ENCOUNTER — Encounter: Payer: Self-pay | Admitting: Physical Therapy

## 2016-06-01 DIAGNOSIS — M25611 Stiffness of right shoulder, not elsewhere classified: Secondary | ICD-10-CM | POA: Diagnosis not present

## 2016-06-01 DIAGNOSIS — M25511 Pain in right shoulder: Secondary | ICD-10-CM | POA: Diagnosis not present

## 2016-06-01 NOTE — Therapy (Signed)
[facility] [place], [place], [address] Phone: [phone]   Fax:  [phone]  Physical Therapy Treatment  Patient Details  Name: [name] MRN: [id number] Date of Birth: June 12, 1961 Referring Provider: Iran Planas MD  Encounter Date: 06/01/2016      PT End of Session - 06/01/16 1048    Visit Number 8   Number of Visits 12   Date for PT Re-Evaluation 06/06/16   PT Start Time 1036   PT Stop Time 1124   PT Time Calculation (min) 48 min   Activity Tolerance Patient tolerated treatment well   Behavior During Therapy [facility] for tasks assessed/performed      Past Medical History:  Diagnosis Date  . Chest pain   . Depression   . GERD (gastroesophageal reflux disease)   . HTN (hypertension)   . Seizures ([place])     Past Surgical History:  Procedure Laterality Date  . FOOT SURGERY Right 9/10   right foot-bunion-hammertoe  . Lapidus fusion Left 08/02/12  . METATARSAL OSTEOTOMY WITH BUNIONECTOMY Left 08/02/12   McBride  . TUBAL LIGATION    . Tubes in ears      There were no vitals filed for this visit.      Subjective Assessment - 06/01/16 1046    Subjective Patient reports that she has to be back at work before Nov. 13th or has to go through a company review. Reports that she goes to her back MD tomorrow where he will give her pain medication.   Patient Stated Goals Get use of my right arm and return to work.   Currently in Pain? Yes   Pain Score 5    Pain Location Shoulder   Pain Orientation Right;Proximal   Pain Descriptors / Indicators Aching   Pain Type Acute pain   Pain Onset More than a month ago            [facility] PT Assessment - 06/01/16 0001      Assessment   Medical Diagnosis Right humeral fracture.   Hand Dominance Right                     [facility] Adult PT Treatment/Exercise - 06/01/16 0001      Shoulder Exercises: Supine   External Rotation AAROM;Right;20 reps   Flexion  AAROM;Both;20 reps     Shoulder Exercises: Pulleys   Flexion Other (comment)  x6 min   Other Pulley Exercises Seated RUE ranger flex/circles x7 min     Modalities   Modalities Electrical Stimulation;Moist Heat     Moist Heat Therapy   Number Minutes Moist Heat 15 Minutes   Moist Heat Location Shoulder     Electrical Stimulation   Electrical Stimulation Location R shoulder/ upper arm   Electrical Stimulation Action Pre-Mod   Electrical Stimulation Parameters 80-150 hz x15 min   Electrical Stimulation Goals Pain     Manual Therapy   Manual Therapy Passive ROM   Passive ROM PROM of R shoulder into flex/ER with gentle holds at end range                     PT Long Term Goals - 06/01/16 1120      PT LONG TERM GOAL #1   Title Ind with a HEP.   Time 4   Period Weeks   Status On-going     PT LONG TERM GOAL #2   Title Active right shoulder flexion to 145  degrees so the patient can easily reach overhead   Time 4   Period Weeks   Status On-going     PT LONG TERM GOAL #3   Title Active ER to 70 degrees+ to allow for easily donning/doffing of apparel   Time 4   Period Weeks   Status On-going     PT LONG TERM GOAL #4   Title Increase ROM so patient is able to reach behind back   Time 4   Period Weeks   Status On-going     PT LONG TERM GOAL #5   Title Increase right shoulder strength to a solid 4 to 4+/5 to increase stability for performance of functional activities   Time 4   Period Weeks   Status On-going     PT LONG TERM GOAL #6   Title Perform ADL's with right shoulder pain not > 3/10.   Time 4   Period Weeks   Status On-going               Plan - 06/01/16 1114    Clinical Impression Statement Patient presented in clinic with continued R shoulder achiness and stress regarding her husband and her job. Patient able to complete supine AAROM exercises fairly well although slowly secondary to discomfort as observed by facial grimacing. As PROM of  R shoulder began R shoulder joint observed as tight with firm end feel but as PROM progressed joint loosened with continued firm end feel with increased ROM. Normal modalities response noted following removal of the modalities. Home TENS unit was set up for patient per Mali Applegate, MPT preferences and patient was educated regarding use.    Rehab Potential Good   PT Frequency 3x / week   PT Duration 4 weeks   PT Treatment/Interventions ADLs/Self Care Home Management;Cryotherapy;Electrical Stimulation;Moist Heat;Therapeutic exercise;Therapeutic activities;Manual techniques;Passive range of motion   PT Next Visit Plan Continue with AAROM/ PROM of R shoulder with modalities PRN per MPT POC.   Consulted and Agree with Plan of Care Patient      Patient will benefit from skilled therapeutic intervention in order to improve the following deficits and impairments:  Decreased activity tolerance, Pain, Decreased range of motion  Visit Diagnosis: Acute pain of right shoulder  Stiffness of right shoulder, not elsewhere classified     Problem List Patient Active Problem List   Diagnosis Date Noted  . Hammer toe of right foot 07/08/2015  . Bunion 07/08/2015  . Major depressive disorder, single episode, severe without psychotic features ([place])   . Major depressive disorder, single episode, severe ([place]) 11/14/2014  . Generalized anxiety disorder 11/14/2014  . Alcohol use disorder, severe, dependence ([place]) 11/14/2014    Class: Chronic  . Other hammer toe (acquired) 05/06/2014  . Pronation deformity of ankle, acquired 05/06/2014  . Chronic cough 12/01/2013  . Essential hypertension, benign 11/13/2012  . Depression 11/13/2012  . GAD (generalized anxiety disorder) 11/13/2012  . GERD (gastroesophageal reflux disease) 11/13/2012  . Insomnia 11/13/2012  . Post-operative state 10/30/2012    Wynelle Fanny, PTA 06/01/2016, 11:29 AM  [facility] [address] [place], [place], [address] Phone: [phone]   Fax:  [phone]  Name: [name] MRN: [id number] Date of Birth: 03-16-1961

## 2016-06-02 DIAGNOSIS — G250 Essential tremor: Secondary | ICD-10-CM | POA: Diagnosis not present

## 2016-06-02 DIAGNOSIS — G43009 Migraine without aura, not intractable, without status migrainosus: Secondary | ICD-10-CM | POA: Diagnosis not present

## 2016-06-02 DIAGNOSIS — M542 Cervicalgia: Secondary | ICD-10-CM | POA: Diagnosis not present

## 2016-06-02 DIAGNOSIS — M5417 Radiculopathy, lumbosacral region: Secondary | ICD-10-CM | POA: Diagnosis not present

## 2016-06-06 ENCOUNTER — Ambulatory Visit: Payer: BLUE CROSS/BLUE SHIELD | Admitting: Physical Therapy

## 2016-06-06 ENCOUNTER — Encounter: Payer: Self-pay | Admitting: Physical Therapy

## 2016-06-06 DIAGNOSIS — M25511 Pain in right shoulder: Secondary | ICD-10-CM | POA: Diagnosis not present

## 2016-06-06 DIAGNOSIS — M25611 Stiffness of right shoulder, not elsewhere classified: Secondary | ICD-10-CM | POA: Diagnosis not present

## 2016-06-06 NOTE — Therapy (Signed)
[facility] [place], [place], [address] Phone: [phone]   Fax:  [phone]  Physical Therapy Treatment  Patient Details  Name: [name] MRN: [id number] Date of Birth: 26-Feb-1961 Referring Provider: Iran Planas MD  Encounter Date: 06/06/2016      PT End of Session - 06/06/16 1305    Visit Number 9   Number of Visits 12   Date for PT Re-Evaluation 06/06/16   PT Start Time [id number]   PT Stop Time 1158  Patient arrived late.   PT Time Calculation (min) 33 min   Activity Tolerance Patient tolerated treatment well   Behavior During Therapy WFL for tasks assessed/performed      Past Medical History:  Diagnosis Date  . Chest pain   . Depression   . GERD (gastroesophageal reflux disease)   . HTN (hypertension)   . Seizures ([place])     Past Surgical History:  Procedure Laterality Date  . FOOT SURGERY Right 9/10   right foot-bunion-hammertoe  . Lapidus fusion Left 08/02/12  . METATARSAL OSTEOTOMY WITH BUNIONECTOMY Left 08/02/12   McBride  . TUBAL LIGATION    . Tubes in ears      There were no vitals filed for this visit.      Subjective Assessment - 06/06/16 1306    Subjective I got an injection in my right shoulder and it helped a lot.   Patient Stated Goals Get use of my right arm and return to work.   Pain Score 3    Pain Location Shoulder   Pain Orientation Right;Proximal   Pain Type Acute pain   Pain Onset More than a month ago                         [facility] Adult PT Treatment/Exercise - 06/06/16 0001      Manual Therapy   Manual Therapy Passive ROM   Passive ROM Gentle PROM into right shoulder flexion and ER x 23 minutes with low load long duration stretching technique.                     PT Long Term Goals - 06/01/16 1120      PT LONG TERM GOAL #1   Title Ind with a HEP.   Time 4   Period Weeks   Status On-going     PT LONG TERM GOAL #2   Title Active  right shoulder flexion to 145 degrees so the patient can easily reach overhead   Time 4   Period Weeks   Status On-going     PT LONG TERM GOAL #3   Title Active ER to 70 degrees+ to allow for easily donning/doffing of apparel   Time 4   Period Weeks   Status On-going     PT LONG TERM GOAL #4   Title Increase ROM so patient is able to reach behind back   Time 4   Period Weeks   Status On-going     PT LONG TERM GOAL #5   Title Increase right shoulder strength to a solid 4 to 4+/5 to increase stability for performance of functional activities   Time 4   Period Weeks   Status On-going     PT LONG TERM GOAL #6   Title Perform ADL's with right shoulder pain not > 3/10.   Time 4   Period Weeks   Status On-going  Patient will benefit from skilled therapeutic intervention in order to improve the following deficits and impairments:  Decreased activity tolerance, Pain, Decreased range of motion  Visit Diagnosis: Acute pain of right shoulder  Stiffness of right shoulder, not elsewhere classified     Problem List Patient Active Problem List   Diagnosis Date Noted  . Hammer toe of right foot 07/08/2015  . Bunion 07/08/2015  . Major depressive disorder, single episode, severe without psychotic features ([place])   . Major depressive disorder, single episode, severe ([place]) 11/14/2014  . Generalized anxiety disorder 11/14/2014  . Alcohol use disorder, severe, dependence ([place]) 11/14/2014    Class: Chronic  . Other hammer toe (acquired) 05/06/2014  . Pronation deformity of ankle, acquired 05/06/2014  . Chronic cough 12/01/2013  . Essential hypertension, benign 11/13/2012  . Depression 11/13/2012  . GAD (generalized anxiety disorder) 11/13/2012  . GERD (gastroesophageal reflux disease) 11/13/2012  . Insomnia 11/13/2012  . Post-operative state 10/30/2012    [name], [name] 06/06/2016, 1:09 PM  [facility] [place], [place], [address] Phone: [phone]   Fax:  [phone]  Name: [name] MRN: [id number] Date of Birth: September 26, 1960

## 2016-06-08 ENCOUNTER — Encounter: Payer: Self-pay | Admitting: Physical Therapy

## 2016-06-09 ENCOUNTER — Ambulatory Visit: Payer: BLUE CROSS/BLUE SHIELD | Attending: Orthopedic Surgery | Admitting: *Deleted

## 2016-06-09 DIAGNOSIS — M25511 Pain in right shoulder: Secondary | ICD-10-CM | POA: Diagnosis not present

## 2016-06-09 DIAGNOSIS — M25611 Stiffness of right shoulder, not elsewhere classified: Secondary | ICD-10-CM | POA: Diagnosis not present

## 2016-06-09 NOTE — Therapy (Addendum)
[facility] [place], [place], [address] Phone: [phone]   Fax:  [phone]  Physical Therapy Treatment  Patient Details  Name: [name] MRN: [id number] Date of Birth: 28-Mar-1961 Referring Provider: Iran Planas MD  Encounter Date: 06/09/2016      PT End of Session - 06/09/16 1738    Date for PT Re-Evaluation 06/30/16      Past Medical History:  Diagnosis Date  . Chest pain   . Depression   . GERD (gastroesophageal reflux disease)   . HTN (hypertension)   . Seizures ([place])     Past Surgical History:  Procedure Laterality Date  . FOOT SURGERY Right 9/10   right foot-bunion-hammertoe  . Lapidus fusion Left 08/02/12  . METATARSAL OSTEOTOMY WITH BUNIONECTOMY Left 08/02/12   McBride  . TUBAL LIGATION    . Tubes in ears      There were no vitals filed for this visit.      Subjective Assessment - 06/09/16 1050    Subjective (P)  I got an injection in my right shoulder and it helped a lot.   Patient Stated Goals (P)  Get use of my right arm and return to work.   Currently in Pain? (P)  Yes                         [facility] Adult PT Treatment/Exercise - 06/09/16 0001      Modalities   Modalities Electrical Stimulation;Moist Heat     Moist Heat Therapy   Number Minutes Moist Heat 15 Minutes   Moist Heat Location Shoulder     Electrical Stimulation   Electrical Stimulation Location RT shldr   Electrical Stimulation Action Home TENS instructed in protocols x 15 mins   Electrical Stimulation Goals Pain     Manual Therapy   Manual Therapy Passive ROM   Passive ROM PROM / PAROM / AAROM of R shoulder into flex/ER/IR with gentle holds at end range                     PT Long Term Goals - 06/01/16 1120      PT LONG TERM GOAL #1   Title Ind with a HEP.   Time 4   Period Weeks   Status On-going     PT LONG TERM GOAL #2   Title Active right shoulder flexion to 145 degrees  so the patient can easily reach overhead   Time 4   Period Weeks   Status On-going     PT LONG TERM GOAL #3   Title Active ER to 70 degrees+ to allow for easily donning/doffing of apparel   Time 4   Period Weeks   Status On-going     PT LONG TERM GOAL #4   Title Increase ROM so patient is able to reach behind back   Time 4   Period Weeks   Status On-going     PT LONG TERM GOAL #5   Title Increase right shoulder strength to a solid 4 to 4+/5 to increase stability for performance of functional activities   Time 4   Period Weeks   Status On-going     PT LONG TERM GOAL #6   Title Perform ADL's with right shoulder pain not > 3/10.   Time 4   Period Weeks   Status On-going  Plan - 06/09/16 1124    Clinical Impression Statement Pt did fairly well with Rx today due to less pain from injection. She reports that she is not doing vey much at home due to her life is very busy trying to take care of her husband. Her ROM has improved  to Flexion 155 degrees and Er to 70 degrees   Rehab Potential Good   PT Frequency 3x / week   PT Duration 4 weeks   PT Treatment/Interventions ADLs/Self Care Home Management;Cryotherapy;Electrical Stimulation;Moist Heat;Therapeutic exercise;Therapeutic activities;Manual techniques;Passive range of motion   PT Next Visit Plan Continue with AAROM/ PROM of R shoulder with modalities PRN per MPT POC.   Consulted and Agree with Plan of Care Patient      Patient will benefit from skilled therapeutic intervention in order to improve the following deficits and impairments:  Decreased activity tolerance, Pain, Decreased range of motion  Visit Diagnosis: Acute pain of right shoulder  Stiffness of right shoulder, not elsewhere classified     Problem List Patient Active Problem List   Diagnosis Date Noted  . Hammer toe of right foot 07/08/2015  . Bunion 07/08/2015  . Major depressive disorder, single episode, severe without psychotic  features ([place])   . Major depressive disorder, single episode, severe ([place]) 11/14/2014  . Generalized anxiety disorder 11/14/2014  . Alcohol use disorder, severe, dependence ([place]) 11/14/2014    Class: Chronic  . Other hammer toe (acquired) 05/06/2014  . Pronation deformity of ankle, acquired 05/06/2014  . Chronic cough 12/01/2013  . Essential hypertension, benign 11/13/2012  . Depression 11/13/2012  . GAD (generalized anxiety disorder) 11/13/2012  . GERD (gastroesophageal reflux disease) 11/13/2012  . Insomnia 11/13/2012  . Post-operative state 10/30/2012    APPLEGATE, [place], PTA  06/09/2016, 5:39 PM Mali Applegate MPT [facility] [address] [place], [place], [address] Phone: [phone]   Fax:  [phone]  Name: PEGGYE POON MRN: [id number] Date of Birth: 06/24/1961  PHYSICAL THERAPY DISCHARGE SUMMARY  Visits from Start of Care:   Current functional level related to goals / functional outcomes: See above.   Remaining deficits: Continued right shoulder loss of ROM.   Education / Equipment: HEP.  Plan: Patient agrees to discharge.  Patient goals were not met. Patient is being discharged due to not returning since the last visit.  ?????        Mali Applegate MPT

## 2016-06-13 ENCOUNTER — Ambulatory Visit: Payer: BLUE CROSS/BLUE SHIELD | Admitting: Physical Therapy

## 2016-06-15 ENCOUNTER — Ambulatory Visit: Payer: BLUE CROSS/BLUE SHIELD | Admitting: Physical Therapy

## 2016-06-16 DIAGNOSIS — S42254D Nondisplaced fracture of greater tuberosity of right humerus, subsequent encounter for fracture with routine healing: Secondary | ICD-10-CM | POA: Diagnosis not present

## 2016-07-08 DIAGNOSIS — F1024 Alcohol dependence with alcohol-induced mood disorder: Secondary | ICD-10-CM

## 2016-07-08 HISTORY — DX: Alcohol dependence with alcohol-induced mood disorder: F10.24

## 2016-07-26 ENCOUNTER — Emergency Department (HOSPITAL_COMMUNITY)
Admission: EM | Admit: 2016-07-26 | Discharge: 2016-07-26 | Disposition: A | Discharge status: Home / Self Care | Payer: BLUE CROSS/BLUE SHIELD | Attending: Emergency Medicine | Admitting: Emergency Medicine

## 2016-07-26 ENCOUNTER — Encounter (HOSPITAL_COMMUNITY): Payer: Self-pay

## 2016-07-26 ENCOUNTER — Emergency Department (HOSPITAL_COMMUNITY): Payer: BLUE CROSS/BLUE SHIELD

## 2016-07-26 DIAGNOSIS — Z79899 Other long term (current) drug therapy: Secondary | ICD-10-CM | POA: Diagnosis not present

## 2016-07-26 DIAGNOSIS — T50904A Poisoning by unspecified drugs, medicaments and biological substances, undetermined, initial encounter: Secondary | ICD-10-CM | POA: Diagnosis not present

## 2016-07-26 DIAGNOSIS — R05 Cough: Secondary | ICD-10-CM | POA: Diagnosis not present

## 2016-07-26 DIAGNOSIS — R55 Syncope and collapse: Secondary | ICD-10-CM | POA: Diagnosis present

## 2016-07-26 DIAGNOSIS — F1990 Other psychoactive substance use, unspecified, uncomplicated: Secondary | ICD-10-CM | POA: Insufficient documentation

## 2016-07-26 DIAGNOSIS — I10 Essential (primary) hypertension: Secondary | ICD-10-CM | POA: Insufficient documentation

## 2016-07-26 DIAGNOSIS — F1910 Other psychoactive substance abuse, uncomplicated: Secondary | ICD-10-CM

## 2016-07-26 LAB — URINALYSIS, ROUTINE W REFLEX MICROSCOPIC
Bilirubin Urine: NEGATIVE
Glucose, UA: NEGATIVE mg/dL
Hgb urine dipstick: NEGATIVE
LEUKOCYTES UA: NEGATIVE
NITRITE: NEGATIVE
PROTEIN: NEGATIVE mg/dL
Specific Gravity, Urine: 1.025 (ref 1.005–1.030)
pH: 5.5 (ref 5.0–8.0)

## 2016-07-26 LAB — CBC WITH DIFFERENTIAL/PLATELET
BASOS ABS: 0 10*3/uL (ref 0.0–0.1)
Basophils Relative: 0 %
Eosinophils Absolute: 0 10*3/uL (ref 0.0–0.7)
Eosinophils Relative: 0 %
HEMATOCRIT: 47.2 % — AB (ref 36.0–46.0)
HEMOGLOBIN: 15.4 g/dL — AB (ref 12.0–15.0)
LYMPHS PCT: 18 %
Lymphs Abs: 1.8 10*3/uL (ref 0.7–4.0)
MCH: 30.1 pg (ref 26.0–34.0)
MCHC: 32.6 g/dL (ref 30.0–36.0)
MCV: 92.4 fL (ref 78.0–100.0)
MONOS PCT: 7 %
Monocytes Absolute: 0.6 10*3/uL (ref 0.1–1.0)
NEUTROS ABS: 7.1 10*3/uL (ref 1.7–7.7)
NEUTROS PCT: 75 %
Platelets: 222 10*3/uL (ref 150–400)
RBC: 5.11 MIL/uL (ref 3.87–5.11)
RDW: 14.5 % (ref 11.5–15.5)
WBC: 9.6 10*3/uL (ref 4.0–10.5)

## 2016-07-26 LAB — BASIC METABOLIC PANEL
ANION GAP: 13 (ref 5–15)
BUN: 16 mg/dL (ref 6–20)
CHLORIDE: 104 mmol/L (ref 101–111)
CO2: 24 mmol/L (ref 22–32)
Calcium: 9 mg/dL (ref 8.9–10.3)
Creatinine, Ser: 0.47 mg/dL (ref 0.44–1.00)
GFR calc non Af Amer: >60 mL/min (ref >60)
GLUCOSE: 82 mg/dL (ref 65–99)
Potassium: 3.5 mmol/L (ref 3.5–5.1)
Sodium: 141 mmol/L (ref 135–145)

## 2016-07-26 LAB — TROPONIN I: TROPONIN I: 0.04 ng/mL — AB (ref <0.03)

## 2016-07-26 LAB — RAPID URINE DRUG SCREEN, HOSP PERFORMED
Amphetamines: NOT DETECTED
BENZODIAZEPINES: POSITIVE — AB
Barbiturates: NOT DETECTED
COCAINE: NOT DETECTED
OPIATES: POSITIVE — AB
Tetrahydrocannabinol: NOT DETECTED

## 2016-07-26 LAB — ETHANOL: ALCOHOL ETHYL (B): 91 mg/dL — AB (ref <5)

## 2016-07-26 LAB — CBG MONITORING, ED: Glucose-Capillary: 73 mg/dL (ref 65–99)

## 2016-07-26 MED ORDER — SODIUM CHLORIDE 0.9 % IV BOLUS (SEPSIS)
1000.0000 mL | Freq: Once | INTRAVENOUS | Status: AC
  Administered 2016-07-26: 1000 mL via INTRAVENOUS

## 2016-07-26 NOTE — ED Notes (Signed)
Pt reports having near syncopal episode at work today. Pt reports having cold sx x 4 days. Pt also reports drinking 3 oz liqour last night, taking klonopin and otc dayquil this am. Pt states she feels generally weak and tired. Denies any numbness/tingling/sob/cp. nad noted.

## 2016-07-26 NOTE — ED Triage Notes (Signed)
Pt reports she has had a cold recently and has been taking nyquil.  Reports had 1 drink last night and took her regular dose of klonopin this morning before work.  PT says was standing at work and felt like she was going to pass out.  Pt unsure if she went completely out but says her coworker kept her from falling.  Pt presently denies any complaints.

## 2016-07-26 NOTE — ED Provider Notes (Signed)
[facility] DEPT Provider Note   CSN: [id number] Arrival date & time: 07/26/16  1445     History   Chief Complaint Chief Complaint  Patient presents with  . Near Syncope    HPI [name] is a 55 y.o. female.  Pt presents to the ED today after having a near syncopal episode at work today.  She said that she has had a cough and cold for the past few days.  She took Nyquil last night in addition to some alcohol last night.  The pt took her regular Klonopin today.  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  She has felt tired all day, and did not eat or drink much.  She told her co-worker that she felt like she was going to pass out and co-worker kept her from falling.  Pt denies any pain.  She feels tired.      Past Medical History:  Diagnosis Date  . Chest pain   . Depression   . GERD (gastroesophageal reflux disease)   . HTN (hypertension)   . Seizures [facility])     Patient Active Problem List   Diagnosis Date Noted  . Hammer toe of right foot 07/08/2015  . Bunion 07/08/2015  . Major depressive disorder, single episode, severe without psychotic features ([place])   . Major depressive disorder, single episode, severe ([place]) 11/14/2014  . Generalized anxiety disorder 11/14/2014  . Alcohol use disorder, severe, dependence ([place]) 11/14/2014    Class: Chronic  . Other hammer toe (acquired) 05/06/2014  . Pronation deformity of ankle, acquired 05/06/2014  . Chronic cough 12/01/2013  . Essential hypertension, benign 11/13/2012  . Depression 11/13/2012  . GAD (generalized anxiety disorder) 11/13/2012  . GERD (gastroesophageal reflux disease) 11/13/2012  . Insomnia 11/13/2012  . Post-operative state 10/30/2012    Past Surgical History:  Procedure Laterality Date  . FOOT SURGERY Right 9/10   right foot-bunion-hammertoe  . Lapidus fusion Left 08/02/12  . METATARSAL OSTEOTOMY WITH BUNIONECTOMY Left 08/02/12   McBride  . TUBAL LIGATION    . Tubes in ears      OB  History    No data available       Home Medications    Prior to Admission medications   Medication Sig Start Date End Date Taking? Authorizing Provider  clonazePAM (KLONOPIN) 0.5 MG tablet Take 0.5 mg by mouth. Take one to two tablets three times a day as needed.  Patient reported.   Dr Toy Care, psychiatry, prescriber    Historical Provider, MD  escitalopram (LEXAPRO) 20 MG tablet Take 20 mg by mouth daily.    Historical Provider, MD  esomeprazole (NEXIUM) 20 MG capsule TAKE 1 CAPSULE BY MOUTH  DAILY AT 12 NOON. 04/04/16   Sharion Balloon, FNP  hydrOXYzine (ATARAX/VISTARIL) 50 MG tablet Take 1 tablet (50 mg total) by mouth 3 (three) times daily as needed. 01/20/16   Fransisca Kaufmann Dettinger, MD  mirtazapine (REMERON) 30 MG tablet Take 30 mg by mouth at bedtime.    Historical Provider, MD  Multiple Vitamin (MULTI VITAMIN PO) Take 1 tablet by mouth daily.     Historical Provider, MD  oxyCODONE-acetaminophen (ROXICET) 5-325 MG tablet Take 1 tablet by mouth every 6 (six) hours as needed for moderate pain or severe pain. 11/17/15   Forde Dandy, MD    Family History Family History  Problem Relation Age of Onset  . Hypertension Mother   . Cancer Mother     kidney  .  Diabetes Father   . Hypertension Father     Social History Social History  Substance Use Topics  . Smoking status: Never Smoker  . Smokeless tobacco: Never Used  . Alcohol use 0.0 oz/week     Comment: daily     Allergies   Amlodipine and Cortisporin [neomycin-polymyxin-hc]   Review of Systems Review of Systems  Constitutional: Positive for fatigue.  Respiratory: Positive for cough.   All other systems reviewed and are negative.    Physical Exam Updated Vital Signs BP 113/76   Pulse 109   Temp 99 F (37.2 C) (Oral)   Resp 15   Ht 4\' 11"  ([id number] m)   Wt 102 lb (46.3 kg)   LMP 08/09/2013   SpO2 97%   BMI 20.60 kg/m   Physical Exam  Constitutional: She is oriented to person, place, and time. She appears  well-developed and well-nourished.  HENT:  Head: Normocephalic and atraumatic.  Right Ear: External ear normal.  Left Ear: External ear normal.  Nose: Nose normal.  Mouth/Throat: Mucous membranes are dry.  Eyes: Conjunctivae and EOM are normal. Pupils are equal, round, and reactive to light.  Neck: Normal range of motion. Neck supple.  Cardiovascular: Regular rhythm, normal heart sounds and intact distal pulses.  Tachycardia present.   Pulmonary/Chest: Effort normal and breath sounds normal.  Abdominal: Soft. Bowel sounds are normal.  Musculoskeletal: Normal range of motion.  Neurological: She is alert and oriented to person, place, and time.  Skin: Skin is warm and dry.  Psychiatric: She has a normal mood and affect. Her behavior is normal. Judgment and thought content normal.  Nursing note and vitals reviewed.    ED Treatments / Results  Labs (all labs ordered are listed, but only abnormal results are displayed) Labs Reviewed  CBC WITH DIFFERENTIAL/PLATELET - Abnormal; Notable for the following:       Result Value   Hemoglobin 15.4 (*)    HCT 47.2 (*)    All other components within normal limits  ETHANOL - Abnormal; Notable for the following:    Alcohol, Ethyl (B) 91 (*)    All other components within normal limits  TROPONIN I - Abnormal; Notable for the following:    Troponin I 0.04 (*)    All other components within normal limits  URINALYSIS, ROUTINE W REFLEX MICROSCOPIC - Abnormal; Notable for the following:    Color, Urine AMBER (*)    Ketones, ur TRACE (*)    All other components within normal limits  RAPID URINE DRUG SCREEN, HOSP PERFORMED - Abnormal; Notable for the following:    Opiates POSITIVE (*)    Benzodiazepines POSITIVE (*)    All other components within normal limits  BASIC METABOLIC PANEL  CBG MONITORING, ED    EKG  EKG Interpretation  Date/Time:  Tuesday July 26 2016 15:17:54 EST Ventricular Rate:  112 PR Interval:    QRS Duration: 74 QT  Interval:  349 QTC Calculation: 477 R Axis:   -59 Text Interpretation:  Sinus tachycardia LAD, consider left anterior fascicular block Confirmed by Oaklyn Mans MD, Finola Rosal ([id number]) on 07/26/2016 3:31:58 PM       Radiology Dg Chest 2 View  Result Date: 07/26/2016 CLINICAL DATA:  Syncope at work. Four days of cough and cold symptoms. Nonsmoker. History of hypertension. EXAM: CHEST  2 VIEW COMPARISON:  Chest x-ray dated November 06, 2013 FINDINGS: The lungs are well-expanded. There is no focal infiltrate. There is no pleural effusion. The heart and pulmonary vascularity are  normal. There is calcification in the wall of the aortic arch. There are prominent right sided osteophytes in the mid thoracic spine. There is no compression fracture. IMPRESSION: There is no acute cardiopulmonary abnormality. Thoracic aortic atherosclerosis. Electronically Signed   By: David  Martinique M.D.   On: 07/26/2016 15:42    Procedures Procedures (including critical care time)  Medications Ordered in ED Medications  sodium chloride 0.9 % bolus 1,000 mL (0 mLs Intravenous Stopped 07/26/16 1645)     Initial Impression / Assessment and Plan / ED Course  I have reviewed the triage vital signs and the nursing notes.  Pertinent labs & imaging results that were available during my care of the patient were reviewed by me and considered in my medical decision making (see chart for details).  Clinical Course    Pt looks good now after IVFs.  Pt has a combination of alcohol, benzos, and opiates on board which is likely what caused pt to pass out.  She is encouraged not to combine those medications.  She knows to return if worse.  Final Clinical Impressions(s) / ED Diagnoses   Final diagnoses:  Near syncope  Polysubstance abuse    New Prescriptions New Prescriptions   No medications on file     Isla Pence, MD 07/26/16 1731

## 2016-07-26 NOTE — ED Triage Notes (Signed)
Per EMS.  Pt was up drinking till 2 am and got up to go to work this morning and took a klonopin.  After she arrived to work she took Nyquil.  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  [place] [employer] called EMS due to pt being unable to drive home.

## 2016-07-28 DIAGNOSIS — F4321 Adjustment disorder with depressed mood: Secondary | ICD-10-CM | POA: Diagnosis not present

## 2016-08-04 ENCOUNTER — Ambulatory Visit (HOSPITAL_COMMUNITY): Payer: BLUE CROSS/BLUE SHIELD | Admitting: Psychology

## 2016-08-04 DIAGNOSIS — F1020 Alcohol dependence, uncomplicated: Secondary | ICD-10-CM

## 2016-08-05 ENCOUNTER — Encounter (HOSPITAL_COMMUNITY): Payer: Self-pay | Admitting: Psychology
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
## 2016-08-10 ENCOUNTER — Other Ambulatory Visit (HOSPITAL_COMMUNITY): Payer: BLUE CROSS/BLUE SHIELD | Attending: Psychiatry | Admitting: Psychology

## 2016-08-10 DIAGNOSIS — Z62810 Personal history of physical and sexual abuse in childhood: Secondary | ICD-10-CM | POA: Insufficient documentation

## 2016-08-10 DIAGNOSIS — I10 Essential (primary) hypertension: Secondary | ICD-10-CM | POA: Diagnosis not present

## 2016-08-10 DIAGNOSIS — F419 Anxiety disorder, unspecified: Secondary | ICD-10-CM | POA: Insufficient documentation

## 2016-08-10 DIAGNOSIS — F1910 Other psychoactive substance abuse, uncomplicated: Secondary | ICD-10-CM | POA: Diagnosis not present

## 2016-08-10 DIAGNOSIS — F418 Other specified anxiety disorders: Secondary | ICD-10-CM | POA: Insufficient documentation

## 2016-08-10 DIAGNOSIS — F329 Major depressive disorder, single episode, unspecified: Secondary | ICD-10-CM | POA: Insufficient documentation

## 2016-08-10 DIAGNOSIS — F1020 Alcohol dependence, uncomplicated: Secondary | ICD-10-CM

## 2016-08-10 DIAGNOSIS — K219 Gastro-esophageal reflux disease without esophagitis: Secondary | ICD-10-CM | POA: Insufficient documentation

## 2016-08-10 DIAGNOSIS — F609 Personality disorder, unspecified: Secondary | ICD-10-CM | POA: Diagnosis not present

## 2016-08-10 DIAGNOSIS — F1021 Alcohol dependence, in remission: Secondary | ICD-10-CM | POA: Diagnosis not present

## 2016-08-10 DIAGNOSIS — S42254D Nondisplaced fracture of greater tuberosity of right humerus, subsequent encounter for fracture with routine healing: Secondary | ICD-10-CM | POA: Diagnosis not present

## 2016-08-10 DIAGNOSIS — R05 Cough: Secondary | ICD-10-CM | POA: Insufficient documentation

## 2016-08-10 DIAGNOSIS — S42291S Other displaced fracture of upper end of right humerus, sequela: Secondary | ICD-10-CM | POA: Insufficient documentation

## 2016-08-10 DIAGNOSIS — R55 Syncope and collapse: Secondary | ICD-10-CM | POA: Diagnosis not present

## 2016-08-10 DIAGNOSIS — Z6372 Alcoholism and drug addiction in family: Secondary | ICD-10-CM | POA: Diagnosis not present

## 2016-08-10 DIAGNOSIS — F4310 Post-traumatic stress disorder, unspecified: Secondary | ICD-10-CM | POA: Insufficient documentation

## 2016-08-10 DIAGNOSIS — R5383 Other fatigue: Secondary | ICD-10-CM | POA: Diagnosis not present

## 2016-08-11 ENCOUNTER — Other Ambulatory Visit (HOSPITAL_COMMUNITY): Payer: BLUE CROSS/BLUE SHIELD | Admitting: Psychology

## 2016-08-11 DIAGNOSIS — F1910 Other psychoactive substance abuse, uncomplicated: Secondary | ICD-10-CM | POA: Diagnosis not present

## 2016-08-11 DIAGNOSIS — F331 Major depressive disorder, recurrent, moderate: Secondary | ICD-10-CM

## 2016-08-11 DIAGNOSIS — F1020 Alcohol dependence, uncomplicated: Secondary | ICD-10-CM

## 2016-08-14 ENCOUNTER — Encounter (HOSPITAL_COMMUNITY): Payer: Self-pay | Admitting: Psychology

## 2016-08-14 NOTE — Progress Notes (Signed)
    Daily Group Progress Note  Program: CD-IOP   08/14/2016 [name] [id number]  Diagnosis:  No diagnosis found.   Sobriety Date: 08/10/16  Group Time: 1-3:30 pm  Participation Level: Minimal  Behavioral Response: Sharing  Type of Therapy: Process Group  Interventions: Supportive  Topic: Process: The first half of group was spent in process. Members shared about the long holiday weekend and the challenges and temptations they presented themselves. They also identified what they had done over the weekend to support and strengthen their recovery. Drug tests were collected from everyone and the program director met one member for discharge today and another member for her initial visit. A new group member was present.   Group Time: 3:30-4 pm  Participation Level: Minimal  Behavioral Response: Appropriate and Sharing  Type of Therapy: Process Group  Interventions: Supportive  Topic: Process/Graduation: The second half of group was much shorter today due to the lengthy time spent in the first half. There was much to be discussed since we had last met 6 days ago. The new group member introduced herself. The counselor assisted her in sharing why she was here. This new member received a warm welcome from the group and invitation to join one of the women tomorrow morning for an AA meeting. A graduation ceremony was held to honor a member who was successfully completing the program today. Brownies, the graduation medallion and words of admiration and hope were shared with her as she leaves the program.   Summary: The patient was new to the group and was very quiet, but attentive for much of the session. She received a warm welcome from her new fellow group members. When asked to introduce herself to the group, the patient needed some assistance from the counselor in disclosing the details of her story. The patient admitted she had never been in any sort of group like this and was  nervous about being here. She finally explained how she had been taken by EMS to the hospital and was later told by the EAP at her job that she would need to seek counseling before coming back to work. The patient described a very stressful situation at home where her husband is out of work and they have only her income at this time. He has been out of work for two years and she reported he gets angry with her and is very critical of her. The patient appeared more comfortable as the session neared and she shared words of hope and encouragement to the graduating member. She responded well to this first group session and we will continue to work with her in the days and weeks ahead.   UDS collected: Yes Results: positive for benzodiazepines and alcohol  AA/NA attended?: No, but she is new to recovery and this was her first group session  Sponsor?: No   Brandon Melnick, LCAS 08/14/2016 2:51 PM

## 2016-08-15 ENCOUNTER — Encounter (HOSPITAL_COMMUNITY): Payer: Self-pay | Admitting: Medical

## 2016-08-15 ENCOUNTER — Other Ambulatory Visit (HOSPITAL_COMMUNITY): Payer: Self-pay

## 2016-08-15 ENCOUNTER — Other Ambulatory Visit (INDEPENDENT_AMBULATORY_CARE_PROVIDER_SITE_OTHER): Payer: BLUE CROSS/BLUE SHIELD | Admitting: Psychology

## 2016-08-15 VITALS — BP 125/85 | HR 88 | Ht 59.0 in | Wt 102.0 lb

## 2016-08-15 DIAGNOSIS — Z8659 Personal history of other mental and behavioral disorders: Secondary | ICD-10-CM | POA: Diagnosis not present

## 2016-08-15 DIAGNOSIS — Z62810 Personal history of physical and sexual abuse in childhood: Secondary | ICD-10-CM | POA: Diagnosis not present

## 2016-08-15 DIAGNOSIS — T7432XA Child psychological abuse, confirmed, initial encounter: Secondary | ICD-10-CM | POA: Insufficient documentation

## 2016-08-15 DIAGNOSIS — F1910 Other psychoactive substance abuse, uncomplicated: Secondary | ICD-10-CM | POA: Diagnosis not present

## 2016-08-15 DIAGNOSIS — F329 Major depressive disorder, single episode, unspecified: Secondary | ICD-10-CM

## 2016-08-15 DIAGNOSIS — F609 Personality disorder, unspecified: Secondary | ICD-10-CM

## 2016-08-15 DIAGNOSIS — I10 Essential (primary) hypertension: Secondary | ICD-10-CM

## 2016-08-15 DIAGNOSIS — F1021 Alcohol dependence, in remission: Secondary | ICD-10-CM | POA: Diagnosis not present

## 2016-08-15 DIAGNOSIS — Z811 Family history of alcohol abuse and dependence: Secondary | ICD-10-CM

## 2016-08-15 DIAGNOSIS — S42293A Other displaced fracture of upper end of unspecified humerus, initial encounter for closed fracture: Secondary | ICD-10-CM | POA: Insufficient documentation

## 2016-08-15 DIAGNOSIS — F419 Anxiety disorder, unspecified: Secondary | ICD-10-CM

## 2016-08-15 DIAGNOSIS — Z62811 Personal history of psychological abuse in childhood: Secondary | ICD-10-CM

## 2016-08-15 DIAGNOSIS — T7432XS Child psychological abuse, confirmed, sequela: Secondary | ICD-10-CM

## 2016-08-15 DIAGNOSIS — Z6372 Alcoholism and drug addiction in family: Secondary | ICD-10-CM

## 2016-08-15 DIAGNOSIS — S42291S Other displaced fracture of upper end of right humerus, sequela: Secondary | ICD-10-CM

## 2016-08-15 DIAGNOSIS — K219 Gastro-esophageal reflux disease without esophagitis: Secondary | ICD-10-CM

## 2016-08-15 MED ORDER — BUSPIRONE HCL 5 MG PO TABS: ORAL_TABLET | ORAL | 0 refills | Status: DC

## 2016-08-15 MED ORDER — HYDROXYZINE PAMOATE 25 MG PO CAPS: 25.0000 mg | ORAL_CAPSULE | Freq: Three times a day (TID) | ORAL | 0 refills | Status: DC | PRN

## 2016-08-15 MED ORDER — HYDROXYZINE HCL 25 MG PO TABS: ORAL_TABLET | ORAL | 1 refills | Status: DC

## 2016-08-15 MED ORDER — BUSPIRONE HCL 5 MG PO TABS: ORAL_TABLET | ORAL | 1 refills | Status: DC
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
## 2016-08-16 NOTE — Progress Notes (Signed)
    Daily Group Progress Note  Program: CD-IOP   08/16/2016 [name] [id number]  Diagnosis:  Alcohol use disorder, severe, dependence (HCC)  Moderate episode of recurrent major depressive disorder ([place])   Sobriety Date: 08/10/16  Group Time: 1-2:30  Participation Level: Active  Behavioral Response: Appropriate and Sharing  Type of Therapy: Process Group  Interventions: Motivational Interviewing  Topic: Counselors met with patients for 1.5 hour group process in which pts checked in about their sobriety date, recovery related behaviors and actions, meeting attendance, medications, activities of daily living, and challenges to their sobriety. One member completed successfully and participated in a completion ceremony.   Group Time: 2:30-4  Participation Level: Active  Behavioral Response: Appropriate and Sharing  Type of Therapy: Psycho-education Group  Interventions: Other: powerlessness and 12 steps  Topic: Counselors met with patients for 1.5 hour group psychoeducation session in which pts discussed the 12 Step idea of "powerlessness" and the difference between it and weakness. Counselors discussed cultural implications and empowered pts to see "victory through surrender". Patients were agreeable and discussed the topic at length as it related to their own personal lives.    Summary: Patient was active, engaged, and shared openly in the group when asked. She stated she is "not used to sharing so much" but presented as calm and coherent when she was sharing. Counselor noticed some slight tremors in her hands, possibly due to nerves or withdrawing from alcohol. Patient stated she attended her first [place] meeting last night and it was 'very enjoyable". She reported getting phone numbers and making connections with other women in recovery. She also reported getting a sponsor who lives in her hometown. She presented as excited when discussing her entry into the 12 step program.  She shared details about her frustration with her husband, who continues to drink. [name], LPCA   UDS collected: No Results: negative  AA/NA attended?: YesWednesday  Sponsor?: Yes   [name], LCAS 08/16/2016 11:25 AM

## 2016-08-17 ENCOUNTER — Other Ambulatory Visit (HOSPITAL_COMMUNITY): Payer: BLUE CROSS/BLUE SHIELD | Admitting: Psychology

## 2016-08-17 DIAGNOSIS — F419 Anxiety disorder, unspecified: Secondary | ICD-10-CM

## 2016-08-17 DIAGNOSIS — F1021 Alcohol dependence, in remission: Secondary | ICD-10-CM

## 2016-08-17 DIAGNOSIS — F1910 Other psychoactive substance abuse, uncomplicated: Secondary | ICD-10-CM | POA: Diagnosis not present

## 2016-08-17 DIAGNOSIS — Z8659 Personal history of other mental and behavioral disorders: Secondary | ICD-10-CM

## 2016-08-17 DIAGNOSIS — F329 Major depressive disorder, single episode, unspecified: Secondary | ICD-10-CM

## 2016-08-18 ENCOUNTER — Telehealth (HOSPITAL_COMMUNITY): Payer: Self-pay | Admitting: Psychology

## 2016-08-18 ENCOUNTER — Other Ambulatory Visit (HOSPITAL_COMMUNITY): Payer: BLUE CROSS/BLUE SHIELD | Admitting: Psychology

## 2016-08-18 DIAGNOSIS — F1021 Alcohol dependence, in remission: Secondary | ICD-10-CM

## 2016-08-18 DIAGNOSIS — F1910 Other psychoactive substance abuse, uncomplicated: Secondary | ICD-10-CM | POA: Diagnosis not present

## 2016-08-19 NOTE — Progress Notes (Signed)
Daily Group Progress Note  Program: CD-IOP   08/19/2016 Faithanne Verret Copenhaver [id number]  Diagnosis:  Alcohol use disorder, severe, in early remission, dependence ([place])  History of posttraumatic stress disorder (PTSD)  Major depression, chronic  Chronic anxiety   Sobriety Date: 08/11/15  Group Time: 1-2:30pm  Participation Level: Active  Behavioral Response: Appropriate and Sharing  Type of Therapy: Process Group  Interventions: CBT  Topic: Counselors met with patients for 1.5 hour group process session. Topics included recovery from mind altering drugs and alcohol, sobriety, medication management, acl's, and 12 step facilitation. One new patient was present and appeared attentive and met with Investment banker, operational. One pt was missing due to unexcused absence.      Group Time: 2:30-4pm  Participation Level: Minimal  Behavioral Response: Appropriate  Type of Therapy: Psycho-education Group  Interventions: Other: gestalt theory and chaplain  Topic: Counselors and guest chaplain met with patients for 1.5 hour group psychoeducation session. Topics included Gestalt theory, immediate processing, and recovery from mind altering drugs and alcohol. Chaplain led 10 minute "emotion check-in" and had each group member participate in a reading/ experiential activity. Patients were very attentive and engaged for session.    Summary: Patient was euthymic, active and engaged during process session but more reserved during psychoeducation and chaplain-led portion of group. She reported attending 1 AA meeting. She stated she had secured a new sponsor in [place] since her old sponsor was "already flaking out" on their meetings. Patient shared extensively on her worries about what other people in her community think of her and her addiction problem. [place] but also invades her privacy". Patient expressed interest in a new job but verbalized hopelessness about finding  a new job since she has been in the same position for 30 years. Other group members encouraged her to research employment opportunities online, which pt agreed to. Patient asked counselor and program director to clear her for taking Methocarbomol, a muscle relaxer, which program director stated is appropriate for CD-IOP. Patient stated her husband is still supportive of her sobriety and he is attempting sobriety himself currently. Patient denied cravings for alcohol or drugs. Youlanda Roys, LPCA   UDS collected: Yes Results: negative  AA/NA attended?: YesWednesday  Sponsor?: Yes   Brandon Melnick, LCAS 08/19/2016 10:45 AM

## 2016-08-20 ENCOUNTER — Encounter (HOSPITAL_COMMUNITY): Payer: Self-pay | Admitting: Psychology

## 2016-08-20 NOTE — Progress Notes (Addendum)
    Daily Group Progress Note  Program: CD-IOP   08/20/2016 [name] [id number]  Diagnosis: F10.20  Sobriety Date: 08/10/16  Group Time: 1-2:30pm  Participation Level: Active  Behavioral Response: Appropriate and Sharing  Type of Therapy: Process Group  Interventions: Supportive  Topic: Process: Counselors met with patients to discuss the activities they had engaged in to support their recovery. A new group member introduced himself to the group and shared his reasons for pursuing treatment. All members were engaged in the discussion concerning recovery from mind-altering drugs and alcohol.  Group Time: 2:30-4pm  Participation Level: Active  Behavioral Response: Sharing  Type of Therapy: Psycho-education Group  Interventions: Strength-based  Topic: Psychoeducation: Counselors led a discussion regarding the serenity prayer and how the idea of acceptance applies to recovery. Patients listed five things they could and could not change and shared them with the group. A patient successfully graduated from the group. Drug tests were collected from several group members.   Summary: The patient presented as active and engaged in group. She reports meeting with her sponsor and beginning to work through the first step. She has begun reading the daily reflections and creating a schedule for herself. She shared that her husband will be having surgery, and this is causing her some distress. It will require her to do more to help him and she already has little time for herself. During the discussion of the serenity prayer she shared more about her relationship with her mother and how it impacted her negatively. Several group members gave helpful feedback regarding her experience. The patient attended two [place] meetings since we last met. She seems to be growing more comfortable in group as evidenced by the more revealing and intense nature of her disclosures.     UDS collected: No Results:    AA/NA attended?: YesWednesday and Thursday  Sponsor?: Yes   Brandon Melnick, LCAS 08/20/2016 2:25 PM

## 2016-08-21 ENCOUNTER — Other Ambulatory Visit: Payer: Self-pay | Admitting: Family

## 2016-08-22 ENCOUNTER — Other Ambulatory Visit (HOSPITAL_COMMUNITY): Payer: Self-pay | Admitting: Psychology

## 2016-08-22 ENCOUNTER — Encounter (HOSPITAL_COMMUNITY): Payer: Self-pay | Admitting: Medical

## 2016-08-22 ENCOUNTER — Other Ambulatory Visit (HOSPITAL_COMMUNITY): Payer: BLUE CROSS/BLUE SHIELD | Admitting: Psychology

## 2016-08-22 ENCOUNTER — Other Ambulatory Visit (HOSPITAL_COMMUNITY): Payer: Self-pay | Admitting: Medical

## 2016-08-22 DIAGNOSIS — F1021 Alcohol dependence, in remission: Secondary | ICD-10-CM

## 2016-08-22 DIAGNOSIS — F322 Major depressive disorder, single episode, severe without psychotic features: Secondary | ICD-10-CM

## 2016-08-22 DIAGNOSIS — F1024 Alcohol dependence with alcohol-induced mood disorder: Secondary | ICD-10-CM

## 2016-08-22 DIAGNOSIS — F411 Generalized anxiety disorder: Secondary | ICD-10-CM

## 2016-08-22 DIAGNOSIS — F1910 Other psychoactive substance abuse, uncomplicated: Secondary | ICD-10-CM | POA: Diagnosis not present

## 2016-08-22 MED ORDER — BUSPIRONE HCL 5 MG PO TABS: ORAL_TABLET | ORAL | 0 refills | Status: DC

## 2016-08-22 MED ORDER — HYDROXYZINE HCL 25 MG PO TABS: ORAL_TABLET | ORAL | 1 refills | Status: DC

## 2016-08-22 MED ORDER — HYDROXYZINE PAMOATE 25 MG PO CAPS: 25.0000 mg | ORAL_CAPSULE | Freq: Three times a day (TID) | ORAL | 0 refills | Status: DC | PRN

## 2016-08-22 MED ORDER — BUSPIRONE HCL 5 MG PO TABS: ORAL_TABLET | ORAL | 1 refills | Status: DC

## 2016-08-22 NOTE — Progress Notes (Signed)
Pt states rx did not get to [facility] says they did.Reordered today
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
## 2016-08-24 ENCOUNTER — Other Ambulatory Visit (HOSPITAL_COMMUNITY): Payer: BLUE CROSS/BLUE SHIELD

## 2016-08-25 ENCOUNTER — Encounter (HOSPITAL_COMMUNITY): Payer: Self-pay | Admitting: Psychology

## 2016-08-25 ENCOUNTER — Other Ambulatory Visit (HOSPITAL_COMMUNITY): Payer: BLUE CROSS/BLUE SHIELD
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
## 2016-08-26 DIAGNOSIS — F331 Major depressive disorder, recurrent, moderate: Secondary | ICD-10-CM | POA: Diagnosis not present

## 2016-08-26 DIAGNOSIS — F410 Panic disorder [episodic paroxysmal anxiety] without agoraphobia: Secondary | ICD-10-CM | POA: Diagnosis not present

## 2016-08-29 ENCOUNTER — Other Ambulatory Visit (HOSPITAL_COMMUNITY): Payer: BLUE CROSS/BLUE SHIELD | Admitting: Psychology

## 2016-08-29 DIAGNOSIS — F1021 Alcohol dependence, in remission: Secondary | ICD-10-CM

## 2016-08-29 DIAGNOSIS — F1910 Other psychoactive substance abuse, uncomplicated: Secondary | ICD-10-CM | POA: Diagnosis not present

## 2016-08-30 ENCOUNTER — Ambulatory Visit: Payer: Self-pay | Admitting: Physical Therapy

## 2016-08-31 ENCOUNTER — Other Ambulatory Visit (INDEPENDENT_AMBULATORY_CARE_PROVIDER_SITE_OTHER): Payer: BLUE CROSS/BLUE SHIELD | Admitting: Psychology

## 2016-08-31 ENCOUNTER — Encounter (HOSPITAL_COMMUNITY): Payer: Self-pay | Admitting: Medical

## 2016-08-31 DIAGNOSIS — Z811 Family history of alcohol abuse and dependence: Secondary | ICD-10-CM

## 2016-08-31 DIAGNOSIS — F609 Personality disorder, unspecified: Secondary | ICD-10-CM

## 2016-08-31 DIAGNOSIS — F1021 Alcohol dependence, in remission: Secondary | ICD-10-CM

## 2016-08-31 DIAGNOSIS — Z6372 Alcoholism and drug addiction in family: Secondary | ICD-10-CM

## 2016-08-31 DIAGNOSIS — F329 Major depressive disorder, single episode, unspecified: Secondary | ICD-10-CM

## 2016-08-31 DIAGNOSIS — Z62810 Personal history of physical and sexual abuse in childhood: Secondary | ICD-10-CM

## 2016-08-31 DIAGNOSIS — Z8659 Personal history of other mental and behavioral disorders: Secondary | ICD-10-CM | POA: Diagnosis not present

## 2016-08-31 DIAGNOSIS — F419 Anxiety disorder, unspecified: Secondary | ICD-10-CM

## 2016-08-31 DIAGNOSIS — T7432XS Child psychological abuse, confirmed, sequela: Secondary | ICD-10-CM

## 2016-08-31 DIAGNOSIS — F1910 Other psychoactive substance abuse, uncomplicated: Secondary | ICD-10-CM | POA: Diagnosis not present

## 2016-08-31 MED ORDER — BUSPIRONE HCL 10 MG PO TABS: ORAL_TABLET | ORAL | 0 refills | Status: DC

## 2016-08-31 NOTE — Progress Notes (Signed)
\  Patient ID: [name], female   DOB: September 29, 1960, 56 y.o.   MRN: [id number] S  Pt requesting to be seen for c/o insomnia and anxiety. Admits she has " alot of stressors " onher Went to [employer] yesterday for husband to have back surgery due to pain and trouble walking only to be told that his symptoms cannot be fully explained by back problem and the question of ALS was raised.She describes herself as his "only caretaker". She is taking starter dose of Buspar 5mg  TID and didnt remember instructions about increasing dose if needed.  She falls asleep and 2 hours later she awakens and cant go back to sleep,She thinks she is taking 200 mg of Trazodone prescribed by Dr Toy Care but there is  no record of this in EPIC  O- Alert, oriented, appropriate and comprehensible  Review of meds negative for rx Trazodone MDQ negative Sobriety date 08/10/2016  A- AUD severe dependence      21 days S/P last drink insomnia consistent with ETOH induced sleep disorder and A ETOH             induced anxiety DO      Codependency  P- Increase Buspar to 10-20 mg TID/Use Vistaril for breakthru      Check bottle of slep meds and bring to [facility]-May take 300mg  of Trazodone       FU with me next week

## 2016-09-01 ENCOUNTER — Encounter (HOSPITAL_COMMUNITY): Payer: Self-pay | Admitting: Psychology

## 2016-09-01 ENCOUNTER — Other Ambulatory Visit (HOSPITAL_COMMUNITY): Payer: BLUE CROSS/BLUE SHIELD | Admitting: Psychology

## 2016-09-01 ENCOUNTER — Other Ambulatory Visit (HOSPITAL_COMMUNITY): Payer: Self-pay | Admitting: Medical

## 2016-09-01 DIAGNOSIS — F1021 Alcohol dependence, in remission: Secondary | ICD-10-CM

## 2016-09-01 DIAGNOSIS — F1910 Other psychoactive substance abuse, uncomplicated: Secondary | ICD-10-CM | POA: Diagnosis not present

## 2016-09-01 MED ORDER — BUSPIRONE HCL 10 MG PO TABS: ORAL_TABLET | ORAL | 0 refills | Status: DC

## 2016-09-01 NOTE — Progress Notes (Signed)
Daily Group Progress Note  Program: CD-IOP   09/01/2016 [name] [id number]  Diagnosis:  No diagnosis found.   Sobriety Date: 1/3  Group Time: 1-3:30pm  Participation Level: Active  Behavioral Response: Appropriate and Sharing  Type of Therapy: Process Group  Interventions: Supportive  Topic: Process: The first half of group was spent in process. Group members shared about the past week and the two snow days that kept this office closed. The group had not met since last Monday. There was a lot to report on and, clearly, the heavy snow poor roads upset the routines and plans for many. A new group member was present today along with another group member who had been out for two weeks in a relapse. Drug tests were collected from everyone present and the medical director met with at least 3 group members today.   Group Time: 3:30-4pm  Participation Level: Active  Behavioral Response: Sharing  Type of Therapy: Activity Group  Interventions: Strength-based  Topic: Psycho-Ed: The psycho-ed was very brief due to the lengthy time spent in process. A five-minute relaxation meditation was played, and the group processed about their experience of the guided meditation. Members reported it had proven relaxing and they enjoyed the activity.  Summary: The patient reported a challenging week since we last met. She explained she had received a notice in the mail on Tuesday stating her home owners' insurance had been cancelled due to lack of payment. The patient reported she knew she had sent a check, but there was not record that the company had received it. She became upset because a winter was coming and feared problems and possible damage and no insurance. When she phoned the insurance agent, they were very insensitive and indifferent, which made her very angry. Today, she noted, "I am going to change my insurance'. Despite these challenges, the patient denied any cravings or  thoughts of drinking. In the activity, the patient reported she had found it relaxing and noted that she spends time every day praying, mediations and reflecting. The patient responded well to this intervention and continues to display a growing willingness to share about her life and struggles. She made a supportive comment to another group member that was very validating. She had attended one AA meeting since last week.   UDS collected: Yes Results: negative  AA/NA attended?: YesMonday  Sponsor?: Yes   Brandon Melnick, [place] 09/01/2016 5:37 PM

## 2016-09-04 ENCOUNTER — Encounter (HOSPITAL_COMMUNITY): Payer: Self-pay | Admitting: Psychology

## 2016-09-04 NOTE — Progress Notes (Signed)
    Daily Group Progress Note  Program: CD-IOP   09/04/2016 [name] [id number]  Diagnosis:  No diagnosis found.   Sobriety Date: 1/3  Group Time: 1-2:30pm  Participation Level: Active  Behavioral Response: Appropriate  Type of Therapy: Process Group  Interventions: Supportive  Topic: Process: Counselors met with patients to discuss the activities they had engaged in to support their recovery. A new group member introduced herself to the group and shared her reasons for pursuing treatment. All members were engaged in the discussion concerning recovery from mind-altering drugs and alcohol.  Group Time: 2:30-4pm  Participation Level: Active  Behavioral Response: Appropriate and Sharing  Type of Therapy: Psycho-education Group  Interventions: Solution Focused  Topic: Psychoeducation: Group leaders provided information around "affective" processes, and facilitated a discussion regarding each group member's subjective experiences of emotions; this was achieved through a process game, "Emotional Jenga." Particular emphasis was placed on how certain emotional experiences can be used as insight into client's needs, concerns, or growth related to their recovery. Drug tests were collected from several members.   Summary:  The patient presented as engaged throughout group, though tends to be quiet at times. She shared in her check in that she gained from attending a meeting on resentment, and demonstrated insight into how "letting go and opening up" provides a sense of peace. Patient provided that she practices coping skills and reciting the Serenity Prayer to bolster this. She described her sleep as improved due to medication changes. Patient reported attending one [place] meeting.   UDS collected: No Results:   AA/NA attended?: YesThursday  Sponsor?: Yes   [name], [place] 09/04/2016 9:53 AM

## 2016-09-05 ENCOUNTER — Other Ambulatory Visit (HOSPITAL_COMMUNITY): Payer: BLUE CROSS/BLUE SHIELD

## 2016-09-06 ENCOUNTER — Encounter: Payer: Self-pay | Admitting: Physical Therapy

## 2016-09-06 ENCOUNTER — Ambulatory Visit: Payer: BLUE CROSS/BLUE SHIELD | Attending: Physician Assistant | Admitting: Physical Therapy

## 2016-09-06 DIAGNOSIS — M25511 Pain in right shoulder: Secondary | ICD-10-CM | POA: Insufficient documentation

## 2016-09-06 DIAGNOSIS — M25611 Stiffness of right shoulder, not elsewhere classified: Secondary | ICD-10-CM | POA: Insufficient documentation

## 2016-09-06 NOTE — Patient Instructions (Signed)
MOVE SHOULDER UP TO THE PAIN BUT NOT INTO THE PAIN.  Abduction (Passive)   With arm out to side, resting on table, lower head toward arm, keeping trunk away from table. Hold _5-10___ seconds. Repeat _10___ times. Do _2-3___ sessions per day. Scoot chair away from table for greater ROM.  Copyright  VHI. All rights reserved.   Flexion (Passive)   Sitting upright, slide forearm forward along table, bending from the waist until a stretch is felt. Hold _5-10___ seconds. Repeat __10__ times. Do _2-3___ sessions per day.    Thoracic Self-Mobilization Stretch (Supine)   With small rolled towel at lower ribs level, gently lie back until stretch is felt. Hold 1-5 minutes. Relax. Repeat _3___ times per set. Do ____ sets per session. Do __2__ sessions per day. You can move the towel higher as needed.  http://orth.exer.us/994   Copyright  VHI. All rights reserved.   Trigger Point Dry Needling  . What is Trigger Point Dry Needling (DN)? o DN is a physical therapy technique used to treat muscle pain and dysfunction. Specifically, DN helps deactivate muscle trigger points (muscle knots).  o A thin filiform needle is used to penetrate the skin and stimulate the underlying trigger point. The goal is for a local twitch response (LTR) to occur and for the trigger point to relax. No medication of any kind is injected during the procedure.   . What Does Trigger Point Dry Needling Feel Like?  o The procedure feels different for each individual patient. Some patients report that they do not actually feel the needle enter the skin and overall the process is not painful. Very mild bleeding may occur. However, many patients feel a deep cramping in the muscle in which the needle was inserted. This is the local twitch response.   Marland Kitchen How Will I feel after the treatment? o Soreness is normal, and the onset of soreness may not occur for a few hours. Typically this soreness does not last longer than two days.    o Bruising is uncommon, however; ice can be used to decrease any possible bruising.  o In rare cases feeling tired or nauseous after the treatment is normal. In addition, your symptoms may get worse before they get better, this period will typically not last longer than 24 hours.   . What Can I do After My Treatment? o Increase your hydration by drinking more water for the next 24 hours. o You may place ice or heat on the areas treated that have become sore, however, do not use heat on inflamed or bruised areas. Heat often brings more relief post needling. o You can continue your regular activities, but vigorous activity is not recommended initially after the treatment for 24 hours. o DN is best combined with other physical therapy such as strengthening, stretching, and other therapies.    Precautions:  In some cases, dry needling is done over the lung field. While rare, there is a risk of pneumothorax (punctured lung). Because of this, if you ever experience shortness of breath on exertion, difficulty taking a deep breath, chest pain or a dry cough following dry needling, you should report to an emergency room and tell them that you have been dry needled over the thorax.   [name], PT 09/06/16 11:54 AM; [facility] [place], [place], [address] Phone: [phone]   Fax:  [phone]
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
## 2016-09-07 ENCOUNTER — Other Ambulatory Visit (HOSPITAL_COMMUNITY): Payer: BLUE CROSS/BLUE SHIELD | Admitting: Psychology

## 2016-09-07 DIAGNOSIS — F1020 Alcohol dependence, uncomplicated: Secondary | ICD-10-CM

## 2016-09-07 DIAGNOSIS — F419 Anxiety disorder, unspecified: Secondary | ICD-10-CM

## 2016-09-07 DIAGNOSIS — F331 Major depressive disorder, recurrent, moderate: Secondary | ICD-10-CM

## 2016-09-07 DIAGNOSIS — Z8659 Personal history of other mental and behavioral disorders: Secondary | ICD-10-CM

## 2016-09-07 DIAGNOSIS — F1910 Other psychoactive substance abuse, uncomplicated: Secondary | ICD-10-CM | POA: Diagnosis not present

## 2016-09-08 ENCOUNTER — Other Ambulatory Visit (HOSPITAL_COMMUNITY): Payer: BLUE CROSS/BLUE SHIELD | Attending: Psychiatry | Admitting: Psychology

## 2016-09-08 ENCOUNTER — Encounter (HOSPITAL_COMMUNITY): Payer: Self-pay | Admitting: Psychology

## 2016-09-08 DIAGNOSIS — F4312 Post-traumatic stress disorder, chronic: Secondary | ICD-10-CM | POA: Diagnosis not present

## 2016-09-08 DIAGNOSIS — Z62810 Personal history of physical and sexual abuse in childhood: Secondary | ICD-10-CM | POA: Insufficient documentation

## 2016-09-08 DIAGNOSIS — F19982 Other psychoactive substance use, unspecified with psychoactive substance-induced sleep disorder: Secondary | ICD-10-CM

## 2016-09-08 DIAGNOSIS — F329 Major depressive disorder, single episode, unspecified: Secondary | ICD-10-CM | POA: Diagnosis not present

## 2016-09-08 DIAGNOSIS — F1020 Alcohol dependence, uncomplicated: Secondary | ICD-10-CM

## 2016-09-08 DIAGNOSIS — Z6372 Alcoholism and drug addiction in family: Secondary | ICD-10-CM | POA: Insufficient documentation

## 2016-09-08 DIAGNOSIS — F1021 Alcohol dependence, in remission: Secondary | ICD-10-CM | POA: Insufficient documentation

## 2016-09-08 DIAGNOSIS — R451 Restlessness and agitation: Secondary | ICD-10-CM | POA: Insufficient documentation

## 2016-09-08 DIAGNOSIS — I10 Essential (primary) hypertension: Secondary | ICD-10-CM | POA: Insufficient documentation

## 2016-09-08 DIAGNOSIS — F419 Anxiety disorder, unspecified: Secondary | ICD-10-CM | POA: Insufficient documentation

## 2016-09-08 DIAGNOSIS — F418 Other specified anxiety disorders: Secondary | ICD-10-CM | POA: Diagnosis present

## 2016-09-08 DIAGNOSIS — F609 Personality disorder, unspecified: Secondary | ICD-10-CM | POA: Insufficient documentation

## 2016-09-08 MED ORDER — TRAZODONE HCL 300 MG PO TABS: 300.0000 mg | ORAL_TABLET | Freq: Every day | ORAL | 0 refills | Status: DC

## 2016-09-08 NOTE — Progress Notes (Signed)
    Daily Group Progress Note  Program: CD-IOP   09/08/2016 [name] Barbara [id number]  Diagnosis:  Alcohol use disorder, severe, dependence ([place])  History of posttraumatic stress disorder (PTSD)  Chronic anxiety  Moderate episode of recurrent major depressive disorder ([place])   Sobriety Date: 08/10/16  Group Time: 1-2:30pm  Participation Level: Active  Behavioral Response: Appropriate and Sharing  Type of Therapy: Process Group  Interventions: CBT, Motivational Interviewing and Solution Focused  Topic: Patients were active and engaged for process session and check-in. One patient shared she had used last night. One patient was absent w/o notice. Patients discussed their challenges and "speed bumps" in recovery. Patients shared openly and gave each other feedback about pursuing jobs, sponsorship in 12 steps, and symptoms of PAWS.     Group Time: 2:30-4pm  Participation Level: Minimal  Behavioral Response: Appropriate  Type of Therapy: Psycho-education Group  Interventions: Solution Focused and Assertiveness Training  Topic: Patients were active and engaged in a discussion about "Communication in early recovery". Counselor led discussion with handout on "obstacles and goals of assertive communication". Patients were able to link their experiences of struggling to communicate their needs and learned from each other's past mistakes. Some patients stated the handout was very helpful.    Summary: Patient continues to make progress by maintaining early sobriety, having all negative UDS, increasing her feelings of positivity and reporting healthier communication with her husband and children. Patients reported she attended 2 AA meetings since last group. She was happy to share her husband is initiating in his own ADL's which is a marked improvement. Patient stated she has an appointment to address a "frozen shoulder" for which she asked for encouragement from the group. 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 Patient did not share significantly in psychoeducation session. Counselor will work to draw out more verbalizations during topical discussion. Pt stated she "is a listener" and likes to "take the group in".    UDS collected: No Results: negative  AA/NA attended?: YesMonday and Tuesday  Sponsor?: Yes   Brandon Melnick, LCAS 09/08/2016 10:44 AM

## 2016-09-10 ENCOUNTER — Encounter (HOSPITAL_COMMUNITY): Payer: Self-pay | Admitting: Psychology

## 2016-09-10 NOTE — Progress Notes (Signed)
Medication refill request.

## 2016-09-11 NOTE — Progress Notes (Signed)
    Daily Group Progress Note  Program: CD-IOP   09/11/2016 [name] [id number]  Diagnosis:  Alcohol use disorder, severe, dependence ([place])  Substance-induced sleep disorder ([place])   Sobriety Date: 08/10/16  Group Time: 1-2:30pm  Participation Level: Active  Behavioral Response: Appropriate  Type of Therapy: Process Group  Interventions: Supportive  Topic: Counselors met with patients to discuss the activities they had engaged in to support their recovery. All members were engaged in the discussion concerning recovery from mind-altering drugs and alcohol.   Group Time: 2:30-4pm  Participation Level: Active  Behavioral Response: Sharing  Type of Therapy: Activity Group  Interventions: Assertiveness Training  Topic: Group leaders provided information around effective and assertive communication skills. Group members' role played certain recovery related scenarios, and engaged in supportive feedback and reflection on their efforts. Drug tests were collected from several members.   Summary: The patient presented as engaged throughout group, though tends to be quiet at times. She shared in her check-in that she completed Step 1, and her sponsor was proud of her work and insight. Group leaders encouraged her to continue to share her progress with step work to the group. She spoke about feeling angry and agitated with the delay in receiving her Trazodone, but that she did not become as escalated in her affective response. Patient reported attending one [place] meeting. She responded well to this intervention.   UDS collected: No Results:   AA/NA attended?: YesThursday  Sponsor?: Yes   Brandon Melnick, LCAS 09/11/2016 10:45 AM

## 2016-09-12 ENCOUNTER — Other Ambulatory Visit (HOSPITAL_COMMUNITY): Payer: BLUE CROSS/BLUE SHIELD | Admitting: Psychology

## 2016-09-12 ENCOUNTER — Encounter (HOSPITAL_COMMUNITY): Payer: Self-pay | Admitting: Psychology

## 2016-09-12 DIAGNOSIS — F1020 Alcohol dependence, uncomplicated: Secondary | ICD-10-CM

## 2016-09-12 DIAGNOSIS — Z62810 Personal history of physical and sexual abuse in childhood: Secondary | ICD-10-CM | POA: Diagnosis not present
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
## 2016-09-13 ENCOUNTER — Encounter: Payer: Self-pay | Admitting: Physical Therapy

## 2016-09-13 ENCOUNTER — Ambulatory Visit: Payer: BLUE CROSS/BLUE SHIELD | Attending: Physician Assistant | Admitting: Physical Therapy

## 2016-09-13 ENCOUNTER — Encounter (HOSPITAL_COMMUNITY): Payer: Self-pay | Admitting: Psychology

## 2016-09-13 DIAGNOSIS — M25511 Pain in right shoulder: Secondary | ICD-10-CM | POA: Insufficient documentation

## 2016-09-13 DIAGNOSIS — M25611 Stiffness of right shoulder, not elsewhere classified: Secondary | ICD-10-CM | POA: Diagnosis not present
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
## 2016-09-13 NOTE — Patient Instructions (Addendum)
ROM: Extension - Wand (Standing)    Stand holding wand behind back. Raise arms as far as possible. Repeat _10___ times per set. Do __2__ sets per session. Do _2-3___ sessions per day.  http://orth.exer.us/930   Copyright  VHI. All rights reserved.  ROM: External / Internal Rotation - Wand    Bring wand up over head, then down toward waistline. Hold each position __5__ seconds. Repeat __10__ times per set. Do __2__ sets per session. Do _2-3___ sessions per day.  http://orth.exer.us/750   Copyright  VHI. All rights reserved.  ROM: Flexion - Wand (Supine)    Lie on back holding wand. Raise arms over head.  Repeat __10__ times per set. Do __2__ sets per session. Do __2-3__ sessions per day.  http://orth.exer.us/928   Copyright  VHI. All rights reserved.

## 2016-09-13 NOTE — Progress Notes (Signed)
    Daily Group Progress Note  Program: CD-IOP   09/13/2016 [name] [id number]  Diagnosis:  No diagnosis found.   Sobriety Date: 08/10/16  Group Time: 1-2:30pm  Participation Level: Active  Behavioral Response: Sharing  Type of Therapy: Activity Group  Interventions: Chair Yoga  Topic: Activity: Chair Yoga. The first half of group was spent in an activity. After a very brief check-in, a yoga instructor arrived to facilitate the group in 'Chair Yoga'. Members were led in a very gentle series of movements. Throughout the activity, they were encouraged to only do what they felt like doing. At the end of the session, group members shared about their experience of the session. A positive experience was expressed by all. Drug tests were collected from several group members.  Group Time: 2:30-4pm  Participation Level: Active  Behavioral Response: Appropriate and Sharing  Type of Therapy: Process Group  Interventions: Solution Focused  Topic: Process: The second half of group was spent in process. Two members had admitted a new sobriety date and those members discussed their relapses and the circumstances that led to use. The group discussed these events and spoke at length about how to disrupt or prevent a return to use going forward. A new group member was present, and he introduced himself during this half of group. The program director met with this new group member during the second half of group.   Summary: The patient was active in group today. She seemed to enjoy the yoga session. The patient agreed that staying flexible is really important for her because she works and wants to stay healthy. In process, the patient reoported she had rested over the weekend and caught up on some chores around the house. She noted she is going to get physical therapy for her shoulder and hopes that it won't cause any problems for her at work. She is getting along better with her husband now  that he is not drinking and he seems to be regaining some strength that he had lost due to inactivity and his back pain. She shared what she focuses on if she gets thoughts of using and provided helpful feedback. She attended one [place] meeting since we last met. The patient remains sober and is making good progress in her recovery.   UDS collected: Yes Results: Pending  AA/NA attended?: YesMonday  Sponsor?: Yes   Brandon Melnick, LCAS 09/13/2016 8:49 AM

## 2016-09-14 ENCOUNTER — Other Ambulatory Visit (HOSPITAL_COMMUNITY): Payer: BLUE CROSS/BLUE SHIELD | Admitting: Psychology

## 2016-09-14 DIAGNOSIS — F1020 Alcohol dependence, uncomplicated: Secondary | ICD-10-CM

## 2016-09-14 DIAGNOSIS — Z62810 Personal history of physical and sexual abuse in childhood: Secondary | ICD-10-CM | POA: Diagnosis not present

## 2016-09-15 ENCOUNTER — Other Ambulatory Visit (HOSPITAL_COMMUNITY): Payer: BLUE CROSS/BLUE SHIELD | Admitting: Psychology

## 2016-09-15 DIAGNOSIS — Z62810 Personal history of physical and sexual abuse in childhood: Secondary | ICD-10-CM | POA: Diagnosis not present

## 2016-09-15 DIAGNOSIS — F1020 Alcohol dependence, uncomplicated: Secondary | ICD-10-CM

## 2016-09-15 DIAGNOSIS — T7432XS Child psychological abuse, confirmed, sequela: Secondary | ICD-10-CM

## 2016-09-15 NOTE — Progress Notes (Signed)
[name] is a 56 y.o. female patient. CD-IOP: Treatment Plan Update. Met with the patient this morning as scheduled. We meet prior to group on Mondays at noon for her weekly session. She reported she had not felt well on Saturday but rested for the rest of the weekend and felt better today. She had attended the [place] meeting this morning at 10 am. I explained the importance of evaluating her progress towards the goals she identified when she first began the program. The patient was very agreeable to reviewing her goals. I noted she has made good progress towards her first goal of treatment, which was to stay drug-free. She last drank prior to beginning the program and has remained sober, per her report and results of random drug tests, since then. Regarding her second goal of treatment, to build support, the patient reported she had received accolades from her sponsor about how well she has done. Her engagement in the [facility] was applauded by this counselor and her securing a sponsor. She has done everything we have asked of her and is compliant in all aspects of the program. The patient's third treatment goal addresses her intention to return to full-time employment. I noted that she is meeting all requirements as stated by her employer and [employer] and should she remain on the current course, she will be returning to work around March 1. The patient agreed that she will be pleased to get back to work and intends to continue this path going forward. The patient appeared pleased and intends to remain compliant and do all that is required of her. The update was completed. The patient has done well to date and her sobriety date remains 08/10/16.         Brandon Melnick, LCAS

## 2016-09-16 ENCOUNTER — Encounter (HOSPITAL_COMMUNITY): Payer: Self-pay | Admitting: Psychology

## 2016-09-16 ENCOUNTER — Ambulatory Visit: Payer: BLUE CROSS/BLUE SHIELD | Admitting: Physical Therapy

## 2016-09-16 ENCOUNTER — Encounter: Payer: Self-pay | Admitting: Physical Therapy

## 2016-09-16 DIAGNOSIS — M25611 Stiffness of right shoulder, not elsewhere classified: Secondary | ICD-10-CM | POA: Diagnosis not present

## 2016-09-16 DIAGNOSIS — M25511 Pain in right shoulder: Secondary | ICD-10-CM | POA: Diagnosis not present

## 2016-09-16 NOTE — Progress Notes (Signed)
Daily Group Progress Note  Program: CD-IOP   09/16/2016 [name] Read [id number]  Diagnosis:  No diagnosis found.   Sobriety Date: 08/10/16  Group Time: 1-2:30pm  Participation Level: Active  Behavioral Response: Sharing  Type of Therapy: Process Group  Interventions: Supportive  Topic: Process: Counselors met with patients to discuss the activities they had engaged in to support their recovery. All members were engaged in the discussion concerning recovery from mind-altering drugs and alcohol.  Group Time: 2:30-4pm  Participation Level: Active  Behavioral Response: Sharing  Type of Therapy: Psycho-education Group  Interventions: Strength-based  Topic: Psychoeducation: Group leaders provided information around creating individual relapse prevention plans, which included tips for avoiding relapse. Group members discussed coping skills, social support, and outcomes of relapse and sobriety. Drug tests were collected from several members.    Summary: The patient presented as engaged throughout group. She shared about frustration and resentment resurfacing around her mother's death, obituary, and funeral. Patient demonstrated increased ability to engage in social support, aeb talking to her husband about these feelings. Patient had insight into her triggers, which included drinking at certain times of the day and orange juice. Her reasons for staying sober included her maintaining family relationships, improving her health, and keeping her job. Patient reported attending two [place] meetings since we last met.   UDS collected: No Results:   AA/NA attended?: YesTuesday and Wednesday  Sponsor?: Yes   Brandon Melnick, LCAS 09/16/2016 11:21 AM
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
## 2016-09-17 ENCOUNTER — Encounter (HOSPITAL_COMMUNITY): Payer: Self-pay | Admitting: Psychology

## 2016-09-17 NOTE — Progress Notes (Signed)
    Daily Group Progress Note  Program: CD-IOP   09/17/2016 [name] [id number]  Diagnosis:  No diagnosis found.   Sobriety Date: 1/3  Group Time: 1-2:30pm  Participation Level: Active  Behavioral Response: Appropriate  Type of Therapy: Process Group  Interventions: Supportive  Topic:  Process: Counselors met with patients to discuss the activities they had engaged in to support their recovery. All group members were asked to check in with a "shining moment" as well as a "speed bump" related to their recovery.  All members were engaged in the discussion concerning recovery from mind-altering drugs and alcohol.  Group Time: 2:30-4pm  Participation Level: Active  Behavioral Response: Sharing  Type of Therapy: Psycho-education Group  Interventions: Strength-based  Topic: Psychoeducation: Counselors led a discussion on the bio-psycho-social-spiritual model of recovery. Patients were asked to identify how these elements had been affected during their childhood/addiction as well as how they were positively impacted by their recovery. This led to a group discussion regarding spirituality and how it could be practically applied in recovery. A drug test was collected from one patient.   Summary: The patient presented as moderately active and engaged in group. For her "shining moment" the patient shared that she had felt overwhelmed by an unexpected situation and was able to use the serenity prayer to reduce her stress. By reacting differently, she shared that she had experienced "peace." The counselor encouraged her to continue being mindful of her present experiences. Patient had attended one [place] meeting since we last met.   UDS collected: No Results:   AA/NA attended?: YesWednesday  Sponsor?: Yes   Brandon Melnick, LCAS 09/17/2016 4:20 PM

## 2016-09-19 ENCOUNTER — Other Ambulatory Visit (HOSPITAL_COMMUNITY): Payer: BLUE CROSS/BLUE SHIELD | Admitting: Psychology

## 2016-09-19 DIAGNOSIS — F1020 Alcohol dependence, uncomplicated: Secondary | ICD-10-CM

## 2016-09-19 DIAGNOSIS — F19982 Other psychoactive substance use, unspecified with psychoactive substance-induced sleep disorder: Secondary | ICD-10-CM

## 2016-09-19 DIAGNOSIS — F419 Anxiety disorder, unspecified: Secondary | ICD-10-CM

## 2016-09-19 DIAGNOSIS — T7432XS Child psychological abuse, confirmed, sequela: Secondary | ICD-10-CM

## 2016-09-19 DIAGNOSIS — F331 Major depressive disorder, recurrent, moderate: Secondary | ICD-10-CM

## 2016-09-19 DIAGNOSIS — Z8659 Personal history of other mental and behavioral disorders: Secondary | ICD-10-CM

## 2016-09-19 DIAGNOSIS — Z62810 Personal history of physical and sexual abuse in childhood: Secondary | ICD-10-CM | POA: Diagnosis not present

## 2016-09-20 ENCOUNTER — Ambulatory Visit: Payer: BLUE CROSS/BLUE SHIELD | Admitting: Physical Therapy

## 2016-09-20 ENCOUNTER — Encounter (HOSPITAL_COMMUNITY): Payer: Self-pay | Admitting: Psychology

## 2016-09-20 DIAGNOSIS — M25611 Stiffness of right shoulder, not elsewhere classified: Secondary | ICD-10-CM

## 2016-09-20 DIAGNOSIS — M25511 Pain in right shoulder: Secondary | ICD-10-CM
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
## 2016-09-20 NOTE — Patient Instructions (Addendum)
Repeat all 10 times, 1-3 sets, 2-3 x/day  ROM: Flexion - Wand (Supine)    Lie on back holding wand. Raise arms over head.  Repeat ____ times per set. Do ____ sets per session. Do ____ sessions per day.  http://orth.exer.us/929   Copyright  VHI. All rights reserved.   ROM: External Rotation - Wand (Supine)    Lie on back holding wand with elbows bent to 90. Rotate forearms over head as far as possible.  Repeat ____ times per set. Do ____ sets per session. Do ____ sessions per day.  http://orth.exer.us/933   Copyright  VHI. All rights reserved.  ROM: Extension - Wand (Standing)    Stand holding wand behind back. Raise arms as far as possible. Repeat ____ times per set. Do ____ sets per session. Do ____ sessions per day.  http://orth.exer.us/931   Copyright  VHI. All rights reserved.   Trigger Point Dry Needling  . What is Trigger Point Dry Needling (DN)? o DN is a physical therapy technique used to treat muscle pain and dysfunction. Specifically, DN helps deactivate muscle trigger points (muscle knots).  o A thin filiform needle is used to penetrate the skin and stimulate the underlying trigger point. The goal is for a local twitch response (LTR) to occur and for the trigger point to relax. No medication of any kind is injected during the procedure.   . What Does Trigger Point Dry Needling Feel Like?  o The procedure feels different for each individual patient. Some patients report that they do not actually feel the needle enter the skin and overall the process is not painful. Very mild bleeding may occur. However, many patients feel a deep cramping in the muscle in which the needle was inserted. This is the local twitch response.   Marland Kitchen How Will I feel after the treatment? o Soreness is normal, and the onset of soreness may not occur for a few hours. Typically this soreness does not last longer than two days.  o Bruising is uncommon, however; ice can be used to decrease  any possible bruising.  o In rare cases feeling tired or nauseous after the treatment is normal. In addition, your symptoms may get worse before they get better, this period will typically not last longer than 24 hours.   . What Can I do After My Treatment? o Increase your hydration by drinking more water for the next 24 hours. o You may place ice or heat on the areas treated that have become sore, however, do not use heat on inflamed or bruised areas. Heat often brings more relief post needling. o You can continue your regular activities, but vigorous activity is not recommended initially after the treatment for 24 hours. o DN is best combined with other physical therapy such as strengthening, stretching, and other therapies.    Precautions:  In some cases, dry needling is done over the lung field. While rare, there is a risk of pneumothorax (punctured lung). Because of this, if you ever experience shortness of breath on exertion, difficulty taking a deep breath, chest pain or a dry cough following dry needling, you should report to an emergency room and tell them that you have been dry needled over the thorax.   [name], PT 09/20/16 9:16 AM [facility] [address] [place], [place], [address] Phone: [phone]   Fax:  [phone]
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
## 2016-09-21 ENCOUNTER — Other Ambulatory Visit (HOSPITAL_COMMUNITY): Payer: BLUE CROSS/BLUE SHIELD | Admitting: Psychology

## 2016-09-21 ENCOUNTER — Other Ambulatory Visit (HOSPITAL_COMMUNITY): Payer: Self-pay | Admitting: Medical

## 2016-09-21 DIAGNOSIS — T7432XS Child psychological abuse, confirmed, sequela: Secondary | ICD-10-CM

## 2016-09-21 DIAGNOSIS — Z62810 Personal history of physical and sexual abuse in childhood: Secondary | ICD-10-CM | POA: Diagnosis not present

## 2016-09-21 DIAGNOSIS — F1020 Alcohol dependence, uncomplicated: Secondary | ICD-10-CM

## 2016-09-22 ENCOUNTER — Encounter (HOSPITAL_COMMUNITY): Payer: Self-pay | Admitting: Psychology

## 2016-09-22 ENCOUNTER — Other Ambulatory Visit (HOSPITAL_COMMUNITY): Payer: BLUE CROSS/BLUE SHIELD | Admitting: Psychology

## 2016-09-22 DIAGNOSIS — F1020 Alcohol dependence, uncomplicated: Secondary | ICD-10-CM

## 2016-09-22 DIAGNOSIS — Z62810 Personal history of physical and sexual abuse in childhood: Secondary | ICD-10-CM | POA: Diagnosis not present

## 2016-09-22 DIAGNOSIS — T7432XS Child psychological abuse, confirmed, sequela: Secondary | ICD-10-CM

## 2016-09-23 ENCOUNTER — Ambulatory Visit: Payer: BLUE CROSS/BLUE SHIELD | Admitting: Physical Therapy

## 2016-09-23 DIAGNOSIS — M25611 Stiffness of right shoulder, not elsewhere classified: Secondary | ICD-10-CM

## 2016-09-23 DIAGNOSIS — F3342 Major depressive disorder, recurrent, in full remission: Secondary | ICD-10-CM | POA: Diagnosis not present

## 2016-09-23 DIAGNOSIS — F411 Generalized anxiety disorder: Secondary | ICD-10-CM | POA: Diagnosis not present

## 2016-09-23 DIAGNOSIS — M25511 Pain in right shoulder: Secondary | ICD-10-CM | POA: Diagnosis not present
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
## 2016-09-23 NOTE — Patient Instructions (Signed)
  Thoracic Self-Mobilization Stretch (Supine)   With small rolled towel at lower ribs level, gently lie back until stretch is felt. Hold 1-5 minutes. Relax. Repeat ____ times per set. Do ____ sets per session. Do 2____ sessions per day.  http://orth.exer.us/994   Copyright  VHI. All rights reserved.   Lower abdominal/core stability exercises  1. Practice your breathing technique: Inhale through your nose expanding your belly and rib cage. Try not to breathe into your chest. Exhale slowly and gradually out your mouth feeling a sense of softness to your body. Practice multiple times. This can be performed unlimited. Do at least 10-20 breaths per session.   CHEST STRETCH: DURING EITHER OF THE EXERCISES ABOVE PUT YOUR RIGHT ARM OUT TO THE SIDE AND HANG A LITTLE OFF THE BED TO STRETCH THE FRONT OF YOUR SHOULDER. Hold as long as you can tolerate.  Madelyn Flavors, PT 09/23/16 8:55 AM [facility] [address] [place], [place], [address] Phone: [phone]   Fax:  [phone]

## 2016-09-24 ENCOUNTER — Encounter (HOSPITAL_COMMUNITY): Payer: Self-pay | Admitting: Psychology

## 2016-09-24 NOTE — Progress Notes (Signed)
    Daily Group Progress Note  Program: CD-IOP   09/24/2016 [name] [id number]  Diagnosis:  No diagnosis found.   Sobriety Date: 1/3  Group Time: 1-2:30pm  Participation Level: Active  Behavioral Response: Appropriate and Sharing  Type of Therapy: Process Group  Interventions: Supportive  Topic: Process: Counselors met with patients to discuss the activities they had engaged in since the previous group to support their recovery. The counselors created space for group members to express their feelings regarding the recent transitions the group had undergone. The counselors were able to clarify expectations of group members and facilitate a psycho-ed on group norms. All members were engaged in the discussion regarding recovery from mind-altering drugs and alcohol.   Group Time: 2:30-4pm  Participation Level: Minimal  Behavioral Response: Sharing  Type of Therapy: Psycho-education Group  Interventions:activity  Topic: Psychoeducation: Counselors facilitated a team building exercise in light of the recent group transitions. There was also a brief psycho-ed on emotional regulation and expression. In the past week, there have been three new group members, two departures, and two upcoming departures. The last fifteen minutes were dedicated to allowing the group to share their hopes for recovery for two group members transitioning to treatment for co-occurring disorders.   Summary: The client was moderately active and engaged in group. She shared that she had an increase in back pain that had been causing her difficulty. She became tearful as she shared about wanting to reach out to her son via letter, in hopes of beginning the reconciliation process. He has become estranged from his parents and they have not seen their grandchildren in 62 months. He refuses to speak with them and they do not know what they can do to reconcile the relationship. The group provided positive feedback  and the patient expressed that she felt "encouraged." The patient did not attend any 12-step meetings since the previous group.   UDS collected: No Results:  AA/NA attended?: No  Sponsor?: Yes   Brandon Melnick, LCAS 09/24/2016 2:42 PM
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
## 2016-09-25 ENCOUNTER — Encounter (HOSPITAL_COMMUNITY): Payer: Self-pay | Admitting: Psychology

## 2016-09-25 NOTE — Progress Notes (Signed)
    Daily Group Progress Note  Program: CD-IOP   09/25/2016 [name] [id number]  Diagnosis:  No diagnosis found.   Sobriety Date: 08/10/16  Group Time: 1-2:30pm  Participation Level: Active  Behavioral Response: Appropriate and Sharing  Type of Therapy: Process Group  Interventions: Supportive  Topic: Process: Counselors met with patients to discuss the activities they had engaged in to support their recovery. All members were engaged in the discussion concerning recovery from mind-altering drugs and alcohol.  Group Time: 2:30-4pm  Participation Level: Minimal  Behavioral Response: Sharing  Type of Therapy: Psycho-education Group  Interventions: Family Systems  Topic: Psychoeducation: Group leaders provided information around family roles in addicted families, and the group processed which roles they may identify with. Drug tests were collected from several members.   Summary: 3 The patient presented as engaged throughout group. She reported feeling overwhelmed and frustrated during the prior day because of various stressors, such as expensive bills and neighbor's dog. Group asked her to reflect on coping skills used, and patient had some difficulty identifying specific skills implemented. She did report calling her sponsor. The patient was quieter during the psycho-ed and shared that she was an only child. She was unclear about what role she might have played, but got teary as she shared about the kindness she received from her maternal grandmother. Patient reported attending one [place] meeting since our last group session.  UDS collected: Yes Results: test leaked in transit and was not able to be evaluated  AA/NA attended?: YesWednesday  Sponsor?: Yes   Brandon Melnick, [place] 09/25/2016 9:37 AM

## 2016-09-26 ENCOUNTER — Other Ambulatory Visit (HOSPITAL_COMMUNITY): Payer: BLUE CROSS/BLUE SHIELD | Admitting: Psychology

## 2016-09-26 ENCOUNTER — Encounter (HOSPITAL_COMMUNITY): Payer: Self-pay | Admitting: Psychology

## 2016-09-26 DIAGNOSIS — F1020 Alcohol dependence, uncomplicated: Secondary | ICD-10-CM

## 2016-09-26 DIAGNOSIS — T7432XS Child psychological abuse, confirmed, sequela: Secondary | ICD-10-CM

## 2016-09-27 ENCOUNTER — Ambulatory Visit: Payer: BLUE CROSS/BLUE SHIELD | Admitting: Physical Therapy

## 2016-09-27 ENCOUNTER — Encounter (HOSPITAL_COMMUNITY): Payer: Self-pay | Admitting: Psychology

## 2016-09-27 DIAGNOSIS — M25611 Stiffness of right shoulder, not elsewhere classified: Secondary | ICD-10-CM

## 2016-09-27 DIAGNOSIS — M25511 Pain in right shoulder: Secondary | ICD-10-CM | POA: Diagnosis not present

## 2016-09-27 NOTE — Progress Notes (Signed)
    Daily Group Progress Note  Program: CD-IOP   09/27/2016 [name] [id number]  Diagnosis:  No diagnosis found.   Sobriety Date: 08/10/16  Group Time: 1-2:30pm  Participation Level: Active  Behavioral Response: Sharing  Type of Therapy: Process Group  Interventions: Supportive  Topic: Process: the first half of group was spent in process. Members shared about the past weekend and the 'shining moments' and 'speedbumps' that presented themselves along the way. A new group member was present, and he introduced himself and shared about his drinking had what had brought him here. A drug test was collected from several group members. The medical director met with two people during group today. One member arrived halfway through group and although she could stay, she will not be given credit for this group.   Group Time: 2:30-4pm  Participation Level: Minimal  Behavioral Response: Sharing  Type of Therapy: Psycho-education Group  Interventions: Family Systems  Topic: Psycho-Ed: "The Laundry List". A psycho-ed was presented following process. A handout was provided identifying the 14 traits of an ACOA. These traits are very typical of anyone raised in a dysfunctional family system. Group members read over the handout and discussed traits that they identified within themselves. There was a lively conversation about seeking the approval of others and the fears of criticism. Most members were able to identify having many of these traits themselves. The handout also identified the traits one can embrace and practice to heal or change these dysfunction characteristics.   Summary:  The patient reported she had attended an [place] meeting this morning. She reported that her daughter-in-law had gone to [facility] earlier this morning and is probably in labor with their third child as we speak. She shared that her husband had gotten a call on Friday from the state disability board and  reported his case was going to be heard sometime early this week. They are hopeful that he will be approved for disability. He has already been turned down once. In the psycho-ed, the patient shared little of herself despite being invited by her counselor to share. She displays many of the traits listed in the handout and will benefit from becoming more assertive and confident in her daily life. She showed the group a text and picture she had received of her granddaughter. It may be the patient was so excited and distracted by the new arrival.   A drug test was collected from this patient.    UDS collected: Yes Results: pending  AA/NA attended?: YesMonday  Sponsor?: Yes   Brandon Melnick, LCAS 09/27/2016 10:39 AM
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
## 2016-09-28 ENCOUNTER — Other Ambulatory Visit (HOSPITAL_COMMUNITY): Payer: BLUE CROSS/BLUE SHIELD | Admitting: Psychology

## 2016-09-28 ENCOUNTER — Encounter (HOSPITAL_COMMUNITY): Payer: Self-pay | Admitting: Medical

## 2016-09-28 DIAGNOSIS — F4312 Post-traumatic stress disorder, chronic: Secondary | ICD-10-CM

## 2016-09-28 DIAGNOSIS — Z62810 Personal history of physical and sexual abuse in childhood: Secondary | ICD-10-CM

## 2016-09-28 DIAGNOSIS — Z811 Family history of alcohol abuse and dependence: Secondary | ICD-10-CM

## 2016-09-28 DIAGNOSIS — F1021 Alcohol dependence, in remission: Secondary | ICD-10-CM

## 2016-09-28 DIAGNOSIS — F329 Major depressive disorder, single episode, unspecified: Secondary | ICD-10-CM

## 2016-09-28 DIAGNOSIS — Z6372 Alcoholism and drug addiction in family: Secondary | ICD-10-CM

## 2016-09-28 DIAGNOSIS — F609 Personality disorder, unspecified: Secondary | ICD-10-CM

## 2016-09-28 DIAGNOSIS — F419 Anxiety disorder, unspecified: Secondary | ICD-10-CM

## 2016-09-28 DIAGNOSIS — F32A Depression, unspecified: Secondary | ICD-10-CM

## 2016-09-28 DIAGNOSIS — I10 Essential (primary) hypertension: Secondary | ICD-10-CM

## 2016-09-28 DIAGNOSIS — T7432XS Child psychological abuse, confirmed, sequela: Secondary | ICD-10-CM

## 2016-09-28 MED ORDER — LAMOTRIGINE 25 MG PO TABS: ORAL_TABLET | ORAL | 0 refills | Status: DC
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
## 2016-09-29 ENCOUNTER — Other Ambulatory Visit (HOSPITAL_COMMUNITY): Payer: BLUE CROSS/BLUE SHIELD | Admitting: Psychology

## 2016-09-29 DIAGNOSIS — F329 Major depressive disorder, single episode, unspecified: Secondary | ICD-10-CM

## 2016-09-29 DIAGNOSIS — F1020 Alcohol dependence, uncomplicated: Secondary | ICD-10-CM

## 2016-09-29 DIAGNOSIS — F4312 Post-traumatic stress disorder, chronic: Secondary | ICD-10-CM

## 2016-09-30 ENCOUNTER — Ambulatory Visit: Payer: BLUE CROSS/BLUE SHIELD | Admitting: Physical Therapy

## 2016-09-30 DIAGNOSIS — M25511 Pain in right shoulder: Secondary | ICD-10-CM | POA: Diagnosis not present

## 2016-09-30 DIAGNOSIS — M25611 Stiffness of right shoulder, not elsewhere classified: Secondary | ICD-10-CM | POA: Diagnosis not present
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
## 2016-09-30 NOTE — Progress Notes (Signed)
    Daily Group Progress Note  Program: CD-IOP   09/30/2016 Arriel Victor Chasen [id number]  Diagnosis:  Chronic post-traumatic stress disorder (PTSD)  Alcohol use disorder, severe, dependence (HCC)  Major depression, chronic   Sobriety Date: 1/3  Group Time: 1-2:30  Participation Level: Active  Behavioral Response: Appropriate and Sharing  Type of Therapy: Process Group  Interventions: CBT and Solution Focused  Topic: Counselors met with patients to discuss the activities they had engaged in since the previous group to support their recovery. Patients were asked to check in with a shining moment and a speed bump they had experienced since the previous session. All members were active and engaged in the discussion regarding recovery from mind-altering drugs and alcohol.     Group Time: 2:30-4  Participation Level: Active  Behavioral Response: Appropriate and Sharing  Type of Therapy: Psycho-education Group  Interventions: Meditation: Mindfulnes  Topic: The counselors led a group on mindfulness as a coping skill. Patients participated in a mindful breathing exercise, mindful eating, and a mindful coloring activity. Patients were asked to process how effective these exercises had been for them and how they connected mindfulness to their addictions. Patients were asked to practice one mindfulness activity over the weekend and to report back on how it had gone during the next group. No drug tests were collected from the patients.   Summary: The patient was moderately active and engaged in group. She reported that she learned her sponsor will be moving and therefore she will need to get a new sponsor. She shared that she "understood" why this transition would be necessary. In addition, she shared that she had been able to assertively discuss a distressing situation with her neighbor and hopes to experience positive changes in the coming days. Lastly, the patients husband has  recently had high blood sugar (he is a diabetic) that is a cause of concern for the patient. The patient attended 1 12-step meeting since the previous session.   UDS collected: No Results: negative  AA/NA attended?: YesThursday  Sponsor?: Yes   Brandon Melnick, LCAS 09/30/2016 10:02 AM

## 2016-10-03 ENCOUNTER — Encounter (HOSPITAL_COMMUNITY): Payer: Self-pay | Admitting: Psychology

## 2016-10-03 ENCOUNTER — Other Ambulatory Visit (HOSPITAL_COMMUNITY): Payer: BLUE CROSS/BLUE SHIELD | Admitting: Psychology

## 2016-10-03 DIAGNOSIS — F329 Major depressive disorder, single episode, unspecified: Secondary | ICD-10-CM

## 2016-10-03 DIAGNOSIS — F419 Anxiety disorder, unspecified: Secondary | ICD-10-CM

## 2016-10-03 DIAGNOSIS — F4312 Post-traumatic stress disorder, chronic: Secondary | ICD-10-CM

## 2016-10-03 DIAGNOSIS — F1020 Alcohol dependence, uncomplicated: Secondary | ICD-10-CM
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
## 2016-10-03 NOTE — Progress Notes (Signed)
    Daily Group Progress Note  Program: CD-IOP   10/03/2016 [name] [id number]  Diagnosis:  Alcohol use disorder, severe, dependence (HCC)  Chronic post-traumatic stress disorder (PTSD)  Major depression, chronic  Chronic anxiety   Sobriety Date: 08/10/16  Group Time: 1-2:30  Participation Level: Minimal  Behavioral Response: Appropriate and Minimizing  Type of Therapy: Process Group  Interventions: CBT  Topic: Counselor met with patients for 1.5 hour group process session. Patients were active and engaged in discussion about their recovery from mind-altering chemicals. One patient met with program director for intake. One pt met with program director for discharge. 2 UDS were collected from pts. Counselor asked about homework assignment from previous session in which pt were advised to attempt mindfulness over the weekend.      Group Time: 2:30-4  Participation Level: Active  Behavioral Response: Appropriate and Sharing  Type of Therapy: Psycho-education Group  Interventions: Solution Focused, Supportive and Reframing   Topic: Counselor met with patients for 1.5 hour group psychoeducation session discussing "healthy reward" in recovery and how to reframe success. Patients addressed black and white thinking and brainstormed about differing ways to treat themselves when they resist drugs or alcohol. Patients grouped up into pairs and discussed options. Patients were given homework assignment of writing a "goodbye letter to my addiction".    Summary: Patient was not active during first half of group and did not stated a "speedbump" over the weekend. She stated she had attended 1 AA meetings since last group. She was happy to report that her husband got a good update from the doctor about his physical health. Patient stated privately to counselor she is expected to return to work this weekend and seemed somewhat hesitant about returning. Counselor advised her to talk  to her individual counselor to form a plan for returning to work safely. Patient agreed. Pt was able to identify 6-8 forms of reward she gives herself when she resists drugs and alcohol.    UDS collected: No Results: negative  AA/NA attended?: YesSaturday  Sponsor?: Yes   Brandon Melnick, LCAS 10/03/2016 5:08 PM

## 2016-10-04 ENCOUNTER — Ambulatory Visit: Payer: BLUE CROSS/BLUE SHIELD | Admitting: Physical Therapy

## 2016-10-05 ENCOUNTER — Other Ambulatory Visit (INDEPENDENT_AMBULATORY_CARE_PROVIDER_SITE_OTHER): Payer: BLUE CROSS/BLUE SHIELD | Admitting: Psychology

## 2016-10-05 ENCOUNTER — Encounter (HOSPITAL_COMMUNITY): Payer: Self-pay | Admitting: Medical

## 2016-10-05 DIAGNOSIS — F19982 Other psychoactive substance use, unspecified with psychoactive substance-induced sleep disorder: Secondary | ICD-10-CM

## 2016-10-05 DIAGNOSIS — F4312 Post-traumatic stress disorder, chronic: Secondary | ICD-10-CM

## 2016-10-05 DIAGNOSIS — F419 Anxiety disorder, unspecified: Secondary | ICD-10-CM | POA: Diagnosis not present

## 2016-10-05 DIAGNOSIS — Z62810 Personal history of physical and sexual abuse in childhood: Secondary | ICD-10-CM

## 2016-10-05 DIAGNOSIS — F329 Major depressive disorder, single episode, unspecified: Secondary | ICD-10-CM | POA: Diagnosis not present

## 2016-10-05 DIAGNOSIS — T7432XS Child psychological abuse, confirmed, sequela: Secondary | ICD-10-CM

## 2016-10-05 DIAGNOSIS — F1020 Alcohol dependence, uncomplicated: Secondary | ICD-10-CM

## 2016-10-05 MED ORDER — LAMOTRIGINE 100 MG PO TABS: 100.0000 mg | ORAL_TABLET | Freq: Every day | ORAL | 0 refills | Status: DC

## 2016-10-05 MED ORDER — LAMOTRIGINE 25 MG PO TABS: ORAL_TABLET | ORAL | 0 refills | Status: DC
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
## 2016-10-05 NOTE — Addendum Note (Signed)
Addended by: Dara Hoyer on: 10/05/2016 02:38 PM   Modules accepted: Orders, Level of Service

## 2016-10-06 ENCOUNTER — Other Ambulatory Visit (HOSPITAL_COMMUNITY): Payer: BLUE CROSS/BLUE SHIELD | Attending: Psychiatry | Admitting: Psychology

## 2016-10-06 DIAGNOSIS — F329 Major depressive disorder, single episode, unspecified: Secondary | ICD-10-CM

## 2016-10-06 DIAGNOSIS — F419 Anxiety disorder, unspecified: Secondary | ICD-10-CM

## 2016-10-06 DIAGNOSIS — F4312 Post-traumatic stress disorder, chronic: Secondary | ICD-10-CM

## 2016-10-06 DIAGNOSIS — F1020 Alcohol dependence, uncomplicated: Secondary | ICD-10-CM

## 2016-10-07 ENCOUNTER — Encounter (HOSPITAL_COMMUNITY): Payer: Self-pay | Admitting: Psychology

## 2016-10-07 NOTE — Progress Notes (Signed)
    Daily Group Progress Note  Program: CD-IOP   10/07/2016 Rylann Munford Hare [id number]  Diagnosis:  Alcohol use disorder, severe, dependence (HCC)  Chronic post-traumatic stress disorder (PTSD)  Major depression, chronic  Chronic anxiety   Sobriety Date: 1/3  Group Time: 1-2:30  Participation Level: Active  Behavioral Response: Appropriate and Sharing  Type of Therapy: Process Group  Interventions: CBT and Solution Focused  Topic: Counselors met with patients for 1.5 hour group process session. Counselors used MI techniques, linking, drawing out, and DBT skills to help patients understand and reprocess their experiences in early recovery. Counselor asked patients to mindfully put their cell phones in a basket in the middle and notice how it felt. Drug test results from earlier in the week were returned to 2 patients. One patient became agitated and left group early due to conflict with another group member.     Group Time: 2:30-4  Participation Level: Active  Behavioral Response: Appropriate and Sharing  Type of Therapy: Psycho-education Group  Interventions: Meditation: 5 senses  Topic: Counselors met with patients for 1.5 hour group psychoeducation session. Patients were attentive and shared openly with the group about their "letter to their addiction". The topic cause emotional responses for many members and counselor help patients identify the specific emotions of sadness, guilt, and fear. Counselor led a 5 minute mindfulness grounding exercise that explored noticing senses. One member was talked to privately about an inconsistent UDS result and he appeared fully compliant during that discussion. One patient graduated successfully from CD-IOP today.   Summary: Patient reported on her family's continued strengthening of bonds and how her husband is becoming more supportive. Patient graduated successfully from CD-IOP today. She was tearful at graduation ceremony and  stated she was "going to miss the group tremendously". Patient's discharge plan and summary will be uploaded by Brandon Melnick and Darlyne Russian.   UDS collected: No Results: negative  AA/NA attended?: Yes  Sponsor?: Yes   Brandon Melnick, LCAS 10/07/2016 1:11 PM
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
## 2016-10-10 ENCOUNTER — Encounter: Payer: BLUE CROSS/BLUE SHIELD | Admitting: Physical Therapy

## 2016-10-10 ENCOUNTER — Other Ambulatory Visit (HOSPITAL_COMMUNITY): Payer: BLUE CROSS/BLUE SHIELD

## 2016-10-11 ENCOUNTER — Ambulatory Visit: Payer: BLUE CROSS/BLUE SHIELD | Attending: Physician Assistant | Admitting: Physical Therapy

## 2016-10-11 DIAGNOSIS — M25511 Pain in right shoulder: Secondary | ICD-10-CM

## 2016-10-11 DIAGNOSIS — M25611 Stiffness of right shoulder, not elsewhere classified: Secondary | ICD-10-CM
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
## 2016-10-12 ENCOUNTER — Other Ambulatory Visit (HOSPITAL_COMMUNITY): Payer: BLUE CROSS/BLUE SHIELD

## 2016-10-13 ENCOUNTER — Other Ambulatory Visit (HOSPITAL_COMMUNITY): Payer: BLUE CROSS/BLUE SHIELD

## 2016-10-14 ENCOUNTER — Encounter: Payer: BLUE CROSS/BLUE SHIELD | Admitting: Physical Therapy

## 2016-10-17 ENCOUNTER — Other Ambulatory Visit (HOSPITAL_COMMUNITY): Payer: BLUE CROSS/BLUE SHIELD

## 2016-10-19 ENCOUNTER — Other Ambulatory Visit (HOSPITAL_COMMUNITY): Payer: BLUE CROSS/BLUE SHIELD

## 2016-10-20 ENCOUNTER — Other Ambulatory Visit (HOSPITAL_COMMUNITY): Payer: BLUE CROSS/BLUE SHIELD

## 2016-10-24 ENCOUNTER — Other Ambulatory Visit (HOSPITAL_COMMUNITY): Payer: BLUE CROSS/BLUE SHIELD

## 2016-10-26 ENCOUNTER — Other Ambulatory Visit (HOSPITAL_COMMUNITY): Payer: BLUE CROSS/BLUE SHIELD

## 2016-10-27 ENCOUNTER — Other Ambulatory Visit (HOSPITAL_COMMUNITY): Payer: BLUE CROSS/BLUE SHIELD

## 2016-10-31 ENCOUNTER — Other Ambulatory Visit (HOSPITAL_COMMUNITY): Payer: BLUE CROSS/BLUE SHIELD

## 2016-11-01 ENCOUNTER — Other Ambulatory Visit (HOSPITAL_COMMUNITY): Payer: Self-pay | Admitting: Medical

## 2016-11-02 ENCOUNTER — Other Ambulatory Visit (HOSPITAL_COMMUNITY): Payer: BLUE CROSS/BLUE SHIELD

## 2016-11-03 ENCOUNTER — Other Ambulatory Visit (HOSPITAL_COMMUNITY): Payer: BLUE CROSS/BLUE SHIELD

## 2016-11-07 ENCOUNTER — Other Ambulatory Visit (HOSPITAL_COMMUNITY): Payer: BLUE CROSS/BLUE SHIELD

## 2016-11-09 ENCOUNTER — Other Ambulatory Visit (HOSPITAL_COMMUNITY): Payer: BLUE CROSS/BLUE SHIELD

## 2016-11-25 DIAGNOSIS — F3342 Major depressive disorder, recurrent, in full remission: Secondary | ICD-10-CM | POA: Diagnosis not present

## 2016-11-25 DIAGNOSIS — F410 Panic disorder [episodic paroxysmal anxiety] without agoraphobia: Secondary | ICD-10-CM | POA: Diagnosis not present

## 2016-11-27 ENCOUNTER — Other Ambulatory Visit: Payer: Self-pay | Admitting: Family

## 2016-11-28 ENCOUNTER — Other Ambulatory Visit (HOSPITAL_COMMUNITY): Payer: Self-pay | Admitting: Medical

## 2016-11-28 DIAGNOSIS — H6692 Otitis media, unspecified, left ear: Secondary | ICD-10-CM | POA: Diagnosis not present

## 2016-11-29 NOTE — Telephone Encounter (Signed)
I just saw her acutely for fractured shoulder, have not done well stuff.

## 2016-11-29 NOTE — Telephone Encounter (Signed)
Passing to PCP, last med visit 03/12/16 by Evelina Dun

## 2016-12-26 ENCOUNTER — Telehealth: Payer: Self-pay | Admitting: Family

## 2016-12-26 NOTE — Telephone Encounter (Signed)
spoke with pt regarding dizziness Pt needs to be evaluated Offered appt for tomorrow Pt will call back to schedule

## 2016-12-26 NOTE — Telephone Encounter (Signed)
What symptoms do you have? Dizzy spells, head not feeling right almost fell out 4 times yesterday  How long have you been sick? Yesterday morning  Have you been seen for this problem? no  If your provider decides to give you a prescription, which pharmacy would you like for it to be sent to? [facility]   Patient informed that this information will be sent to the clinical staff for review and that they should receive a follow up call.

## 2016-12-27 ENCOUNTER — Encounter: Payer: Self-pay | Admitting: Physician Assistant

## 2016-12-27 ENCOUNTER — Ambulatory Visit (INDEPENDENT_AMBULATORY_CARE_PROVIDER_SITE_OTHER): Payer: BLUE CROSS/BLUE SHIELD | Admitting: Physician Assistant

## 2016-12-27 VITALS — BP 137/89 | HR 99 | Temp 99.1°F | Ht 59.0 in | Wt 104.8 lb

## 2016-12-27 DIAGNOSIS — R42 Dizziness and giddiness: Secondary | ICD-10-CM

## 2016-12-27 MED ORDER — MECLIZINE HCL 25 MG PO TABS: 25.0000 mg | ORAL_TABLET | Freq: Three times a day (TID) | ORAL | 0 refills | Status: DC | PRN

## 2016-12-27 MED ORDER — ESOMEPRAZOLE MAGNESIUM 20 MG PO CPDR: 20.0000 mg | DELAYED_RELEASE_CAPSULE | Freq: Every day | ORAL | 3 refills | Status: DC

## 2016-12-27 MED ORDER — PREDNISONE 10 MG (21) PO TBPK: ORAL_TABLET | ORAL | 0 refills | Status: DC

## 2016-12-27 NOTE — Progress Notes (Signed)
BP 137/89   Pulse 99   Temp 99.1 F (37.3 C) (Oral)   Ht 4\' 11"  (1.499 m)   Wt 104 lb 12.8 oz (47.5 kg)   LMP 08/09/2013   BMI 21.17 kg/m    Subjective:    Patient ID: [name], female    DOB: Feb 05, 1961, 56 y.o.   MRN: [id number]  HPI: [name] is a 56 y.o. female presenting on 12/27/2016 for Dizziness (since Sunday )  Three days of dizziness, worsening with movement and positional changes. Denies fever or chills. Some nausea, no vomiting.  Most notable is dizziness when turning her head.  Mild congestion and post nasal drainage.  Relevant past medical, surgical, family and social history reviewed and updated as indicated. Allergies and medications reviewed and updated.  Past Medical History:  Diagnosis Date  . Alcohol dependence with alcohol-induced mood disorder ([place]) 07/2016  . Chest pain   . Depression   . GERD (gastroesophageal reflux disease)   . HTN (hypertension)   . Seizures ([place])     Past Surgical History:  Procedure Laterality Date  . FOOT SURGERY Right 9/10   right foot-bunion-hammertoe  . Lapidus fusion Left 08/02/12  . METATARSAL OSTEOTOMY WITH BUNIONECTOMY Left 08/02/12   McBride  . TUBAL LIGATION    . Tubes in ears      Review of Systems  Constitutional: Negative.  Negative for activity change, chills, diaphoresis, fatigue and fever.  HENT: Negative.   Eyes: Negative.   Respiratory: Negative.  Negative for cough, shortness of breath and wheezing.   Cardiovascular: Negative.  Negative for chest pain and palpitations.  Gastrointestinal: Positive for diarrhea and nausea. Negative for abdominal pain.  Endocrine: Negative.   Genitourinary: Negative.  Negative for dysuria.  Musculoskeletal: Negative.   Skin: Negative.   Neurological: Positive for dizziness and light-headedness. Negative for seizures, syncope, facial asymmetry, weakness, numbness and headaches.    Allergies as of 12/27/2016      Reactions   Amlodipine Swelling   Ankle  swelling   Cortisporin [neomycin-polymyxin-hc] Itching, Rash      Medication List       Accurate as of 12/27/16  8:49 AM. Always use your most recent med list.          busPIRone 10 MG tablet Commonly known as:  BUSPAR Take 1-2 tablets three times daily for anxiety   escitalopram 20 MG tablet Commonly known as:  LEXAPRO Take 30 mg by mouth daily.   esomeprazole 20 MG capsule Commonly known as:  NEXIUM Take 1 capsule (20 mg total) by mouth daily.   hydrOXYzine 25 MG tablet Commonly known as:  ATARAX/VISTARIL Take 1-2 tablets PRN for anxiety   lamoTRIgine 100 MG tablet Commonly known as:  LAMICTAL Take 1 tablet (100 mg total) by mouth daily.   meclizine 25 MG tablet Commonly known as:  ANTIVERT Take 1 tablet (25 mg total) by mouth 3 (three) times daily as needed for dizziness.   MULTI VITAMIN PO Take 1 tablet by mouth daily.   predniSONE 10 MG (21) Tbpk tablet Commonly known as:  STERAPRED UNI-PAK 21 TAB As directed x 6 days   trazodone 300 MG tablet Commonly known as:  DESYREL Take 1 tablet (300 mg total) by mouth at bedtime.          Objective:    BP 137/89   Pulse 99   Temp 99.1 F (37.3 C) (Oral)   Ht 4\' 11"  (1.499 m)   Wt 104  lb 12.8 oz (47.5 kg)   LMP 08/09/2013   BMI 21.17 kg/m   Allergies  Allergen Reactions  . Amlodipine Swelling    Ankle swelling   . Cortisporin [Neomycin-Polymyxin-Hc] Itching and Rash    Physical Exam  Constitutional: She is oriented to person, place, and time. She appears well-developed and well-nourished.  HENT:  Head: Normocephalic and atraumatic.  Right Ear: A middle ear effusion is present.  Left Ear: A middle ear effusion is present.  Nose: Mucosal edema present.  Mouth/Throat: Posterior oropharyngeal erythema present.  Eyes: Conjunctivae and EOM are normal. Pupils are equal, round, and reactive to light.  Cardiovascular: Normal rate, regular rhythm, normal heart sounds and intact distal pulses.     Pulmonary/Chest: Effort normal and breath sounds normal.  Abdominal: Soft. Bowel sounds are normal.  Neurological: She is alert and oriented to person, place, and time. She has normal reflexes.  Skin: Skin is warm and dry. No rash noted.  Psychiatric: She has a normal mood and affect. Her behavior is normal. Judgment and thought content normal.  Nursing note and vitals reviewed.       Assessment & Plan:   1. Vertigo - meclizine (ANTIVERT) 25 MG tablet; Take 1 tablet (25 mg total) by mouth 3 (three) times daily as needed for dizziness.  Dispense: 30 tablet; Refill: 0 - predniSONE (STERAPRED UNI-PAK 21 TAB) 10 MG (21) TBPK tablet; As directed x 6 days  Dispense: 21 tablet; Refill: 0   Continue all other maintenance medications as listed above.  Follow up plan: Return if symptoms worsen or fail to improve.  Educational handout given for vertigo  Terald Sleeper PA-C [facility] [address]  [place], [place] [address] [phone]   12/27/2016, 8:49 AM

## 2016-12-27 NOTE — Patient Instructions (Signed)
Labyrinthitis Labyrinthitis is an infection of the inner ear. Your inner ear is a system of tubes and canals (labyrinth). These are filled with fluid. Nerve cells in your inner ear send signals for hearing and balance to your brain. When tiny germs get inside the tubes and canals, they harm the cells that send messages to the brain. This can cause changes in hearing and balance. Follow these instructions at home:  Take medicines only as told by your doctor.  If you were prescribed an antibiotic medicine, finish all of it even if you start to feel better.  Rest as much as possible.  Avoid loud noises and bright lights.  Do not make sudden movements until any dizziness goes away.  Do not drive until your doctor says that you can.  Drink enough fluid to keep your pee (urine) clear or pale yellow.  Work with a physical therapist if you still feel dizzy after several weeks. A therapist can teach you exercises to help you deal with your dizziness.  Keep all follow-up visits as told by your doctor. This is important. Contact a doctor if:  Your symptoms do not get better with medicines.  You do not get better after two weeks.  You have a fever. Get help right away if:  You are very dizzy.  You keep throwing up (vomiting) or keep feeling sick to your stomach (nauseous).  Your hearing gets a lot worse very quickly. This information is not intended to replace advice given to you by your health care provider. Make sure you discuss any questions you have with your health care provider. Document Released: 07/25/2005 Document Revised: 12/31/2015 Document Reviewed: 05/06/2014 Elsevier Interactive Patient Education  2017 [employer].

## 2017-01-05 ENCOUNTER — Telehealth: Payer: Self-pay | Admitting: Family

## 2017-01-06 ENCOUNTER — Other Ambulatory Visit (HOSPITAL_COMMUNITY): Payer: Self-pay | Admitting: Medical

## 2017-01-06 NOTE — Telephone Encounter (Signed)
Pt states she has one scheduled through work and will get them to send report

## 2017-01-14 IMAGING — CT CT HEAD W/O CM
4 of 5 series · 13 of 47 positions shown, 14 images · non-contrast
Comparison: None.

CLINICAL DATA: Status post fall, with laceration at the right
orbit. Headache and bilateral neck stiffness. Altered mental status.
Initial encounter.

EXAM:
CT HEAD WITHOUT CONTRAST
CT CERVICAL SPINE WITHOUT CONTRAST
TECHNIQUE: Multidetector CT imaging of the head and cervical spine was
performed following the standard protocol without intravenous
contrast. Multiplanar CT image reconstructions of the cervical spine
were also generated.

[Series 2: headseq 4.8 h37s · axial · 0.47mm/px · z∈[+1027,+1114]mm · 3 of 36 slices shown, 4 images]
[im 9/36  brain]
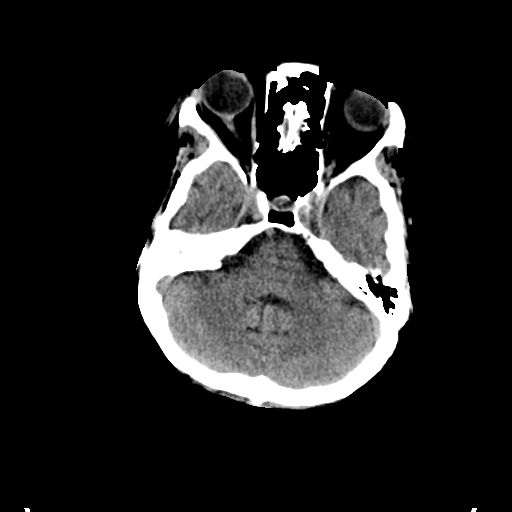
[im 9/36  bone]
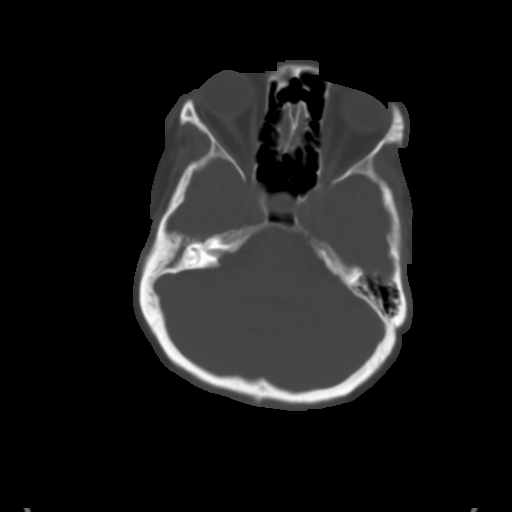
[im 18/36  brain]
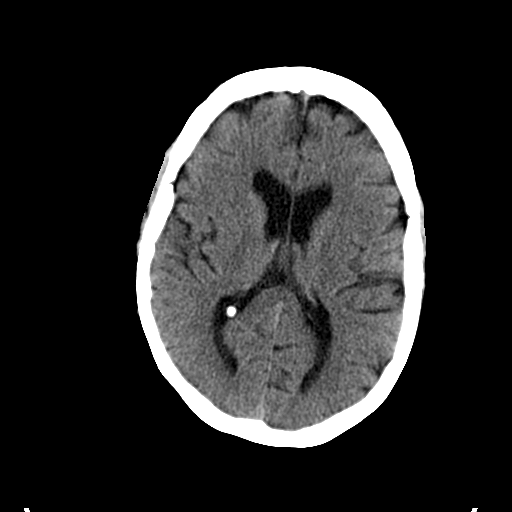
[im 27/36  brain]
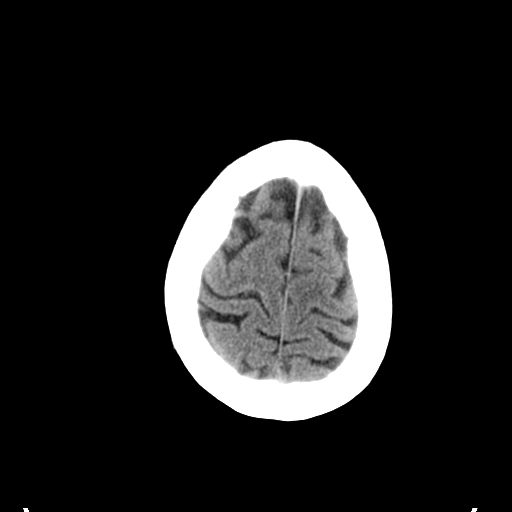

[Series 7: sagittal bone 2.0 · sagittal · 0.22mm/px · 3 of 60 slices shown]
[im 20/60  brain]
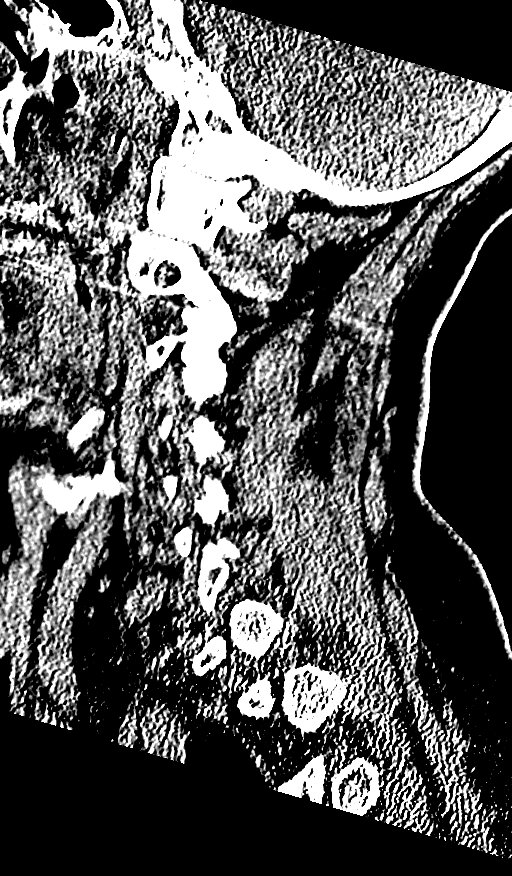
[im 30/60  brain]
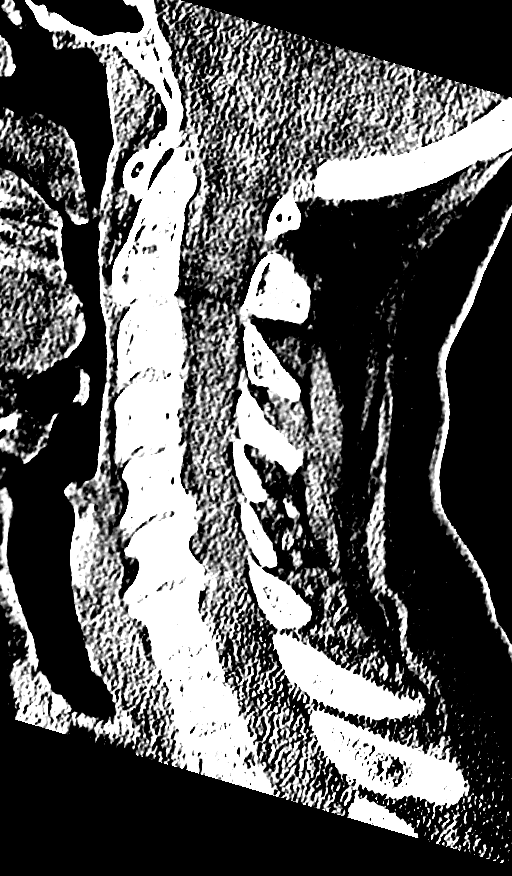
[im 40/60  brain]
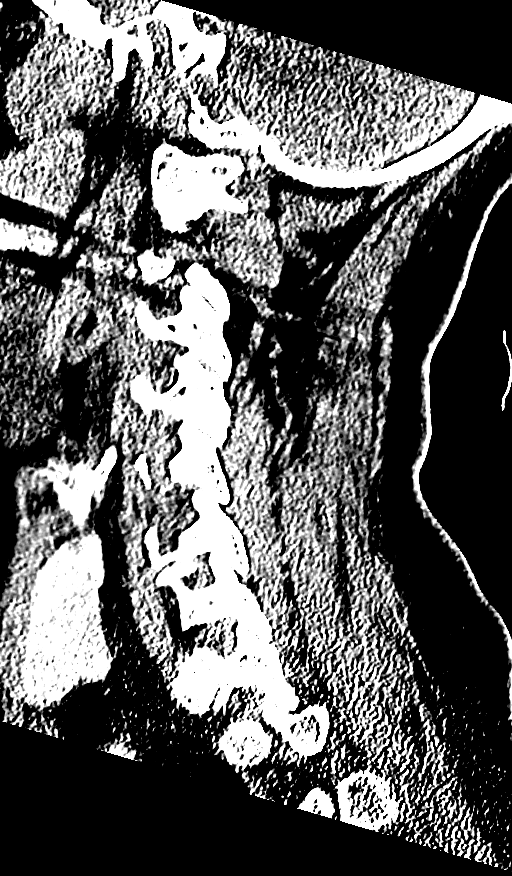

[Series 8: coronal bone 2.0 · coronal · 0.22mm/px · 3 of 53 slices shown]
[im 18/53  brain]
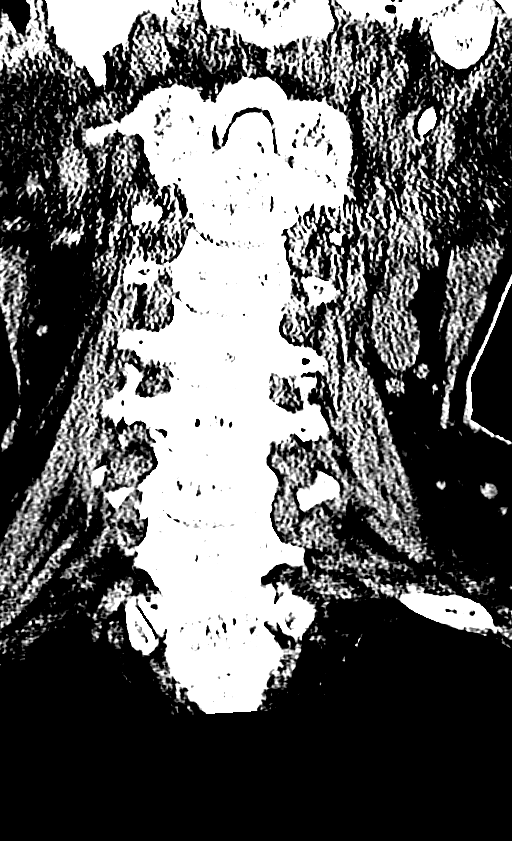
[im 24/53  brain]
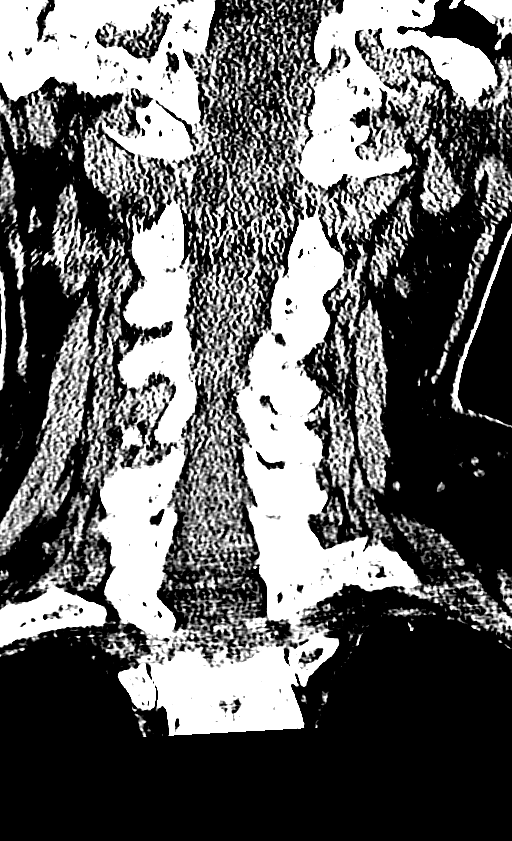
[im 29/53  brain]
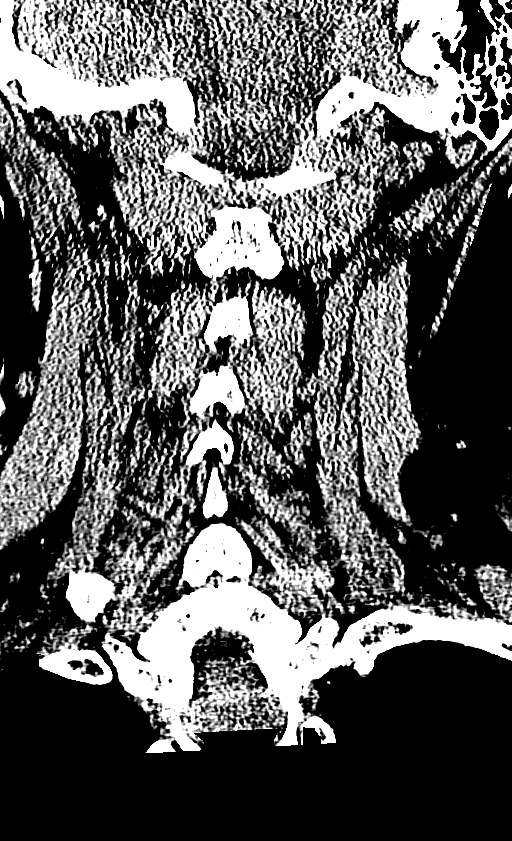

[Series 9: axial bone 2.0 · axial · 0.21mm/px · z∈[+838,+891]mm · 4 of 86 slices shown]
[im 8/86  bone]
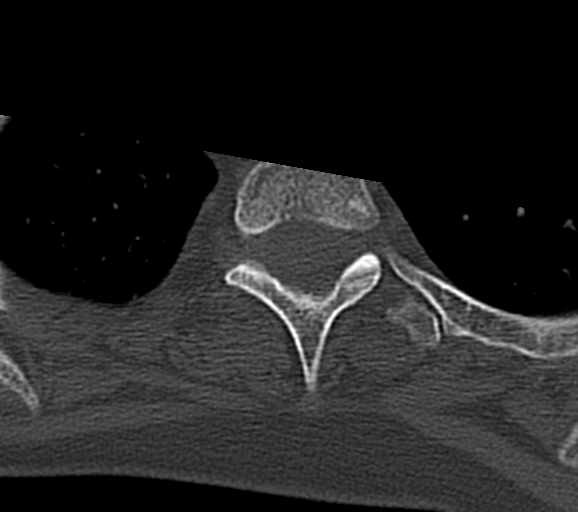
[im 22/86  bone]
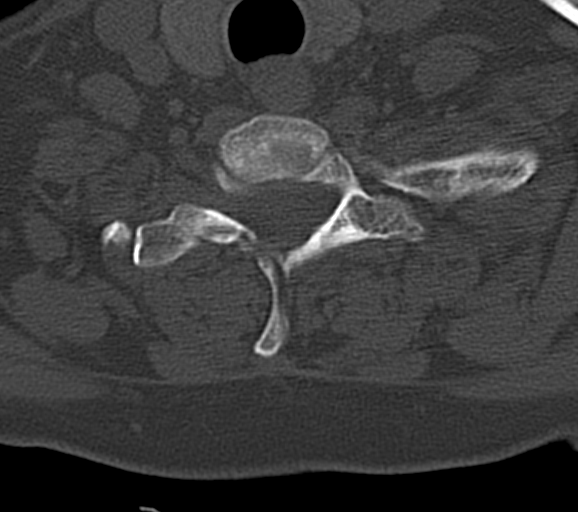
[im 29/86  bone]
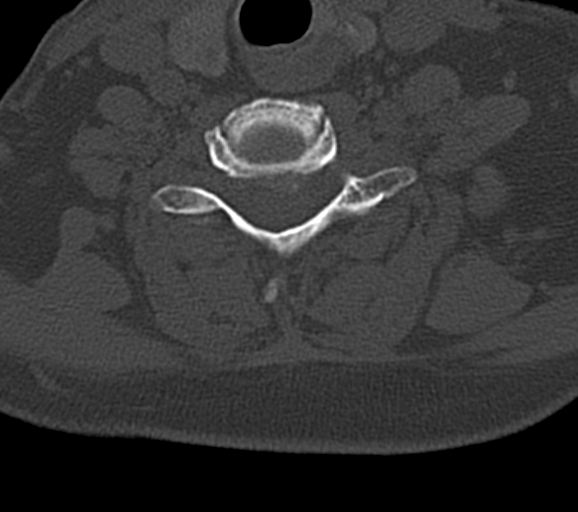
[im 36/86  bone]
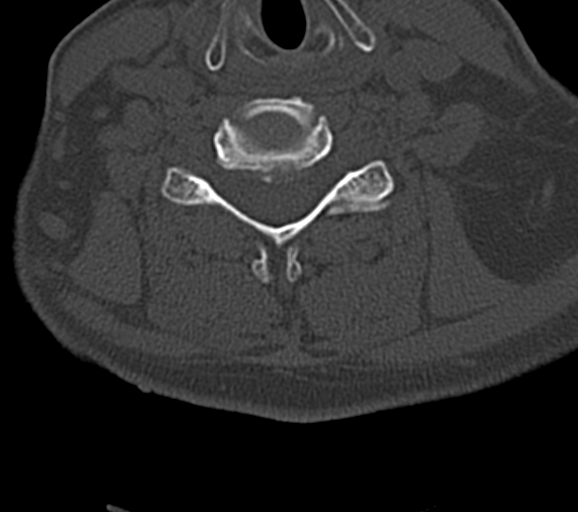

[13 of 47 positions shown; findings below may reference images not displayed]

FINDINGS: CT HEAD FINDINGS

There is no evidence of acute infarction, mass lesion, or intra- or
extra-axial hemorrhage on CT.

The posterior fossa, including the cerebellum, brainstem and fourth
ventricle, is within normal limits. The third and lateral
ventricles, and basal ganglia are unremarkable in appearance. The
cerebral hemispheres are symmetric in appearance, with normal
gray-white differentiation. No mass effect or midline shift is seen.

There is no evidence of fracture; visualized osseous structures are
unremarkable in appearance. The visualized portions of the orbits
are within normal limits. There is complete opacification of the
right mastoid air cells. The paranasal sinuses and left mastoid air
cells are well-aerated. Soft tissue swelling is noted lateral to the
right orbit.

CT CERVICAL SPINE FINDINGS

There is no evidence of fracture or subluxation. Vertebral bodies
demonstrate normal height and alignment. Minimal disc space
narrowing is noted along the lower cervical spine, with anterior and
posterior disc osteophyte complexes. Prevertebral soft tissues are
within normal limits. The visualized neural foramina are grossly
unremarkable.

The thyroid gland is unremarkable in appearance. The visualized lung
apices are clear. No significant soft tissue abnormalities are seen.
IMPRESSION: 1. No evidence of traumatic intracranial injury or fracture.
2. No evidence of fracture or subluxation along the cervical spine.
3. Soft tissue swelling noted lateral to the right orbit.
4. Complete opacification of the right mastoid air cells.
5. Minimal degenerative change at the lower cervical spine.

## 2017-01-18 DIAGNOSIS — R634 Abnormal weight loss: Secondary | ICD-10-CM | POA: Diagnosis not present

## 2017-01-18 DIAGNOSIS — F330 Major depressive disorder, recurrent, mild: Secondary | ICD-10-CM | POA: Diagnosis not present

## 2017-01-19 DIAGNOSIS — Z1231 Encounter for screening mammogram for malignant neoplasm of breast: Secondary | ICD-10-CM | POA: Diagnosis not present

## 2017-01-23 DIAGNOSIS — R634 Abnormal weight loss: Secondary | ICD-10-CM | POA: Diagnosis not present

## 2017-01-26 DIAGNOSIS — G5603 Carpal tunnel syndrome, bilateral upper limbs: Secondary | ICD-10-CM | POA: Diagnosis not present

## 2017-01-26 DIAGNOSIS — M5412 Radiculopathy, cervical region: Secondary | ICD-10-CM | POA: Diagnosis not present

## 2017-01-26 DIAGNOSIS — R202 Paresthesia of skin: Secondary | ICD-10-CM | POA: Diagnosis not present

## 2017-01-26 DIAGNOSIS — M5417 Radiculopathy, lumbosacral region: Secondary | ICD-10-CM | POA: Diagnosis not present

## 2017-03-24 DIAGNOSIS — H6690 Otitis media, unspecified, unspecified ear: Secondary | ICD-10-CM | POA: Diagnosis not present

## 2017-03-24 DIAGNOSIS — H9211 Otorrhea, right ear: Secondary | ICD-10-CM | POA: Diagnosis not present

## 2017-04-20 ENCOUNTER — Other Ambulatory Visit: Payer: Self-pay | Admitting: Family Medicine

## 2017-04-20 MED ORDER — CIPROFLOXACIN-DEXAMETHASONE 0.3-0.1 % OT SUSP: 4.0000 [drp] | Freq: Two times a day (BID) | OTIC | 0 refills | Status: DC

## 2017-04-20 NOTE — Progress Notes (Signed)
Patient has right otitis media with tubes into place, will send Ciprodex for her

## 2017-04-21 DIAGNOSIS — E860 Dehydration: Secondary | ICD-10-CM | POA: Diagnosis not present

## 2017-04-21 DIAGNOSIS — R197 Diarrhea, unspecified: Secondary | ICD-10-CM | POA: Diagnosis not present

## 2017-04-25 ENCOUNTER — Ambulatory Visit (INDEPENDENT_AMBULATORY_CARE_PROVIDER_SITE_OTHER): Payer: BLUE CROSS/BLUE SHIELD | Admitting: Family

## 2017-04-25 ENCOUNTER — Encounter: Payer: Self-pay | Admitting: Family

## 2017-04-25 VITALS — BP 120/87 | HR 81 | Temp 99.3°F | Ht 59.0 in | Wt 108.6 lb

## 2017-04-25 DIAGNOSIS — R197 Diarrhea, unspecified: Secondary | ICD-10-CM | POA: Diagnosis not present

## 2017-04-25 NOTE — Progress Notes (Signed)
   Subjective:    Patient ID: [name], female    DOB: Dec 27, 1960, 56 y.o.   MRN: [id number]  Pt presents to the office today with diarrhea that started on 04/20/17. PT states she went to her NP at work on 04/21/17 and received IV fluids and felt much better. She reports the diarrhea had improved, until yesterday 04/24/17. Now she reports >5 stools today. Denies any recent antibiotics or hospitalizations.  Diarrhea   This is a new problem. The current episode started in the past 7 days. The problem occurs 5 to 10 times per day. The problem has been waxing and waning. The stool consistency is described as watery. The patient states that diarrhea awakens her from sleep. Associated symptoms include chills, a fever (low grade 99) and myalgias. Pertinent negatives include no bloating, increased  flatus or vomiting. Associated symptoms comments: Nausea . There are no known risk factors. She has tried anti-motility drug for the symptoms. The treatment provided mild relief.      Review of Systems  Constitutional: Positive for chills and fever (low grade 99).  Gastrointestinal: Positive for diarrhea. Negative for bloating, flatus and vomiting.  Musculoskeletal: Positive for myalgias.  All other systems reviewed and are negative.      Objective:   Physical Exam  Constitutional: She is oriented to person, place, and time. She appears well-developed and well-nourished. No distress.  HENT:  Head: Normocephalic.  Eyes: Pupils are equal, round, and reactive to light.  Neck: Normal range of motion. Neck supple. No thyromegaly present.  Cardiovascular: Normal rate, regular rhythm, normal heart sounds and intact distal pulses.   No murmur heard. Pulmonary/Chest: Effort normal and breath sounds normal. No respiratory distress. She has no wheezes.  Abdominal: Soft. Bowel sounds are normal. She exhibits no distension. There is tenderness (generalized ).  Musculoskeletal: Normal range of motion. She  exhibits no edema or tenderness.  Neurological: She is alert and oriented to person, place, and time.  Skin: Skin is warm and dry.  Psychiatric: She has a normal mood and affect. Her behavior is normal. Judgment and thought content normal.  Vitals reviewed.     BP 120/87   Pulse 81   Temp 99.3 F (37.4 C) (Oral)   Ht 4\' 11"  (1.499 m)   Wt 108 lb 9.6 oz (49.3 kg)   LMP 08/09/2013   BMI 21.93 kg/m      Assessment & Plan:  1. Diarrhea, unspecified type Force fluids Bland diet Imodium as needed RTO prn if symptoms worsen or do not improve - Cdiff NAA+O+P+Stool Culture    Evelina Dun, FNP

## 2017-04-25 NOTE — Patient Instructions (Signed)

## 2017-04-27 DIAGNOSIS — R197 Diarrhea, unspecified: Secondary | ICD-10-CM | POA: Diagnosis not present

## 2017-04-29 IMAGING — CR DG ANKLE COMPLETE 3+V*R*
3 series · 3 of 3 positions shown · non-contrast
Comparison: None.

CLINICAL DATA: Initial encounter for Fall today. Pt c/o all over
right ankle pain. No hx of right ankle injuries but pt does have hx
of right foot surgery x3 years ago.

EXAM:
RIGHT ANKLE - COMPLETE 3+ VIEW

[ankle ap]
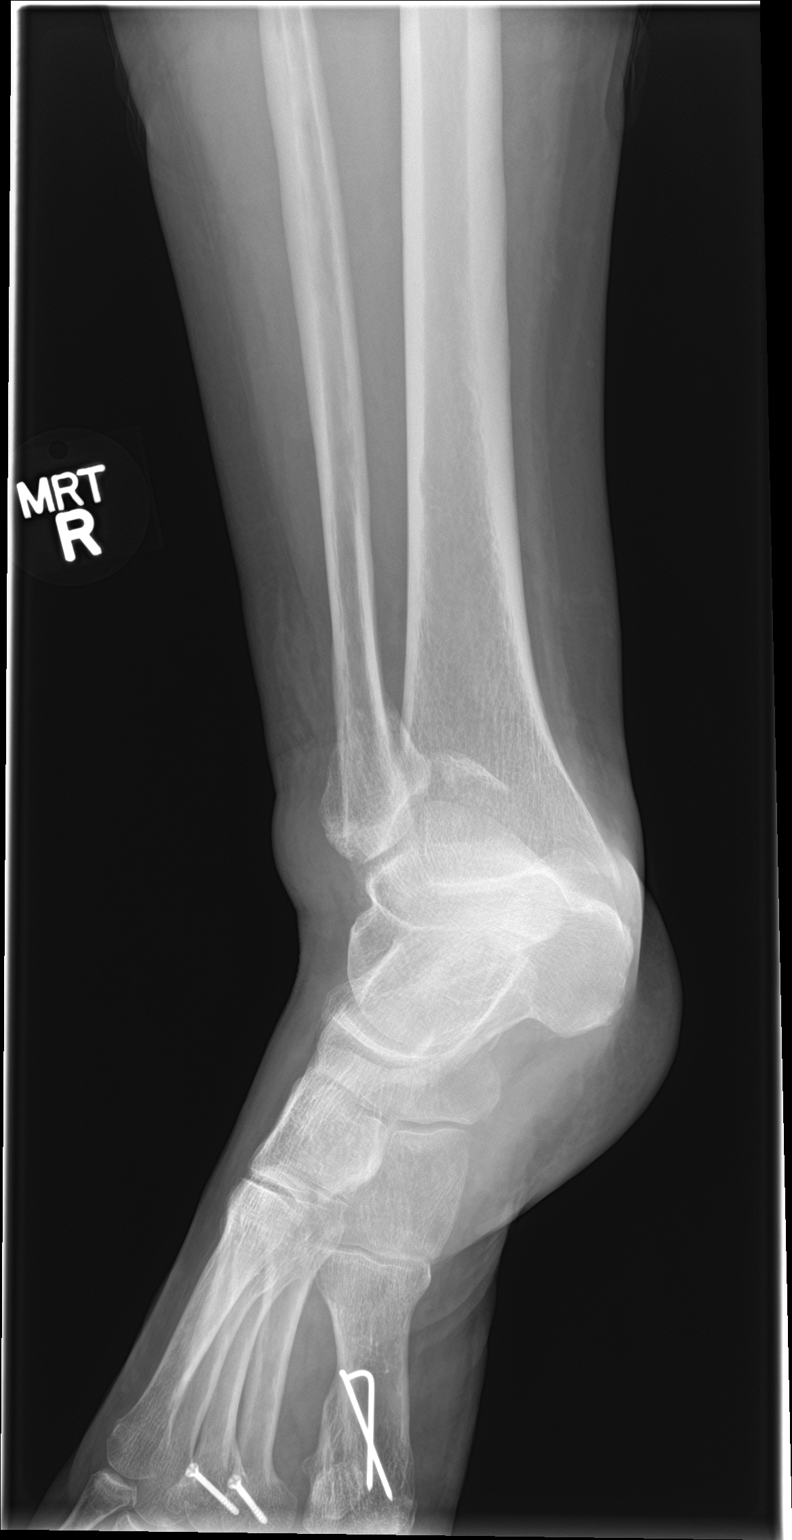

[ankle obl]
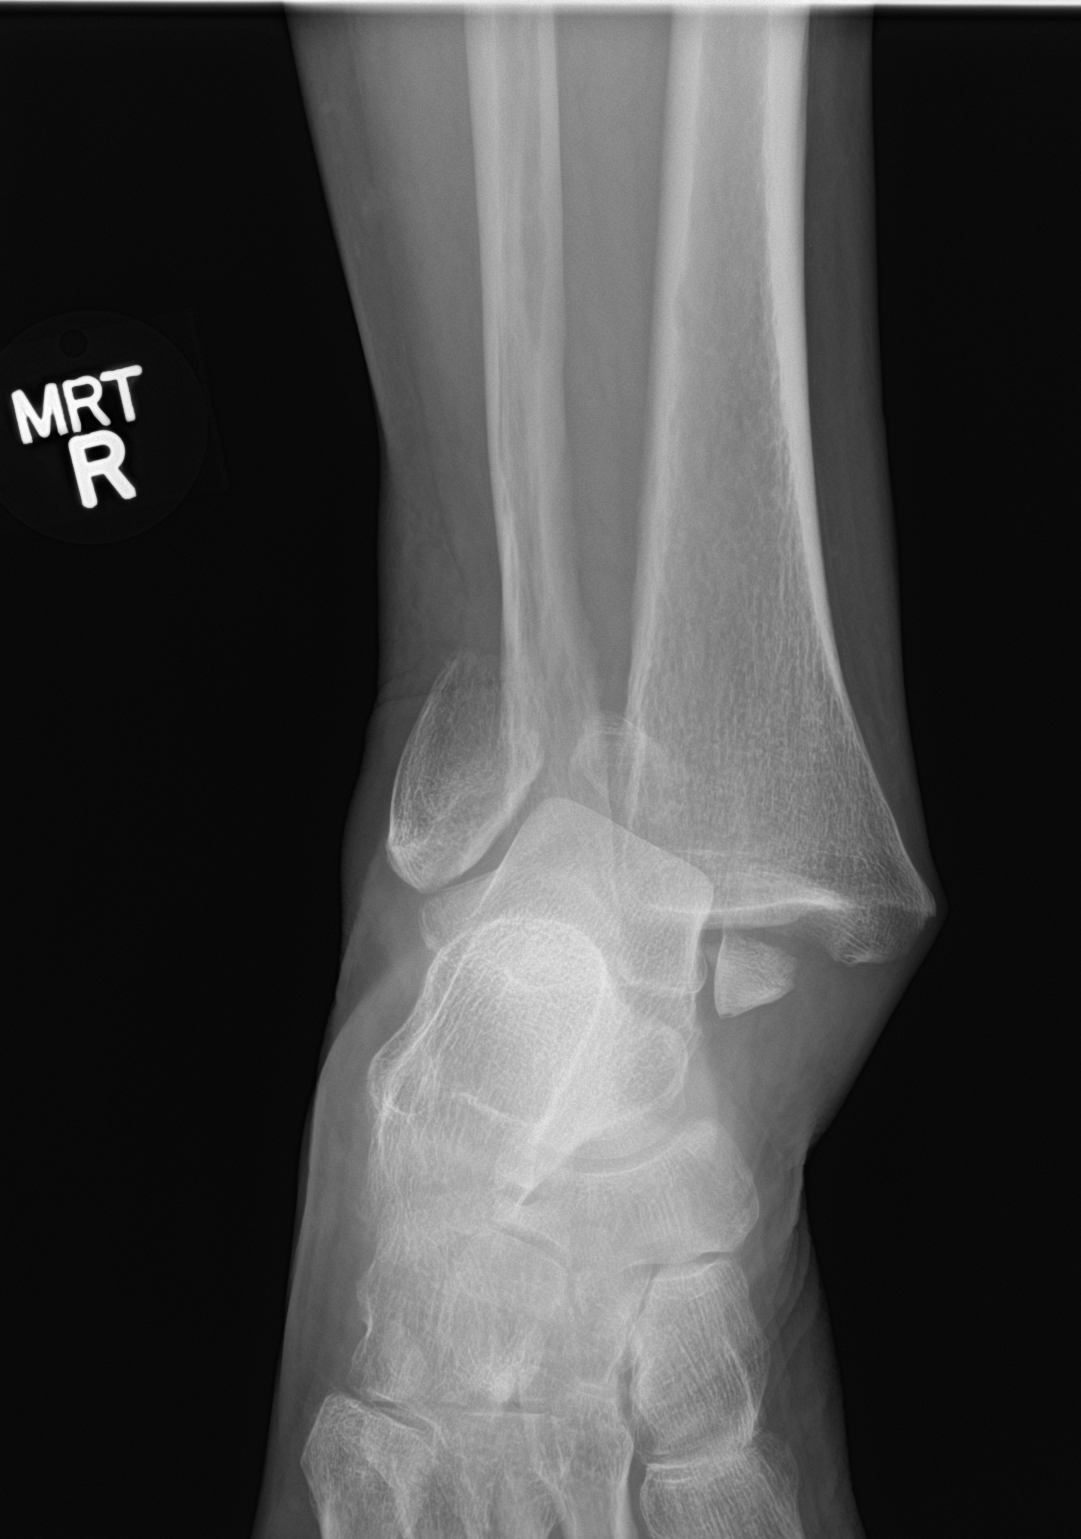

[ankle lat]
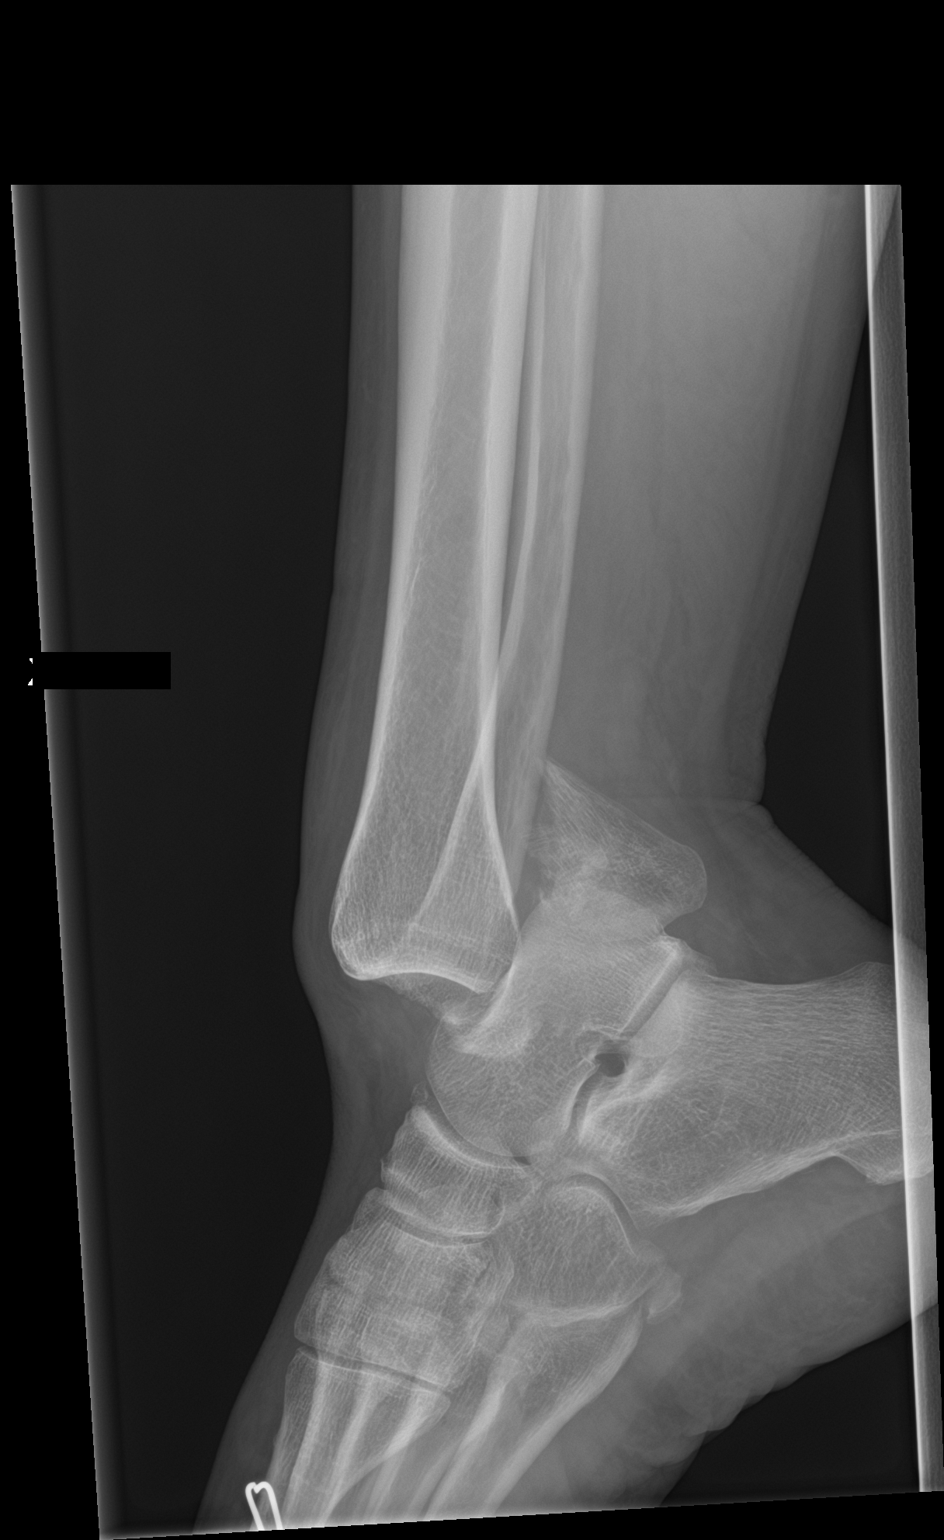

[3 of 3 positions shown; findings below may reference images not displayed]

FINDINGS: Bimalleolar soft tissue swelling. Fracture dislocation of the ankle,
with the talus positioned lateral and posterior to the distal tibia.
Transverse fracture involving the medial malleolus. Oblique fracture
involving the distal fibula. There is also a probable oblique
fracture involving the distal posterior tibia.

Base of fifth metatarsal intact. Incompletely imaged metatarsal
fixation.
IMPRESSION: Fracture dislocation involving the ankle, as above. Overlying soft
tissue swelling.

## 2017-05-02 NOTE — Progress Notes (Signed)
Patient aware.

## 2017-05-03 ENCOUNTER — Telehealth: Payer: Self-pay | Admitting: Family

## 2017-05-03 NOTE — Telephone Encounter (Signed)
Patient seen [name] 9/19 for diarrhea. Had started the 13 and EMS gave her fluid on the 14th. Patient states that it had went away by 9/20 and just started again today. States it is not that bad but she is still very weak. Please advise.

## 2017-05-03 NOTE — Telephone Encounter (Signed)
Patient aware and verbalizes understanding. 

## 2017-05-03 NOTE — Telephone Encounter (Signed)
Patient states she is real dehydrated and has diarrhea wants to know what she can do?

## 2017-05-03 NOTE — Telephone Encounter (Signed)
Pt given recommendations on BRAT diet and staying hydrated Pt verbalizes understanding

## 2017-05-03 NOTE — Telephone Encounter (Signed)
Pt's C Diff was negative, but she did not leave enough stool for the other tests. If symptoms continues she can repeat tests. Stay hydration. If worsens needs to be seen.

## 2017-05-05 ENCOUNTER — Ambulatory Visit (INDEPENDENT_AMBULATORY_CARE_PROVIDER_SITE_OTHER): Payer: BLUE CROSS/BLUE SHIELD | Admitting: Physician Assistant

## 2017-05-05 ENCOUNTER — Encounter: Payer: Self-pay | Admitting: Physician Assistant

## 2017-05-05 VITALS — Temp 98.0°F | Ht 59.0 in | Wt 105.0 lb

## 2017-05-05 DIAGNOSIS — A09 Infectious gastroenteritis and colitis, unspecified: Secondary | ICD-10-CM | POA: Diagnosis not present

## 2017-05-05 DIAGNOSIS — R531 Weakness: Secondary | ICD-10-CM

## 2017-05-05 LAB — CDIFF NAA+O+P+STOOL CULTURE: Toxigenic C. Difficile by PCR: NEGATIVE

## 2017-05-05 NOTE — Patient Instructions (Signed)
In a few days you may receive a survey in the mail or online from [employer] regarding your visit with us today. Please take a moment to fill this out. Your feedback is very important to our whole office. It can help us better understand your needs as well as improve your experience and satisfaction. Thank you for taking your time to complete it. We care about you.  Kordel Leavy, PA-C  

## 2017-05-05 NOTE — Progress Notes (Signed)
Temp 98 F (36.7 C) (Oral)   Ht 4' 11"  (1.499 m)   Wt 105 lb (47.6 kg)   LMP 08/09/2013   BMI 21.21 kg/m    Subjective:    Patient ID: [name], female    DOB: 12/30/1960, 56 y.o.   MRN: [id number]  HPI: [name] is a 56 y.o. female presenting on 05/05/2017 for Fatigue (Needs note to be out of work until she is feeling better)  Almost 3 weeks ago the patient experienced a severe day of diarrhea. She was at work and was going every 30 minutes. The nurse saw her there and could tell she was quite sick. She had gotten dehydrated during this time. They were able to treat her with IV fluids. She has missed some work due to the chronicity of the diarrhea. She at this time is having a lot of weakness. She reports the diarrhea seems to be slightly less but she is not eating very much. She denies any fever or chills at this time. She is not experiencing nausea or vomiting at this time. There is no one else in the house but has had the same symptoms. She denies any excessive alcohol use. She works 12 hour shifts at [employer] and has not had endurance to make it through her workday.  Relevant past medical, surgical, family and social history reviewed and updated as indicated. Allergies and medications reviewed and updated.  Past Medical History:  Diagnosis Date  . Alcohol dependence with alcohol-induced mood disorder ([place]) 07/2016  . Chest pain   . Depression   . GERD (gastroesophageal reflux disease)   . HTN (hypertension)   . Seizures ([place])     Past Surgical History:  Procedure Laterality Date  . FOOT SURGERY Right 9/10   right foot-bunion-hammertoe  . Lapidus fusion Left 08/02/12  . METATARSAL OSTEOTOMY WITH BUNIONECTOMY Left 08/02/12   [facility]  . TUBAL LIGATION    . Tubes in ears      Review of Systems  Constitutional: Positive for appetite change and fatigue. Negative for activity change, chills, diaphoresis and fever.  HENT: Negative.  Negative for congestion, ear pain, sinus  pain and sore throat.   Eyes: Negative.   Respiratory: Negative.  Negative for cough, shortness of breath and wheezing.   Cardiovascular: Negative.  Negative for chest pain, palpitations and leg swelling.  Gastrointestinal: Positive for abdominal pain and diarrhea. Negative for abdominal distention, blood in stool, constipation, nausea and rectal pain.  Endocrine: Negative.   Genitourinary: Negative.  Negative for difficulty urinating and dysuria.  Musculoskeletal: Positive for arthralgias and myalgias. Negative for joint swelling, neck pain and neck stiffness.  Skin: Negative.   Neurological: Positive for weakness.  Hematological: Negative.     Allergies as of 05/05/2017      Reactions   Amlodipine Swelling   Ankle swelling   Cortisporin [neomycin-polymyxin-hc] Itching, Rash      Medication List       Accurate as of 05/05/17 12:33 PM. Always use your most recent med list.          busPIRone 10 MG tablet Commonly known as:  BUSPAR Take 1-2 tablets three times daily for anxiety   escitalopram 20 MG tablet Commonly known as:  LEXAPRO Take 30 mg by mouth daily.   esomeprazole 20 MG capsule Commonly known as:  NEXIUM Take 1 capsule (20 mg total) by mouth daily.   hydrOXYzine 25 MG tablet Commonly known as:  ATARAX/VISTARIL Take 1-2 tablets  PRN for anxiety   lamoTRIgine 100 MG tablet Commonly known as:  LAMICTAL Take 1 tablet (100 mg total) by mouth daily.   MULTI VITAMIN PO Take 1 tablet by mouth daily.   trazodone 300 MG tablet Commonly known as:  DESYREL Take 1 tablet (300 mg total) by mouth at bedtime.            Discharge Care Instructions        Start     Ordered   05/05/17 0000  CBC with Differential/Platelet     05/05/17 1053   05/05/17 0000  CMP14+EGFR     05/05/17 1053   05/05/17 0000  Iron     05/05/17 1055   05/05/17 0000  Vitamin B12     05/05/17 1055         Objective:    Temp 98 F (36.7 C) (Oral)   Ht 4' 11"  (1.499 m)   Wt 105  lb (47.6 kg)   LMP 08/09/2013   BMI 21.21 kg/m   Allergies  Allergen Reactions  . Amlodipine Swelling    Ankle swelling   . Cortisporin [Neomycin-Polymyxin-Hc] Itching and Rash    Physical Exam  Constitutional: She is oriented to person, place, and time. She appears well-developed and well-nourished.  HENT:  Head: Normocephalic and atraumatic.  Right Ear: Tympanic membrane, external ear and ear canal normal.  Left Ear: Tympanic membrane, external ear and ear canal normal.  Nose: Nose normal. No rhinorrhea.  Mouth/Throat: Oropharynx is clear and moist and mucous membranes are normal. No oropharyngeal exudate or posterior oropharyngeal erythema.  Eyes: Pupils are equal, round, and reactive to light. Conjunctivae and EOM are normal.  Neck: Normal range of motion. Neck supple.  Cardiovascular: Normal rate, regular rhythm, normal heart sounds and intact distal pulses.   Pulmonary/Chest: Effort normal and breath sounds normal.  Abdominal: Soft. Bowel sounds are increased. There is no tenderness. There is CVA tenderness.  Neurological: She is alert and oriented to person, place, and time. She has normal reflexes.  Skin: Skin is warm and dry. No rash noted.  Psychiatric: She has a normal mood and affect. Her behavior is normal. Judgment and thought content normal.    Results for orders placed or performed in visit on 04/25/17  Cdiff NAA+O+P+Stool Culture  Result Value Ref Range   Salmonella/Shigella Screen CANCELED    Campylobacter Culture CANCELED    E coli, Shiga toxin Assay CANCELED    OVA + PARASITE EXAM WILL FOLLOW    Toxigenic C Difficile by pcr Negative Negative      Assessment & Plan:   1. Diarrhea of infectious origin - CBC with Differential/Platelet - CMP14+EGFR - Iron - Vitamin B12  2. Weakness - CBC with Differential/Platelet - CMP14+EGFR - Iron - Vitamin B12    Current Outpatient Prescriptions:  .  busPIRone (BUSPAR) 10 MG tablet, Take 1-2 tablets three  times daily for anxiety, Disp: 540 tablet, Rfl: 0 .  escitalopram (LEXAPRO) 20 MG tablet, Take 30 mg by mouth daily., Disp: , Rfl:  .  esomeprazole (NEXIUM) 20 MG capsule, Take 1 capsule (20 mg total) by mouth daily., Disp: 90 capsule, Rfl: 3 .  hydrOXYzine (ATARAX/VISTARIL) 25 MG tablet, Take 1-2 tablets PRN for anxiety, Disp: 180 tablet, Rfl: 1 .  lamoTRIgine (LAMICTAL) 100 MG tablet, Take 1 tablet (100 mg total) by mouth daily., Disp: 90 tablet, Rfl: 0 .  Multiple Vitamin (MULTI VITAMIN PO), Take 1 tablet by mouth daily. , Disp: , Rfl:  .  trazodone (DESYREL) 300 MG tablet, Take 1 tablet (300 mg total) by mouth at bedtime., Disp: 30 tablet, Rfl: 0 Continue all other maintenance medications as listed above.  Follow up plan: Return in about 10 days (around 05/15/2017) for recheck.  Educational handout given for [place] PA-C [place] [address]  [place], [place] [address] [phone]   05/05/2017, 12:33 PM

## 2017-05-06 ENCOUNTER — Other Ambulatory Visit: Payer: Self-pay | Admitting: Physician Assistant

## 2017-05-06 DIAGNOSIS — R112 Nausea with vomiting, unspecified: Secondary | ICD-10-CM

## 2017-05-06 DIAGNOSIS — R1084 Generalized abdominal pain: Secondary | ICD-10-CM

## 2017-05-06 DIAGNOSIS — R109 Unspecified abdominal pain: Secondary | ICD-10-CM | POA: Insufficient documentation

## 2017-05-06 DIAGNOSIS — R1110 Vomiting, unspecified: Secondary | ICD-10-CM | POA: Insufficient documentation

## 2017-05-06 LAB — CBC WITH DIFFERENTIAL/PLATELET
BASOS: 0 %
Basophils Absolute: 0 10*3/uL (ref 0.0–0.2)
EOS (ABSOLUTE): 0 10*3/uL (ref 0.0–0.4)
Eos: 0 %
Hematocrit: 41.9 % (ref 34.0–46.6)
Hemoglobin: 13.4 g/dL (ref 11.1–15.9)
IMMATURE GRANULOCYTES: 0 %
Immature Grans (Abs): 0 10*3/uL (ref 0.0–0.1)
Lymphocytes Absolute: 1.1 10*3/uL (ref 0.7–3.1)
Lymphs: 22 %
MCH: 26.9 pg (ref 26.6–33.0)
MCHC: 32 g/dL (ref 31.5–35.7)
MCV: 84 fL (ref 79–97)
MONOS ABS: 0.3 10*3/uL (ref 0.1–0.9)
Monocytes: 6 %
NEUTROS ABS: 3.6 10*3/uL (ref 1.4–7.0)
NEUTROS PCT: 72 %
PLATELETS: 227 10*3/uL (ref 150–379)
RBC: 4.98 x10E6/uL (ref 3.77–5.28)
RDW: 15.5 % — ABNORMAL HIGH (ref 12.3–15.4)
WBC: 5.1 10*3/uL (ref 3.4–10.8)

## 2017-05-06 LAB — CMP14+EGFR
A/G RATIO: 1.7 (ref 1.2–2.2)
ALK PHOS: 61 IU/L (ref 39–117)
ALT: 14 IU/L (ref 0–32)
AST: 23 IU/L (ref 0–40)
Albumin: 4.8 g/dL (ref 3.5–5.5)
BUN/Creatinine Ratio: 20 (ref 9–23)
BUN: 12 mg/dL (ref 6–24)
Bilirubin Total: 0.4 mg/dL (ref 0.0–1.2)
CO2: 20 mmol/L (ref 20–29)
Calcium: 9.3 mg/dL (ref 8.7–10.2)
Chloride: 100 mmol/L (ref 96–106)
Creatinine, Ser: 0.59 mg/dL (ref 0.57–1.00)
GFR calc Af Amer: 119 mL/min/{1.73_m2} (ref >59)
GFR calc non Af Amer: 104 mL/min/{1.73_m2} (ref >59)
Globulin, Total: 2.8 g/dL (ref 1.5–4.5)
Glucose: 87 mg/dL (ref 65–99)
POTASSIUM: 4.5 mmol/L (ref 3.5–5.2)
Sodium: 139 mmol/L (ref 134–144)
Total Protein: 7.6 g/dL (ref 6.0–8.5)

## 2017-05-06 LAB — IRON: Iron: 74 ug/dL (ref 27–159)

## 2017-05-06 LAB — VITAMIN B12: Vitamin B-12: 340 pg/mL (ref 232–1245)

## 2017-05-08 ENCOUNTER — Telehealth: Payer: Self-pay | Admitting: Family

## 2017-05-10 ENCOUNTER — Encounter: Payer: Self-pay | Admitting: Gastroenterology

## 2017-05-10 ENCOUNTER — Ambulatory Visit (INDEPENDENT_AMBULATORY_CARE_PROVIDER_SITE_OTHER): Payer: BLUE CROSS/BLUE SHIELD | Admitting: Gastroenterology

## 2017-05-10 ENCOUNTER — Other Ambulatory Visit: Payer: Self-pay

## 2017-05-10 ENCOUNTER — Telehealth: Payer: Self-pay

## 2017-05-10 VITALS — BP 115/85 | HR 94 | Temp 99.3°F | Ht 59.5 in | Wt 104.4 lb

## 2017-05-10 DIAGNOSIS — K529 Noninfective gastroenteritis and colitis, unspecified: Secondary | ICD-10-CM

## 2017-05-10 DIAGNOSIS — R5383 Other fatigue: Secondary | ICD-10-CM

## 2017-05-10 DIAGNOSIS — K625 Hemorrhage of anus and rectum: Secondary | ICD-10-CM | POA: Diagnosis not present

## 2017-05-10 MED ORDER — PEG 3350-KCL-NA BICARB-NACL 420 G PO SOLR: 4000.0000 mL | ORAL | 0 refills | Status: DC

## 2017-05-10 NOTE — Progress Notes (Signed)
cc'ed to pcp °

## 2017-05-10 NOTE — Patient Instructions (Addendum)
I am glad the diarrhea has resolved. If it returns, call me. I am requesting the last colonoscopy report from [facility].   Please complete the stool sample when you can and return to [place].  I have ordered two additional labs today.  Continue the multivitamin as you are doing. Add the B complex vitamins as directed on the bottle. You may need additional supplements such as Vit D, and we will see what this shows (due to your surgical history).  If your stool is positive for blood, we will need to do a colonoscopy. Otherwise, we will see you in 3 months!    ADDENDUM: this AVS was given to patient, and then she reported rectal bleeding. Plans for colonoscopy. No ifobt needed.

## 2017-05-10 NOTE — Assessment & Plan Note (Signed)
56 year old with recent diarrheal illness that has now resolved and back to baseline status. CDI negative. Likely gastroenteritis.   She notes persistent fatigue since this time, although labs to include CBC and CMP are unrevealing. Will order TSH and Vit D level (history of sleeve gastrectomy in past). B12 normal. Due to low-volume chronic hematochezia, will proceed with early interval colonoscopy. Last approximately 8 years ago at [facility].  Proceed with colonoscopy with Dr. Oneida Alar in the near future. The risks, benefits, and alternatives have been discussed in detail with the patient. They state understanding and desire to proceed.  Propofol due to polypharmacy and history of alcohol Labs as discussed 3 month follow-up

## 2017-05-10 NOTE — Assessment & Plan Note (Signed)
Colonoscopy due to rectal bleeding.

## 2017-05-10 NOTE — Telephone Encounter (Signed)
Called and informed pt of pre-op appt 06/01/17 at 12:45pm. Letter mailed.

## 2017-05-10 NOTE — Progress Notes (Signed)
Primary Care Physician:  Sharion Balloon, FNP Primary Gastroenterologist:  Dr. Oneida Alar   Chief Complaint  Patient presents with  . Diarrhea    Recent episode of diarrhea and has not got her strength back    HPI:   [name] is a 56 y.o. female presenting today at the request of Sharion Balloon, FNP secondary to diarrhea.  States around the time of [facility], she had new onset diarrhea. Had to get IVFs from EMT due to dehydration. Recent stool studies with negative CDiff PCR. Stool culture appears cancelled. CBC normal. Recent CMP normal. Diarrhea has resolved but feels like she can't get her strength back. States she had gone to a wedding with her son in [place] two weeks prior and wonders if she got something there. Now with normal BMs.  No abdominal pain. Appetite is improved. No unexplained weight loss. Has a history of a sleeve gastrectomy with starting weight 192 and now remains in the low 100s. Feels tired, sleeping a lot. When she didn't have an appetite, was eating peanut butter crackers and muscle milk.   Had a colonoscopy about 8 years ago at [facility]. No polyps. No FH of colon cancer. No FH of colon polyps. States she has intermittent low-volume hematochezia occasionally, which she attributes to hemorrhoids.   Past Medical History:  Diagnosis Date  . Alcohol dependence with alcohol-induced mood disorder ([place]) 07/2016  . Chest pain   . Depression   . GERD (gastroesophageal reflux disease)   . HTN (hypertension)    resolved after sleeve  . Seizures ([place])    patient denies    Past Surgical History:  Procedure Laterality Date  . FOOT SURGERY Right 9/10   right foot-bunion-hammertoe  . Lapidus fusion Left 08/02/12  . METATARSAL OSTEOTOMY WITH BUNIONECTOMY Left 08/02/12   [facility]  . sleeve gastrectomy  03/2014   [facility]. Starting weight 192  . TUBAL LIGATION    . Tubes in ears      Current Outpatient Prescriptions  Medication Sig  Dispense Refill  . busPIRone (BUSPAR) 10 MG tablet Take 1-2 tablets three times daily for anxiety 540 tablet 0  . escitalopram (LEXAPRO) 20 MG tablet Take 30 mg by mouth daily.    Marland Kitchen esomeprazole (NEXIUM) 20 MG capsule Take 1 capsule (20 mg total) by mouth daily. 90 capsule 3  . hydrOXYzine (ATARAX/VISTARIL) 25 MG tablet Take 1-2 tablets PRN for anxiety 180 tablet 1  . lamoTRIgine (LAMICTAL) 100 MG tablet Take 1 tablet (100 mg total) by mouth daily. 90 tablet 0  . Multiple Vitamin (MULTI VITAMIN PO) Take 1 tablet by mouth daily.     . trazodone (DESYREL) 300 MG tablet Take 1 tablet (300 mg total) by mouth at bedtime. 30 tablet 0   No current facility-administered medications for this visit.     Allergies as of 05/10/2017 - Review Complete 05/10/2017  Allergen Reaction Noted  . Amlodipine Swelling 12/09/2013  . Cortisporin [neomycin-polymyxin-hc] Itching and Rash 11/12/2011    Family History  Problem Relation Age of Onset  . Hypertension Mother   . Cancer Mother        kidney  . Diabetes Father   . Hypertension Father   . Colon cancer Neg Hx   . Colon polyps Neg Hx     Social History   Social History  . Marital status: Married    Spouse name: N/A  . Number of children: N/A  . Years of education:  N/A   Occupational History  . Not on file.   Social History Main Topics  . Smoking status: Never Smoker  . Smokeless tobacco: Never Used  . Alcohol use 0.0 oz/week     Comment: Drinks wine about 2-3 times a week  . Drug use: No  . Sexual activity: No   Other Topics Concern  . Not on file   Social History Narrative  . No narrative on file    Review of Systems: Gen: see HPI  CV: Denies chest pain, heart palpitations, peripheral edema, syncope.  Resp: Denies shortness of breath at rest or with exertion. Denies wheezing or cough.  GI: see HPI  GU : Denies urinary burning, urinary frequency, urinary hesitancy MS: Denies joint pain, muscle weakness, cramps, or limitation  of movement.  Derm: Denies rash, itching, dry skin Psych: Denies depression, anxiety, memory loss, and confusion Heme: see HPI   Physical Exam: BP 115/85   Pulse 94   Temp 99.3 F (37.4 C) (Oral)   Ht 4' 11.5" (1.511 m)   Wt 104 lb 6.4 oz (47.4 kg)   LMP 08/09/2013   BMI 20.73 kg/m  General:   Alert and oriented. Pleasant and cooperative. Well-nourished and well-developed.  Head:  Normocephalic and atraumatic. Eyes:  Without icterus, sclera clear and conjunctiva pink.  Ears:  Normal auditory acuity. Nose:  No deformity, discharge,  or lesions. Mouth:  No deformity or lesions Lungs:  Clear to auscultation bilaterally. No wheezes, rales, or rhonchi. No distress.  Heart:  S1, S2 present without murmurs appreciated.  Abdomen:  +BS, soft, non-tender and non-distended. No HSM noted. No guarding or rebound. No masses appreciated.  Rectal:  Deferred  Msk:  Symmetrical without gross deformities. Normal posture. Extremities:  Without  edema. Neurologic:  Alert and  oriented x4 Psych:  Alert and cooperative. Normal mood and affect.  Lab Results  Component Value Date   WBC 5.1 05/05/2017   HGB 13.4 05/05/2017   HCT 41.9 05/05/2017   MCV 84 05/05/2017   PLT 227 05/05/2017   Lab Results  Component Value Date   ALT 14 05/05/2017   AST 23 05/05/2017   ALKPHOS 61 05/05/2017   BILITOT 0.4 05/05/2017   Lab Results  Component Value Date   CREATININE 0.59 05/05/2017   BUN 12 05/05/2017   NA 139 05/05/2017   K 4.5 05/05/2017   CL 100 05/05/2017   CO2 20 05/05/2017

## 2017-05-11 ENCOUNTER — Other Ambulatory Visit: Payer: Self-pay

## 2017-05-11 LAB — TSH: TSH: 1.87 m[IU]/L

## 2017-05-11 LAB — VITAMIN D 25 HYDROXY (VIT D DEFICIENCY, FRACTURES): Vit D, 25-Hydroxy: 43 ng/mL (ref 30–100)

## 2017-05-11 MED ORDER — NA SULFATE-K SULFATE-MG SULF 17.5-3.13-1.6 GM/177ML PO SOLN: ORAL | 0 refills | Status: DC

## 2017-05-11 NOTE — Progress Notes (Signed)
Called and informed pt that [facility] sent Suprep rx to pharmacy. New instructions mailed with coupon.

## 2017-05-12 ENCOUNTER — Ambulatory Visit (INDEPENDENT_AMBULATORY_CARE_PROVIDER_SITE_OTHER): Payer: BLUE CROSS/BLUE SHIELD | Admitting: Family Medicine

## 2017-05-12 ENCOUNTER — Encounter: Payer: Self-pay | Admitting: Family Medicine

## 2017-05-12 VITALS — BP 117/89 | HR 84 | Temp 98.1°F | Ht 59.5 in | Wt 105.0 lb

## 2017-05-12 DIAGNOSIS — Z23 Encounter for immunization: Secondary | ICD-10-CM

## 2017-05-12 DIAGNOSIS — R197 Diarrhea, unspecified: Secondary | ICD-10-CM

## 2017-05-12 NOTE — Progress Notes (Signed)
BP 117/89   Pulse 84   Temp 98.1 F (36.7 C) (Oral)   Ht 4' 11.5" (1.511 m)   Wt 105 lb (47.6 kg)   LMP 08/09/2013   BMI 20.85 kg/m    Subjective:    Patient ID: [name], female    DOB: 25-Dec-1960, 56 y.o.   MRN: [id number]  HPI: [name] is a 56 y.o. female presenting on 05/12/2017 for Follow-up diarrhea/bacterial (colonoscopy scheduled 06/06/17)   HPI Follow-up diarrhea/infection Ration is coming in today for a follow-up for diarrhea/bacterial infection. She says that her diarrhea has cleared up. She is still a little low on energy but it is improving and she has not had any diarrhea for at least 2 days. She denies any blood in her stool. She does have a colonoscopy planned for next week with her gastroenterology. She denies any abdominal pain or nausea or vomiting today. She is really doing much better and is ready to go back to work and needs a note for that.  Relevant past medical, surgical, family and social history reviewed and updated as indicated. Interim medical history since our last visit reviewed. Allergies and medications reviewed and updated.  Review of Systems  Constitutional: Negative for chills and fever.  Eyes: Negative for visual disturbance.  Respiratory: Negative for chest tightness and shortness of breath.   Cardiovascular: Negative for chest pain and leg swelling.  Gastrointestinal: Negative for abdominal pain, constipation, diarrhea, nausea and vomiting.  Musculoskeletal: Negative for back pain and gait problem.  Skin: Negative for rash.  Neurological: Negative for light-headedness and headaches.  Psychiatric/Behavioral: Negative for agitation and behavioral problems.  All other systems reviewed and are negative.   Per HPI unless specifically indicated above        Objective:    BP 117/89   Pulse 84   Temp 98.1 F (36.7 C) (Oral)   Ht 4' 11.5" (1.511 m)   Wt 105 lb (47.6 kg)   LMP 08/09/2013   BMI 20.85 kg/m   Wt Readings from  Last 3 Encounters:  05/12/17 105 lb (47.6 kg)  05/10/17 104 lb 6.4 oz (47.4 kg)  05/05/17 105 lb (47.6 kg)    Physical Exam  Constitutional: She is oriented to person, place, and time. She appears well-developed and well-nourished. No distress.  Eyes: Conjunctivae are normal.  Neck: Neck supple. No thyromegaly present.  Cardiovascular: Normal rate, regular rhythm, normal heart sounds and intact distal pulses.   No murmur heard. Pulmonary/Chest: Effort normal and breath sounds normal. No respiratory distress. She has no wheezes. She has no rales.  Abdominal: Soft. Bowel sounds are normal. She exhibits no distension. There is no tenderness. There is no rebound.  Musculoskeletal: Normal range of motion. She exhibits no edema or tenderness.  Lymphadenopathy:    She has no cervical adenopathy.  Neurological: She is alert and oriented to person, place, and time. Coordination normal.  Skin: Skin is warm and dry. No rash noted. She is not diaphoretic.  Psychiatric: She has a normal mood and affect. Her behavior is normal.  Nursing note and vitals reviewed.     Assessment & Plan:   Problem List Items Addressed This Visit    None    Visit Diagnoses    Diarrhea, unspecified type    -  Primary   Resolved today, patient denies any further issues and wants to go back to work       Follow up plan: Return if symptoms worsen or  fail to improve.  Counseling provided for all of the vaccine components No orders of the defined types were placed in this encounter.   Caryl Pina, MD [facility] 05/12/2017, 4:03 PM

## 2017-05-16 NOTE — Progress Notes (Signed)
PT is aware.

## 2017-05-16 NOTE — Progress Notes (Signed)
Normal TSH and Vit D. Keep plans for colonoscopy.

## 2017-05-23 DIAGNOSIS — F3342 Major depressive disorder, recurrent, in full remission: Secondary | ICD-10-CM | POA: Diagnosis not present

## 2017-05-23 DIAGNOSIS — F410 Panic disorder [episodic paroxysmal anxiety] without agoraphobia: Secondary | ICD-10-CM | POA: Diagnosis not present

## 2017-05-30 ENCOUNTER — Telehealth: Payer: Self-pay

## 2017-05-30 NOTE — Telephone Encounter (Signed)
Noted. It looks like she is already slated to return to see me in Jan.

## 2017-05-30 NOTE — Telephone Encounter (Signed)
Pt called office to cancel her colonoscopy that was scheduled for 06/06/17. She states she just doesn't have the money right now. Called and informed Endo Scheduler.  Routing to AB as FYI.

## 2017-05-30 NOTE — Patient Instructions (Signed)
Londen J Killilea  05/30/2017     @PREFPERIOPPHARMACY @   Your procedure is scheduled on  06/06/2017 .  Report to [facility] at  745   A.M.  Call this number if you have problems the morning of surgery:  [phone]   Remember:  Do not eat food or drink liquids after midnight.  Take these medicines the morning of surgery with A SIP OF WATER  Buspar, klonopin, lexapro, nexium.   Do not wear jewelry, make-up or nail polish.  Do not wear lotions, powders, or perfumes, or deoderant.  Do not shave 48 hours prior to surgery.  Men may shave face and neck.  Do not bring valuables to the hospital.  [facility] is not responsible for any belongings or valuables.  Contacts, dentures or bridgework may not be worn into surgery.  Leave your suitcase in the car.  After surgery it may be brought to your room.  For patients admitted to the hospital, discharge time will be determined by your treatment team.  Patients discharged the day of surgery will not be allowed to drive home.   Name and phone number of your driver:   family Special instructions:  Follow the diet and prep instructions given to you by Dr Nona Dell office.  Please read over the following fact sheets that you were given. Anesthesia Post-op Instructions and Care and Recovery After Surgery       Colonoscopy, Adult A colonoscopy is an exam to look at the entire large intestine. During the exam, a lubricated, bendable tube is inserted into the anus and then passed into the rectum, colon, and other parts of the large intestine. A colonoscopy is often done as a part of normal colorectal screening or in response to certain symptoms, such as anemia, persistent diarrhea, abdominal pain, and blood in the stool. The exam can help screen for and diagnose medical problems, including:  Tumors.  Polyps.  Inflammation.  Areas of bleeding.  Tell a health care provider about:  Any allergies you have.  All medicines  you are taking, including vitamins, herbs, eye drops, creams, and over-the-counter medicines.  Any problems you or family members have had with anesthetic medicines.  Any blood disorders you have.  Any surgeries you have had.  Any medical conditions you have.  Any problems you have had passing stool. What are the risks? Generally, this is a safe procedure. However, problems may occur, including:  Bleeding.  A tear in the intestine.  A reaction to medicines given during the exam.  Infection (rare).  What happens before the procedure? Eating and drinking restrictions Follow instructions from your health care provider about eating and drinking, which may include:  A few days before the procedure - follow a low-fiber diet. Avoid nuts, seeds, dried fruit, raw fruits, and vegetables.  1-3 days before the procedure - follow a clear liquid diet. Drink only clear liquids, such as clear broth or bouillon, black coffee or tea, clear juice, clear soft drinks or sports drinks, gelatin dessert, and popsicles. Avoid any liquids that contain red or purple dye.  On the day of the procedure - do not eat or drink anything during the 2 hours before the procedure, or within the time period that your health care provider recommends.  Bowel prep If you were prescribed an oral bowel prep to clean out your colon:  Take it as told by your health care provider. Starting the day  before your procedure, you will need to drink a large amount of medicated liquid. The liquid will cause you to have multiple loose stools until your stool is almost clear or light green.  If your skin or anus gets irritated from diarrhea, you may use these to relieve the irritation: ? Medicated wipes, such as adult wet wipes with aloe and vitamin E. ? A skin soothing-product like petroleum jelly.  If you vomit while drinking the bowel prep, take a break for up to 60 minutes and then begin the bowel prep again. If vomiting  continues and you cannot take the bowel prep without vomiting, call your health care provider.  General instructions  Ask your health care provider about changing or stopping your regular medicines. This is especially important if you are taking diabetes medicines or blood thinners.  Plan to have someone take you home from the hospital or clinic. What happens during the procedure?  An IV tube may be inserted into one of your veins.  You will be given medicine to help you relax (sedative).  To reduce your risk of infection: ? Your health care team will wash or sanitize their hands. ? Your anal area will be washed with soap.  You will be asked to lie on your side with your knees bent.  Your health care provider will lubricate a long, thin, flexible tube. The tube will have a camera and a light on the end.  The tube will be inserted into your anus.  The tube will be gently eased through your rectum and colon.  Air will be delivered into your colon to keep it open. You may feel some pressure or cramping.  The camera will be used to take images during the procedure.  A small tissue sample may be removed from your body to be examined under a microscope (biopsy). If any potential problems are found, the tissue will be sent to a lab for testing.  If small polyps are found, your health care provider may remove them and have them checked for cancer cells.  The tube that was inserted into your anus will be slowly removed. The procedure may vary among health care providers and hospitals. What happens after the procedure?  Your blood pressure, heart rate, breathing rate, and blood oxygen level will be monitored until the medicines you were given have worn off.  Do not drive for 24 hours after the exam.  You may have a small amount of blood in your stool.  You may pass gas and have mild abdominal cramping or bloating due to the air that was used to inflate your colon during the  exam.  It is up to you to get the results of your procedure. Ask your health care provider, or the department performing the procedure, when your results will be ready. This information is not intended to replace advice given to you by your health care provider. Make sure you discuss any questions you have with your health care provider. Document Released: 07/22/2000 Document Revised: 05/25/2016 Document Reviewed: 10/06/2015 Elsevier Interactive Patient Education  2018 [employer].  Colonoscopy, Adult, Care After This sheet gives you information about how to care for yourself after your procedure. Your health care provider may also give you more specific instructions. If you have problems or questions, contact your health care provider. What can I expect after the procedure? After the procedure, it is common to have:  A small amount of blood in your stool for 24 hours after  the procedure.  Some gas.  Mild abdominal cramping or bloating.  Follow these instructions at home: General instructions   For the first 24 hours after the procedure: ? Do not drive or use machinery. ? Do not sign important documents. ? Do not drink alcohol. ? Do your regular daily activities at a slower pace than normal. ? Eat soft, easy-to-digest foods. ? Rest often.  Take over-the-counter or prescription medicines only as told by your health care provider.  It is up to you to get the results of your procedure. Ask your health care provider, or the department performing the procedure, when your results will be ready. Relieving cramping and bloating  Try walking around when you have cramps or feel bloated.  Apply heat to your abdomen as told by your health care provider. Use a heat source that your health care provider recommends, such as a moist heat pack or a heating pad. ? Place a towel between your skin and the heat source. ? Leave the heat on for 20-30 minutes. ? Remove the heat if your skin turns  bright red. This is especially important if you are unable to feel pain, heat, or cold. You may have a greater risk of getting burned. Eating and drinking  Drink enough fluid to keep your urine clear or pale yellow.  Resume your normal diet as instructed by your health care provider. Avoid heavy or fried foods that are hard to digest.  Avoid drinking alcohol for as long as instructed by your health care provider. Contact a health care provider if:  You have blood in your stool 2-3 days after the procedure. Get help right away if:  You have more than a small spotting of blood in your stool.  You pass large blood clots in your stool.  Your abdomen is swollen.  You have nausea or vomiting.  You have a fever.  You have increasing abdominal pain that is not relieved with medicine. This information is not intended to replace advice given to you by your health care provider. Make sure you discuss any questions you have with your health care provider. Document Released: 03/08/2004 Document Revised: 04/18/2016 Document Reviewed: 10/06/2015 Elsevier Interactive Patient Education  2018 [place] Anesthesia is a term that refers to techniques, procedures, and medicines that help a person stay safe and comfortable during a medical procedure. Monitored anesthesia care, or sedation, is one type of anesthesia. Your anesthesia specialist may recommend sedation if you will be having a procedure that does not require you to be unconscious, such as:  Cataract surgery.  A dental procedure.  A biopsy.  A colonoscopy.  During the procedure, you may receive a medicine to help you relax (sedative). There are three levels of sedation:  Mild sedation. At this level, you may feel awake and relaxed. You will be able to follow directions.  Moderate sedation. At this level, you will be sleepy. You may not remember the procedure.  Deep sedation. At this level, you will be  asleep. You will not remember the procedure.  The more medicine you are given, the deeper your level of sedation will be. Depending on how you respond to the procedure, the anesthesia specialist may change your level of sedation or the type of anesthesia to fit your needs. An anesthesia specialist will monitor you closely during the procedure. Let your health care provider know about:  Any allergies you have.  All medicines you are taking, including vitamins, herbs, eye  drops, creams, and over-the-counter medicines.  Any use of steroids (by mouth or as a cream).  Any problems you or family members have had with sedatives and anesthetic medicines.  Any blood disorders you have.  Any surgeries you have had.  Any medical conditions you have, such as sleep apnea.  Whether you are pregnant or may be pregnant.  Any use of cigarettes, alcohol, or street drugs. What are the risks? Generally, this is a safe procedure. However, problems may occur, including:  Getting too much medicine (oversedation).  Nausea.  Allergic reaction to medicines.  Trouble breathing. If this happens, a breathing tube may be used to help with breathing. It will be removed when you are awake and breathing on your own.  Heart trouble.  Lung trouble.  Before the procedure Staying hydrated Follow instructions from your health care provider about hydration, which may include:  Up to 2 hours before the procedure - you may continue to drink clear liquids, such as water, clear fruit juice, black coffee, and plain tea.  Eating and drinking restrictions Follow instructions from your health care provider about eating and drinking, which may include:  8 hours before the procedure - stop eating heavy meals or foods such as meat, fried foods, or fatty foods.  6 hours before the procedure - stop eating light meals or foods, such as toast or cereal.  6 hours before the procedure - stop drinking milk or drinks that  contain milk.  2 hours before the procedure - stop drinking clear liquids.  Medicines Ask your health care provider about:  Changing or stopping your regular medicines. This is especially important if you are taking diabetes medicines or blood thinners.  Taking medicines such as aspirin and ibuprofen. These medicines can thin your blood. Do not take these medicines before your procedure if your health care provider instructs you not to.  Tests and exams  You will have a physical exam.  You may have blood tests done to show: ? How well your kidneys and liver are working. ? How well your blood can clot.  General instructions  Plan to have someone take you home from the hospital or clinic.  If you will be going home right after the procedure, plan to have someone with you for 24 hours.  What happens during the procedure?  Your blood pressure, heart rate, breathing, level of pain and overall condition will be monitored.  An IV tube will be inserted into one of your veins.  Your anesthesia specialist will give you medicines as needed to keep you comfortable during the procedure. This may mean changing the level of sedation.  The procedure will be performed. After the procedure  Your blood pressure, heart rate, breathing rate, and blood oxygen level will be monitored until the medicines you were given have worn off.  Do not drive for 24 hours if you received a sedative.  You may: ? Feel sleepy, clumsy, or nauseous. ? Feel forgetful about what happened after the procedure. ? Have a sore throat if you had a breathing tube during the procedure. ? Vomit. This information is not intended to replace advice given to you by your health care provider. Make sure you discuss any questions you have with your health care provider. Document Released: 04/20/2005 Document Revised: 01/01/2016 Document Reviewed: 11/15/2015 Elsevier Interactive Patient Education  2018 [place], Care After These instructions provide you with information about caring for yourself after your procedure. Your health care  provider may also give you more specific instructions. Your treatment has been planned according to current medical practices, but problems sometimes occur. Call your health care provider if you have any problems or questions after your procedure. What can I expect after the procedure? After your procedure, it is common to:  Feel sleepy for several hours.  Feel clumsy and have poor balance for several hours.  Feel forgetful about what happened after the procedure.  Have poor judgment for several hours.  Feel nauseous or vomit.  Have a sore throat if you had a breathing tube during the procedure.  Follow these instructions at home: For at least 24 hours after the procedure:   Do not: ? Participate in activities in which you could fall or become injured. ? Drive. ? Use heavy machinery. ? Drink alcohol. ? Take sleeping pills or medicines that cause drowsiness. ? Make important decisions or sign legal documents. ? Take care of children on your own.  Rest. Eating and drinking  Follow the diet that is recommended by your health care provider.  If you vomit, drink water, juice, or soup when you can drink without vomiting.  Make sure you have little or no nausea before eating solid foods. General instructions  Have a responsible adult stay with you until you are awake and alert.  Take over-the-counter and prescription medicines only as told by your health care provider.  If you smoke, do not smoke without supervision.  Keep all follow-up visits as told by your health care provider. This is important. Contact a health care provider if:  You keep feeling nauseous or you keep vomiting.  You feel light-headed.  You develop a rash.  You have a fever. Get help right away if:  You have trouble breathing. This information is not  intended to replace advice given to you by your health care provider. Make sure you discuss any questions you have with your health care provider. Document Released: 11/15/2015 Document Revised: 03/16/2016 Document Reviewed: 11/15/2015 Elsevier Interactive Patient Education  [employer].

## 2017-06-01 ENCOUNTER — Inpatient Hospital Stay (HOSPITAL_COMMUNITY): Admission: RE | Admit: 2017-06-01 | Payer: Self-pay | Source: Ambulatory Visit

## 2017-06-06 ENCOUNTER — Encounter (HOSPITAL_COMMUNITY): Admission: RE | Payer: Self-pay | Source: Ambulatory Visit

## 2017-06-06 ENCOUNTER — Ambulatory Visit (HOSPITAL_COMMUNITY)
Admission: RE | Admit: 2017-06-06 | Payer: BLUE CROSS/BLUE SHIELD | Source: Ambulatory Visit | Admitting: Gastroenterology

## 2017-06-06 SURGERY — COLONOSCOPY WITH PROPOFOL
Anesthesia: Monitor Anesthesia Care

## 2017-07-14 DIAGNOSIS — N3090 Cystitis, unspecified without hematuria: Secondary | ICD-10-CM | POA: Diagnosis not present

## 2017-07-27 DIAGNOSIS — R202 Paresthesia of skin: Secondary | ICD-10-CM | POA: Diagnosis not present

## 2017-07-27 DIAGNOSIS — R201 Hypoesthesia of skin: Secondary | ICD-10-CM | POA: Diagnosis not present

## 2017-07-27 DIAGNOSIS — G250 Essential tremor: Secondary | ICD-10-CM | POA: Diagnosis not present

## 2017-07-27 DIAGNOSIS — M5417 Radiculopathy, lumbosacral region: Secondary | ICD-10-CM | POA: Diagnosis not present

## 2017-07-27 DIAGNOSIS — M5412 Radiculopathy, cervical region: Secondary | ICD-10-CM | POA: Diagnosis not present

## 2017-07-27 DIAGNOSIS — G5603 Carpal tunnel syndrome, bilateral upper limbs: Secondary | ICD-10-CM | POA: Diagnosis not present

## 2017-07-27 DIAGNOSIS — Z79899 Other long term (current) drug therapy: Secondary | ICD-10-CM | POA: Diagnosis not present

## 2017-08-10 ENCOUNTER — Ambulatory Visit: Payer: BLUE CROSS/BLUE SHIELD | Admitting: Gastroenterology

## 2017-08-25 DIAGNOSIS — Z0000 Encounter for general adult medical examination without abnormal findings: Secondary | ICD-10-CM | POA: Diagnosis not present

## 2017-08-25 DIAGNOSIS — K219 Gastro-esophageal reflux disease without esophagitis: Secondary | ICD-10-CM | POA: Diagnosis not present

## 2017-09-06 ENCOUNTER — Telehealth: Payer: Self-pay

## 2017-09-06 MED ORDER — OMEPRAZOLE 20 MG PO CPDR: 20.0000 mg | DELAYED_RELEASE_CAPSULE | Freq: Every day | ORAL | 2 refills | Status: DC

## 2017-09-06 NOTE — Telephone Encounter (Signed)
Per patient, her insurance has increased the price of her Nexium to almost $200 per month and she needs [place] to change to something else.  Per Dr. Warrick Parisian, send in Omeprazole 20 mg, once per day

## 2017-09-08 ENCOUNTER — Other Ambulatory Visit: Payer: Self-pay | Admitting: Family

## 2017-09-18 ENCOUNTER — Ambulatory Visit (INDEPENDENT_AMBULATORY_CARE_PROVIDER_SITE_OTHER): Payer: BLUE CROSS/BLUE SHIELD | Admitting: Physician Assistant

## 2017-09-18 VITALS — BP 152/90 | HR 117 | Temp 97.8°F

## 2017-09-18 DIAGNOSIS — R000 Tachycardia, unspecified: Secondary | ICD-10-CM | POA: Diagnosis not present

## 2017-09-18 DIAGNOSIS — Z0289 Encounter for other administrative examinations: Secondary | ICD-10-CM

## 2017-09-18 MED ORDER — CARVEDILOL 6.25 MG PO TABS: 6.2500 mg | ORAL_TABLET | Freq: Two times a day (BID) | ORAL | 3 refills | Status: DC

## 2017-09-18 NOTE — Patient Instructions (Signed)
Sinus Tachycardia °Sinus tachycardia is a kind of fast heartbeat. In sinus tachycardia, the heart beats more than 100 times a minute. Sinus tachycardia starts in a part of the heart called the sinus node. Sinus tachycardia may be harmless, or it may be a sign of a serious condition. °What are the causes? °This condition may be caused by: °· Exercise or exertion. °· A fever. °· Pain. °· Loss of body fluids (dehydration). °· Severe bleeding (hemorrhage). °· Anxiety and stress. °· Certain substances, including: °? Alcohol. °? Caffeine. °? Tobacco and nicotine products. °? Diet pills. °? Illegal drugs. °· Medical conditions including: °? Heart disease. °? An infection. °? An overactive thyroid (hyperthyroidism). °? A lack of red blood cells (anemia). ° °What are the signs or symptoms? °Symptoms of this condition include: °· A feeling that the heart is beating quickly (palpitations). °· Suddenly noticing your heartbeat (cardiac awareness). °· Dizziness. °· Tiredness (fatigue). °· Shortness of breath. °· Chest pain. °· Nausea. °· Fainting. ° °How is this diagnosed? °This condition is diagnosed with: °· A physical exam. °· Other tests, such as: °? Blood tests. °? An electrocardiogram (ECG). This test measures the electrical activity of the heart. °? Holter monitoring. For this test, you wear a device that records your heartbeat for one or more days. ° °You may be referred to a heart specialist (cardiologist). °How is this treated? °Treatment for this condition depends on the cause or underlying condition. Treatment may involve: °· Treating the underlying condition. °· Taking new medicines or changing your current medicines as told by your health care provider. °· Making changes to your diet or lifestyle. °· Practicing relaxation methods. ° °Follow these instructions at home: °Lifestyle °· Do not use any products that contain nicotine or tobacco, such as cigarettes and e-cigarettes. If you need help quitting, ask your  health care provider. °· Learn relaxation methods, like deep breathing, to help you when you get stressed or anxious. °· Do not use illegal drugs, such as cocaine. °· Do not abuse alcohol. Limit alcohol intake to no more than 1 drink a day for non-pregnant women and 2 drinks a day for men. One drink equals 12 oz of beer, 5 oz of wine, or 1½ oz of hard liquor. °· Find time to rest and relax often. This reduces stress. °· Avoid: °? Caffeine. °? Stimulants such as over-the-counter diet pills or pills that help you to stay awake. °? Situations that cause anxiety or stress. °General instructions °· Drink enough fluids to keep your urine clear or pale yellow. °· Take over-the-counter and prescription medicines only as told by your health care provider. °· Keep all follow-up visits as told by your health care provider. This is important. °Contact a health care provider if: °· You have a fever. °· You have vomiting or diarrhea that keeps happening (is persistent). °Get help right away if: °· You have pain in your chest, upper arms, jaw, or neck. °· You become weak or dizzy. °· You feel faint. °· You have palpitations that do not go away. °This information is not intended to replace advice given to you by your health care provider. Make sure you discuss any questions you have with your health care provider. °Document Released: 09/01/2004 Document Revised: 02/20/2016 Document Reviewed: 02/06/2015 °Elsevier Interactive Patient Education © 2018 [employer] ° °

## 2017-09-18 NOTE — Progress Notes (Signed)
BP (!) 152/90 (BP Location: Left Arm, Patient Position: Sitting, Cuff Size: Normal)   Pulse (!) 117   Temp 97.8 F (36.6 C) (Oral)   LMP 08/09/2013   SpO2 99%    Subjective:    Patient ID: [name], female    DOB: Nov 16, 1960, 57 y.o.   MRN: [id number]  HPI: [name] is a 57 y.o. female presenting on 09/18/2017 for Tachycardia (chest pressure, palpitations)  Patient has had one day of tachycardia, she felt like her heart was beating out of her chest.  She denies pain, diaphoresis, nausea or vomiting. Drinks limited caffeine. Sleeps well. Denies fever or chills.   No cardiac history. No family history of arrhythmias.  Past Medical History:  Diagnosis Date  . Alcohol dependence with alcohol-induced mood disorder ([place]) 07/2016  . Chest pain   . Depression   . GERD (gastroesophageal reflux disease)   . HTN (hypertension)    resolved after sleeve  . Seizures ([place])    patient denies   Relevant past medical, surgical, family and social history reviewed and updated as indicated. Interim medical history since our last visit reviewed. Allergies and medications reviewed and updated. DATA REVIEWED: CHART IN EPIC  Family History reviewed for pertinent findings.  Review of Systems  Constitutional: Negative.  Negative for activity change, chills, diaphoresis, fatigue and fever.  HENT: Negative.   Eyes: Negative.   Respiratory: Negative.  Negative for cough, shortness of breath and wheezing.   Cardiovascular: Positive for palpitations. Negative for chest pain and leg swelling.  Gastrointestinal: Negative.  Negative for abdominal pain.  Endocrine: Negative.   Genitourinary: Negative.  Negative for dysuria.  Musculoskeletal: Negative.   Skin: Negative.   Neurological: Negative.     Allergies as of 09/18/2017      Reactions   Amlodipine Swelling   Ankle swelling   Cortisporin [neomycin-polymyxin-hc] Itching, Rash      Medication List        Accurate as of 09/18/17  4:03  PM. Always use your most recent med list.          b complex vitamins capsule Take 1 capsule by mouth daily.   busPIRone 10 MG tablet Commonly known as:  BUSPAR Take 1-2 tablets three times daily for anxiety   carvedilol 6.25 MG tablet Commonly known as:  COREG Take 1 tablet (6.25 mg total) by mouth 2 (two) times daily with a meal.   clonazePAM 1 MG tablet Commonly known as:  KLONOPIN Take 1 mg by mouth 3 (three) times daily as needed for anxiety.   escitalopram 20 MG tablet Commonly known as:  LEXAPRO Take 30 mg by mouth daily.   esomeprazole 20 MG capsule Commonly known as:  NEXIUM Take 1 capsule (20 mg total) by mouth daily.   hydrOXYzine 25 MG tablet Commonly known as:  ATARAX/VISTARIL Take 1-2 tablets PRN for anxiety   lamoTRIgine 100 MG tablet Commonly known as:  LAMICTAL Take 1 tablet (100 mg total) by mouth daily.   MULTI VITAMIN PO Take 1 tablet by mouth daily.   omeprazole 20 MG capsule Commonly known as:  PRILOSEC Take 1 capsule (20 mg total) by mouth daily.   trazodone 300 MG tablet Commonly known as:  DESYREL Take 1 tablet (300 mg total) by mouth at bedtime.          Objective:    BP (!) 152/90 (BP Location: Left Arm, Patient Position: Sitting, Cuff Size: Normal)   Pulse (!) 117  Temp 97.8 F (36.6 C) (Oral)   LMP 08/09/2013   SpO2 99%   Allergies  Allergen Reactions  . Amlodipine Swelling    Ankle swelling   . Cortisporin [Neomycin-Polymyxin-Hc] Itching and Rash    Wt Readings from Last 3 Encounters:  05/12/17 105 lb (47.6 kg)  05/10/17 104 lb 6.4 oz (47.4 kg)  05/05/17 105 lb (47.6 kg)    Physical Exam  Constitutional: She is oriented to person, place, and time. She appears well-developed and well-nourished.  HENT:  Head: Normocephalic and atraumatic.  Eyes: Conjunctivae and EOM are normal. Pupils are equal, round, and reactive to light.  Cardiovascular: Regular rhythm, normal heart sounds and intact distal pulses.  No  extrasystoles are present. Tachycardia present.  EKG with sinus tachycardia  Pulmonary/Chest: Effort normal and breath sounds normal.  Abdominal: Soft. Bowel sounds are normal.  Neurological: She is alert and oriented to person, place, and time. She has normal reflexes.  Skin: Skin is warm and dry. No rash noted.  Psychiatric: She has a normal mood and affect. Her behavior is normal. Judgment and thought content normal.  Nursing note and vitals reviewed.   Results for orders placed or performed in visit on 05/10/17  TSH  Result Value Ref Range   TSH 1.87 mIU/L  Vitamin D (25 hydroxy)  Result Value Ref Range   Vit D, 25-Hydroxy 43 30 - 100 ng/mL      Assessment & Plan:   1. Tachycardia - EKG 12-Lead - carvedilol (COREG) 6.25 MG tablet; Take 1 tablet (6.25 mg total) by mouth 2 (two) times daily with a meal.  Dispense: 60 tablet; Refill: 3   Continue all other maintenance medications as listed above.  Follow up plan: Return in about 4 days (around 09/22/2017) for recheck.  Educational handout given for [place] PA-C [facility] [address]  [place], [place] [address] [phone]   09/18/2017, 4:03 PM

## 2017-09-21 ENCOUNTER — Ambulatory Visit: Payer: BLUE CROSS/BLUE SHIELD | Admitting: Family

## 2017-09-22 ENCOUNTER — Ambulatory Visit: Payer: BLUE CROSS/BLUE SHIELD | Admitting: Family Medicine

## 2017-09-22 ENCOUNTER — Telehealth: Payer: Self-pay | Admitting: Family

## 2017-09-22 NOTE — Telephone Encounter (Signed)
Patient aware that letter is ready for pick up.

## 2017-09-22 NOTE — Telephone Encounter (Signed)
Note ready for pick up

## 2017-09-22 NOTE — Telephone Encounter (Signed)
Please advise 

## 2017-10-13 ENCOUNTER — Encounter: Payer: Self-pay | Admitting: Family Medicine

## 2017-10-13 ENCOUNTER — Ambulatory Visit: Payer: BLUE CROSS/BLUE SHIELD | Admitting: Family Medicine

## 2017-10-13 ENCOUNTER — Ambulatory Visit (INDEPENDENT_AMBULATORY_CARE_PROVIDER_SITE_OTHER): Payer: BLUE CROSS/BLUE SHIELD | Admitting: Family Medicine

## 2017-10-13 VITALS — BP 126/82 | HR 91 | Temp 98.9°F | Ht 59.5 in | Wt 99.4 lb

## 2017-10-13 DIAGNOSIS — R350 Frequency of micturition: Secondary | ICD-10-CM | POA: Diagnosis not present

## 2017-10-13 LAB — MICROSCOPIC EXAMINATION
Bacteria, UA: NONE SEEN
EPITHELIAL CELLS (NON RENAL): NONE SEEN /HPF (ref 0–10)
RBC, UA: NONE SEEN /hpf (ref 0–?)
Renal Epithel, UA: NONE SEEN /hpf

## 2017-10-13 LAB — URINALYSIS, COMPLETE
Bilirubin, UA: NEGATIVE
Glucose, UA: NEGATIVE
KETONES UA: NEGATIVE
LEUKOCYTES UA: NEGATIVE
Nitrite, UA: NEGATIVE
Protein, UA: NEGATIVE
RBC, UA: NEGATIVE
SPEC GRAV UA: 1.01 (ref 1.005–1.030)
Urobilinogen, Ur: 0.2 mg/dL (ref 0.2–1.0)
pH, UA: 8.5 — ABNORMAL HIGH (ref 5.0–7.5)

## 2017-10-13 MED ORDER — PHENAZOPYRIDINE HCL 95 MG PO TABS: 95.0000 mg | ORAL_TABLET | Freq: Three times a day (TID) | ORAL | 0 refills | Status: DC | PRN

## 2017-10-13 MED ORDER — CEPHALEXIN 500 MG PO CAPS: 500.0000 mg | ORAL_CAPSULE | Freq: Four times a day (QID) | ORAL | 0 refills | Status: DC

## 2017-10-13 NOTE — Addendum Note (Signed)
Addended by: Caryl Pina on: 10/13/2017 11:33 AM   Modules accepted: Orders

## 2017-10-13 NOTE — Progress Notes (Signed)
BP 126/82   Pulse 91   Temp 98.9 F (37.2 C) (Oral)   Ht 4' 11.5" (1.511 m)   Wt 99 lb 6 oz (45.1 kg)   LMP 08/09/2013   BMI 19.74 kg/m    Subjective:    Patient ID: [name], female    DOB: 1961/02/24, 58 y.o.   MRN: [id number]  HPI: [name] is a 57 y.o. female presenting on 10/13/2017 for Discuss FMLA paperwork and Urinary Tract Infection (urinary frequency, feels like she is unable to completely empty bladder)   HPI Dysuria and frequency Patient comes in with complaints of 2 days worth of dysuria and urinary frequency.  She denies any fevers or chills.  She denies any abdominal pain.  She denies any flank pain or pain radiating anywhere else.  She says is mild currently but was coming in anyway's wanted to get it checked out.  Patient is coming in to discuss FMLA for her back but has seen her spinal person for this and we cannot see their notes at [facility] neurology so we cannot fill out the [facility] based off her diagnosis because we do not have them.  Relevant past medical, surgical, family and social history reviewed and updated as indicated. Interim medical history since our last visit reviewed. Allergies and medications reviewed and updated.  Review of Systems  Constitutional: Negative for chills and fever.  Eyes: Negative for visual disturbance.  Respiratory: Negative for chest tightness and shortness of breath.   Cardiovascular: Negative for chest pain and leg swelling.  Genitourinary: Positive for dysuria. Negative for difficulty urinating.  Musculoskeletal: Negative for back pain and gait problem.  Skin: Negative for rash.  Neurological: Negative for light-headedness and headaches.  Psychiatric/Behavioral: Negative for agitation and behavioral problems.  All other systems reviewed and are negative.   Per HPI unless specifically indicated above       Objective:    BP 126/82   Pulse 91   Temp 98.9 F (37.2 C) (Oral)   Ht 4' 11.5" (1.511 m)   Wt 99 lb 6  oz (45.1 kg)   LMP 08/09/2013   BMI 19.74 kg/m   Wt Readings from Last 3 Encounters:  10/13/17 99 lb 6 oz (45.1 kg)  05/12/17 105 lb (47.6 kg)  05/10/17 104 lb 6.4 oz (47.4 kg)    Physical Exam  Constitutional: She is oriented to person, place, and time. She appears well-developed and well-nourished. No distress.  Eyes: Conjunctivae are normal.  Neck: Neck supple. No thyromegaly present.  Cardiovascular: Normal rate, regular rhythm, normal heart sounds and intact distal pulses.  No murmur heard. Pulmonary/Chest: Effort normal and breath sounds normal. No respiratory distress. She has no wheezes. She has no rales.  Lymphadenopathy:    She has no cervical adenopathy.  Neurological: She is alert and oriented to person, place, and time. Coordination normal.  Skin: Skin is warm and dry. No rash noted. She is not diaphoretic.  Psychiatric: She has a normal mood and affect. Her behavior is normal.  Nursing note and vitals reviewed.   Urinalysis 0-5 WBCs, otherwise normal    Assessment & Plan:   Problem List Items Addressed This Visit    None    Visit Diagnoses    Urinary frequency    -  Primary   Relevant Medications   cephALEXin (KEFLEX) 500 MG capsule   Other Relevant Orders   Urinalysis, Complete      Will treat symptomatically. Because we cannot see  her specialist notes from [facility] neurology we cannot do the FMLA for her. Follow up plan: Return if symptoms worsen or fail to improve.  Counseling provided for all of the vaccine components Orders Placed This Encounter  Procedures  . Urinalysis, Complete    Caryl Pina, MD [facility] 10/13/2017, 10:01 AM

## 2017-10-23 ENCOUNTER — Telehealth: Payer: Self-pay | Admitting: Family

## 2017-10-23 NOTE — Telephone Encounter (Signed)
Pt traded car with Handicap licence's plate can we write her another form. Please advise.

## 2017-10-23 NOTE — Telephone Encounter (Signed)
Advised [place] she could have the handicap paperwork filled out here and she could take to [facility] to get new tag.

## 2017-10-27 ENCOUNTER — Encounter (HOSPITAL_COMMUNITY): Payer: Self-pay

## 2017-10-27 ENCOUNTER — Ambulatory Visit: Payer: BLUE CROSS/BLUE SHIELD | Admitting: Family

## 2017-10-27 ENCOUNTER — Emergency Department (HOSPITAL_COMMUNITY)
Admission: EM | Admit: 2017-10-27 | Discharge: 2017-10-28 | Disposition: A | Discharge status: Other Acute Inpatient Hospital | Payer: BLUE CROSS/BLUE SHIELD | Attending: Emergency Medicine | Admitting: Emergency Medicine

## 2017-10-27 DIAGNOSIS — Z733 Stress, not elsewhere classified: Secondary | ICD-10-CM | POA: Insufficient documentation

## 2017-10-27 DIAGNOSIS — F1020 Alcohol dependence, uncomplicated: Secondary | ICD-10-CM | POA: Insufficient documentation

## 2017-10-27 DIAGNOSIS — I10 Essential (primary) hypertension: Secondary | ICD-10-CM | POA: Insufficient documentation

## 2017-10-27 DIAGNOSIS — Z79899 Other long term (current) drug therapy: Secondary | ICD-10-CM | POA: Insufficient documentation

## 2017-10-27 DIAGNOSIS — F332 Major depressive disorder, recurrent severe without psychotic features: Secondary | ICD-10-CM

## 2017-10-27 DIAGNOSIS — R45851 Suicidal ideations: Secondary | ICD-10-CM | POA: Diagnosis not present

## 2017-10-27 DIAGNOSIS — Z915 Personal history of self-harm: Secondary | ICD-10-CM | POA: Insufficient documentation
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
## 2017-10-27 NOTE — ED Notes (Signed)
Pt c/o SI without a plan. 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 She reports around 3 drinks/day if she is not working. Visible tremors in the arms. Last drink around 16:00.

## 2017-10-27 NOTE — ED Notes (Signed)
Pt requesting detox from [facility] with her husband, she states that her last drink was about 4pm, she also states that she is suicidal and says she can't take it anymore, she is her husbands caregiver

## 2017-10-28 ENCOUNTER — Inpatient Hospital Stay (HOSPITAL_COMMUNITY)
Admission: AD | Admit: 2017-10-28 | Discharge: 2017-11-01 | DRG: 885 | Disposition: A | Discharge status: Home / Self Care | Payer: BLUE CROSS/BLUE SHIELD | Source: Ambulatory Visit | Attending: Psychiatry | Admitting: Psychiatry

## 2017-10-28 ENCOUNTER — Encounter (HOSPITAL_COMMUNITY): Payer: Self-pay

## 2017-10-28 ENCOUNTER — Other Ambulatory Visit: Payer: Self-pay

## 2017-10-28 DIAGNOSIS — R450 Nervousness: Secondary | ICD-10-CM | POA: Diagnosis not present

## 2017-10-28 DIAGNOSIS — Y900 Blood alcohol level of less than 20 mg/100 ml: Secondary | ICD-10-CM | POA: Diagnosis present

## 2017-10-28 DIAGNOSIS — I10 Essential (primary) hypertension: Secondary | ICD-10-CM | POA: Diagnosis not present

## 2017-10-28 DIAGNOSIS — Z881 Allergy status to other antibiotic agents status: Secondary | ICD-10-CM

## 2017-10-28 DIAGNOSIS — Z8659 Personal history of other mental and behavioral disorders: Secondary | ICD-10-CM

## 2017-10-28 DIAGNOSIS — Z634 Disappearance and death of family member: Secondary | ICD-10-CM

## 2017-10-28 DIAGNOSIS — F411 Generalized anxiety disorder: Secondary | ICD-10-CM | POA: Diagnosis present

## 2017-10-28 DIAGNOSIS — F1020 Alcohol dependence, uncomplicated: Secondary | ICD-10-CM | POA: Diagnosis present

## 2017-10-28 DIAGNOSIS — M549 Dorsalgia, unspecified: Secondary | ICD-10-CM | POA: Diagnosis not present

## 2017-10-28 DIAGNOSIS — Z62811 Personal history of psychological abuse in childhood: Secondary | ICD-10-CM

## 2017-10-28 DIAGNOSIS — G4700 Insomnia, unspecified: Secondary | ICD-10-CM | POA: Diagnosis present

## 2017-10-28 DIAGNOSIS — Z62810 Personal history of physical and sexual abuse in childhood: Secondary | ICD-10-CM | POA: Diagnosis not present

## 2017-10-28 DIAGNOSIS — R000 Tachycardia, unspecified: Secondary | ICD-10-CM

## 2017-10-28 DIAGNOSIS — Z811 Family history of alcohol abuse and dependence: Secondary | ICD-10-CM | POA: Diagnosis not present

## 2017-10-28 DIAGNOSIS — W540XXA Bitten by dog, initial encounter: Secondary | ICD-10-CM

## 2017-10-28 DIAGNOSIS — F332 Major depressive disorder, recurrent severe without psychotic features: Secondary | ICD-10-CM | POA: Diagnosis not present

## 2017-10-28 DIAGNOSIS — Z79899 Other long term (current) drug therapy: Secondary | ICD-10-CM

## 2017-10-28 DIAGNOSIS — Z818 Family history of other mental and behavioral disorders: Secondary | ICD-10-CM | POA: Diagnosis not present

## 2017-10-28 DIAGNOSIS — Z888 Allergy status to other drugs, medicaments and biological substances status: Secondary | ICD-10-CM

## 2017-10-28 DIAGNOSIS — F1099 Alcohol use, unspecified with unspecified alcohol-induced disorder: Secondary | ICD-10-CM | POA: Diagnosis not present

## 2017-10-28 DIAGNOSIS — S61452A Open bite of left hand, initial encounter: Secondary | ICD-10-CM | POA: Diagnosis not present

## 2017-10-28 DIAGNOSIS — Z8679 Personal history of other diseases of the circulatory system: Secondary | ICD-10-CM | POA: Diagnosis not present

## 2017-10-28 DIAGNOSIS — Z9884 Bariatric surgery status: Secondary | ICD-10-CM | POA: Diagnosis not present

## 2017-10-28 DIAGNOSIS — F419 Anxiety disorder, unspecified: Secondary | ICD-10-CM | POA: Diagnosis not present

## 2017-10-28 LAB — COMPREHENSIVE METABOLIC PANEL
ALK PHOS: 49 U/L (ref 38–126)
ALT: 17 U/L (ref 14–54)
ANION GAP: 12 (ref 5–15)
AST: 32 U/L (ref 15–41)
Albumin: 4.4 g/dL (ref 3.5–5.0)
BUN: 13 mg/dL (ref 6–20)
CALCIUM: 9.2 mg/dL (ref 8.9–10.3)
CHLORIDE: 104 mmol/L (ref 101–111)
CO2: 25 mmol/L (ref 22–32)
Creatinine, Ser: 0.51 mg/dL (ref 0.44–1.00)
GFR calc non Af Amer: >60 mL/min (ref >60)
Glucose, Bld: 73 mg/dL (ref 65–99)
Potassium: 3.7 mmol/L (ref 3.5–5.1)
SODIUM: 141 mmol/L (ref 135–145)
Total Bilirubin: 0.8 mg/dL (ref 0.3–1.2)
Total Protein: 7.5 g/dL (ref 6.5–8.1)

## 2017-10-28 LAB — CBC
HCT: 41.8 % (ref 36.0–46.0)
HEMOGLOBIN: 13.3 g/dL (ref 12.0–15.0)
MCH: 29.2 pg (ref 26.0–34.0)
MCHC: 31.8 g/dL (ref 30.0–36.0)
MCV: 91.7 fL (ref 78.0–100.0)
PLATELETS: 275 10*3/uL (ref 150–400)
RBC: 4.56 MIL/uL (ref 3.87–5.11)
RDW: 14.4 % (ref 11.5–15.5)
WBC: 6 10*3/uL (ref 4.0–10.5)

## 2017-10-28 LAB — RAPID URINE DRUG SCREEN, HOSP PERFORMED
Amphetamines: NOT DETECTED
Barbiturates: NOT DETECTED
Benzodiazepines: NOT DETECTED
COCAINE: NOT DETECTED
OPIATES: POSITIVE — AB
Tetrahydrocannabinol: NOT DETECTED

## 2017-10-28 LAB — ACETAMINOPHEN LEVEL

## 2017-10-28 LAB — SALICYLATE LEVEL

## 2017-10-28 LAB — ETHANOL

## 2017-10-28 MED ORDER — ENSURE ENLIVE PO LIQD
237.0000 mL | Freq: Two times a day (BID) | ORAL | Status: DC
  Administered 2017-10-28 – 2017-10-31 (×6): 237 mL via ORAL

## 2017-10-28 MED ORDER — VITAMIN B-1 100 MG PO TABS
100.0000 mg | ORAL_TABLET | Freq: Every day | ORAL | Status: DC
  Administered 2017-10-29 – 2017-10-31 (×3): 100 mg via ORAL
  Filled 2017-10-28 (×5): qty 1

## 2017-10-28 MED ORDER — ESCITALOPRAM OXALATE 20 MG PO TABS
20.0000 mg | ORAL_TABLET | Freq: Every day | ORAL | Status: DC
  Administered 2017-10-28 – 2017-11-01 (×5): 20 mg via ORAL
  Filled 2017-10-28 (×7): qty 1
  Filled 2017-10-28: qty 2

## 2017-10-28 MED ORDER — ADULT MULTIVITAMIN W/MINERALS CH
1.0000 | ORAL_TABLET | Freq: Every day | ORAL | Status: DC
  Administered 2017-10-28 – 2017-10-31 (×4): 1 via ORAL
  Filled 2017-10-28 (×6): qty 1

## 2017-10-28 MED ORDER — LORAZEPAM 1 MG PO TABS
1.0000 mg | ORAL_TABLET | Freq: Four times a day (QID) | ORAL | Status: AC | PRN
Start: 2017-10-28 — End: 2017-10-31

## 2017-10-28 MED ORDER — ONDANSETRON 4 MG PO TBDP: 4.0000 mg | ORAL_TABLET | Freq: Four times a day (QID) | ORAL | Status: AC | PRN

## 2017-10-28 MED ORDER — LORAZEPAM 1 MG PO TABS
1.0000 mg | ORAL_TABLET | Freq: Every day | ORAL | Status: AC
  Administered 2017-10-31: 1 mg via ORAL
  Filled 2017-10-28: qty 1

## 2017-10-28 MED ORDER — HYDROXYZINE HCL 25 MG PO TABS
25.0000 mg | ORAL_TABLET | Freq: Four times a day (QID) | ORAL | Status: AC | PRN
  Administered 2017-10-29 – 2017-10-30 (×3): 25 mg via ORAL
  Filled 2017-10-28 (×3): qty 1

## 2017-10-28 MED ORDER — LORAZEPAM 1 MG PO TABS
1.0000 mg | ORAL_TABLET | Freq: Two times a day (BID) | ORAL | Status: AC
  Administered 2017-10-30 (×2): 1 mg via ORAL
  Filled 2017-10-28 (×2): qty 1

## 2017-10-28 MED ORDER — ACETAMINOPHEN 325 MG PO TABS
650.0000 mg | ORAL_TABLET | Freq: Four times a day (QID) | ORAL | Status: DC | PRN
  Administered 2017-10-28 – 2017-10-30 (×3): 650 mg via ORAL
  Filled 2017-10-28 (×3): qty 2

## 2017-10-28 MED ORDER — LORAZEPAM 1 MG PO TABS
1.0000 mg | ORAL_TABLET | Freq: Four times a day (QID) | ORAL | Status: AC
  Administered 2017-10-28 (×4): 1 mg via ORAL
  Filled 2017-10-28 (×4): qty 1

## 2017-10-28 MED ORDER — PANTOPRAZOLE SODIUM 40 MG PO TBEC: 40.0000 mg | DELAYED_RELEASE_TABLET | Freq: Every day | ORAL | Status: DC

## 2017-10-28 MED ORDER — ALUM & MAG HYDROXIDE-SIMETH 200-200-20 MG/5ML PO SUSP
30.0000 mL | ORAL | Status: DC | PRN
  Administered 2017-10-29 – 2017-11-01 (×3): 30 mL via ORAL
  Filled 2017-10-28 (×3): qty 30

## 2017-10-28 MED ORDER — LOPERAMIDE HCL 2 MG PO CAPS
2.0000 mg | ORAL_CAPSULE | ORAL | Status: AC | PRN
  Administered 2017-10-29: 4 mg via ORAL
  Filled 2017-10-28: qty 2

## 2017-10-28 MED ORDER — LAMOTRIGINE 25 MG PO TABS
50.0000 mg | ORAL_TABLET | Freq: Every day | ORAL | Status: DC
  Administered 2017-10-28 – 2017-11-01 (×5): 50 mg via ORAL
  Filled 2017-10-28 (×8): qty 2

## 2017-10-28 MED ORDER — LORAZEPAM 1 MG PO TABS
1.0000 mg | ORAL_TABLET | Freq: Three times a day (TID) | ORAL | Status: AC
  Administered 2017-10-29 (×3): 1 mg via ORAL
  Filled 2017-10-28 (×3): qty 1

## 2017-10-28 MED ORDER — TRAZODONE HCL 50 MG PO TABS
50.0000 mg | ORAL_TABLET | Freq: Every evening | ORAL | Status: DC | PRN
  Administered 2017-10-30 – 2017-10-31 (×2): 50 mg via ORAL
  Filled 2017-10-28 (×2): qty 1

## 2017-10-28 MED ORDER — ENSURE ENLIVE PO LIQD: 237.0000 mL | Freq: Two times a day (BID) | ORAL | Status: DC | PRN

## 2017-10-28 MED ORDER — MAGNESIUM HYDROXIDE 400 MG/5ML PO SUSP: 30.0000 mL | Freq: Every day | ORAL | Status: DC | PRN

## 2017-10-28 NOTE — Progress Notes (Signed)
Patient voluntarily admitted reporting that she would like to quit drinking. She reported SI to shoot self with gun. She has a court date for a DWI in next month. Patient reports her stressor is taking care of her husband who is disabled.She reports drinking 3 glasses of wine daily. Admission completed and patient oriented to unit. Surgery scar on abdomen noted during skin assessment.

## 2017-10-28 NOTE — BHH Counselor (Signed)
Per Arville Go, [facility] pt has been accepted to [facility] and assigned to room/bed: 303-1, pt can come now. Discussed updated disposition with Darryll Capers, RN. Support paperwork faxed.   Vertell Novak, MS, [facility], [facility] Triage Specialist [phone]

## 2017-10-28 NOTE — ED Notes (Signed)
Pt presents with SI, plan to shoot self with gun.  Pt states she has access to guns at home.  Pt also requesting detox for alcohol abuse.  Last drink at 4pm earlier tonight.  Pt reports she averages 3 drinks/day.  Denies HI or AVH, feeling hopeless, pt admits to previous SI attempt x 4 yrs ago by ingesting pills.  Diagnosed with a hx of Depression.  A&O x 3, no distress noted, calm & cooperative.  Monitoring for safety, Q 15 min checks in effect.

## 2017-10-28 NOTE — Progress Notes (Signed)
Patient has been observed in bed asleep since shift change. Writer awakened her to offer her snacks and give her scheduled medication. She reports sweats and just being very tired and sleepy. Writer noticed her dinner tray in her room and only 1 fork of potatoes eaten. She reports decreased appetite. Safety maintained on unit with 15 min checks.
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
## 2017-10-28 NOTE — Plan of Care (Signed)
Pt is taking medications as prescribed.

## 2017-10-28 NOTE — ED Notes (Signed)
[place] transportation requested.
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
## 2017-10-28 NOTE — Progress Notes (Signed)
Patient did not attend AA group meeting. 

## 2017-10-28 NOTE — ED Notes (Signed)
Report given to RN Kennyth Lose, [facility], rm [phone].  Pending [facility] transport.

## 2017-10-28 NOTE — Progress Notes (Signed)
D:Pt has been in her room most of the morning. She reports feeling tired and c/o back pain. Pt brought a back brace to the hospital and NP was notified for orders.  A:Offered support, encouragement and 15 minute checks. Nursing orders received from NP. PRN medication given. R:Pt contracts for safety. Safety maintained on the unit.

## 2017-10-28 NOTE — Progress Notes (Signed)
NUTRITION ASSESSMENT  Pt identified as at risk on the Malnutrition Screen Tool  INTERVENTION: 1. Supplements: Ensure Enlive po BID, each supplement provides 350 kcal and 20 grams of protein  NUTRITION DIAGNOSIS: Unintentional weight loss related to sub-optimal intake as evidenced by pt report.   Goal: Pt to meet >/= 90% of their estimated nutrition needs.  Monitor:  PO intake  Assessment:  Pt admitted with depression, SI and ETOH abuse (3 glasses of wine daily). Pt with history of bariatric surgery. Pt would benefit from nutritional supplements. Will switch order to BID as weight is down since last assessment in 2016.   Height: Ht Readings from Last 1 Encounters:  10/28/17 4\' 10"  (1.473 m)    Weight: Wt Readings from Last 1 Encounters:  10/28/17 98 lb (44.5 kg)    Weight Hx: Wt Readings from Last 10 Encounters:  10/28/17 98 lb (44.5 kg)  10/27/17 97 lb 14.4 oz (44.4 kg)  10/13/17 99 lb 6 oz (45.1 kg)  05/12/17 105 lb (47.6 kg)  05/10/17 104 lb 6.4 oz (47.4 kg)  05/05/17 105 lb (47.6 kg)  04/25/17 108 lb 9.6 oz (49.3 kg)  12/27/16 104 lb 12.8 oz (47.5 kg)  08/15/16 102 lb (46.3 kg)  07/26/16 102 lb (46.3 kg)    BMI:  Body mass index is 20.48 kg/m. Pt meets criteria for normal based on current BMI.  Estimated Nutritional Needs: Kcal: 25-30 kcal/kg Protein: > 1 gram protein/kg Fluid: 1 ml/kcal  Diet Order: Diet regular Room service appropriate? Yes; Fluid consistency: Thin Pt is also offered choice of unit snacks mid-morning and mid-afternoon.  Pt is eating as desired.   Lab results and medications reviewed.   Clayton Bibles, MS, RD, [place] Dietitian Pager: [phone] After Hours Pager: [phone]

## 2017-10-28 NOTE — Tx Team (Signed)
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  The patient and family have been given the opportunity to ask questions and make suggestions.  Aurora Mask, RN 10/28/2017, 4:47 AM

## 2017-10-28 NOTE — BHH Group Notes (Signed)
[facility] LCSW Group Therapy Note  Date/Time:    10/28/2017 10:00-11:00AM  Type of Therapy and Topic:  Group Therapy:  Healthy vs Unhealthy Coping Skills  Participation Level:  None   Description of Group:  The focus of this group was to determine what unhealthy coping techniques typically are used by group members and what healthy coping techniques would be helpful in coping with various problems. Patients were guided in becoming aware of the differences between healthy and unhealthy coping techniques.  Patients were asked to identify 1-2 healthy coping skills they would like to learn to use more effectively, and many mentioned meditation, breathing, and relaxation.  These were explained, samples demonstrated, and resources shared for how to learn more at discharge.   At the end of group, additional ideas of healthy coping skills were shared in a fun exercise.  Therapeutic Goals 1. Patients learned that coping is what human beings do all day long to deal with various situations in their lives 2. Patients defined and discussed healthy vs unhealthy coping techniques 3. Patients identified their preferred coping techniques and identified whether these were healthy or unhealthy 4. Patients determined 1-2 healthy coping skills they would like to become more familiar with and use more often, and practiced a few meditations 5. Patients provided support and ideas to each other  Summary of Patient Progress: During group, patient expressed no thoughts.  She was about 30 minutes late to group, then sat quietly, at times listening and at times dozing off.  Therapeutic Modalities Cognitive Behavioral Therapy Motivational Interviewing   Selmer Dominion, LCSW 10/28/2017, 1:51 PM

## 2017-10-29 DIAGNOSIS — F332 Major depressive disorder, recurrent severe without psychotic features: Principal | ICD-10-CM

## 2017-10-29 NOTE — BHH Group Notes (Signed)
[place] Group Notes:  (Nursing)  Date:  10/29/2017  Time:  1:15 PM Type of Therapy:  Nurse Education  Participation Level:  Active  Participation Quality:  Appropriate and Attentive  Affect:  Appropriate  Cognitive:  Appropriate  Insight:  Appropriate  Engagement in Group:  Engaged  Modes of Intervention:  Activity  Summary of Progress/Problems: Patient came late to group because she wasn't feeling well earlier.   Waymond Cera 10/29/2017, 3:27 PM

## 2017-10-29 NOTE — Plan of Care (Signed)
  Problem: Education: Goal: Utilization of techniques to improve thought processes will improve Outcome: Progressing Goal: Knowledge of the prescribed therapeutic regimen will improve Outcome: Progressing   Problem: Activity: Goal: Interest or engagement in leisure activities will improve Outcome: Progressing Goal: Imbalance in normal sleep/wake cycle will improve Outcome: Progressing   Problem: Coping: Goal: Coping ability will improve Outcome: Progressing Goal: Will verbalize feelings Outcome: Progressing   Problem: Safety: Goal: Ability to disclose and discuss suicidal ideas will improve Outcome: Progressing   Problem: Self-Concept: Goal: Level of anxiety will decrease Outcome: Progressing   Problem: Education: Goal: Mental status will improve Outcome: Progressing   Problem: Activity: Goal: Interest or engagement in activities will improve Outcome: Progressing Goal: Sleeping patterns will improve Outcome: Progressing   Problem: Physical Regulation: Goal: Ability to maintain clinical measurements within normal limits will improve Outcome: Progressing   Problem: Education: Goal: Ability to make informed decisions regarding treatment will improve Outcome: Progressing   Patient indicates understanding of her treatment and what she needs to do to get those goals met.

## 2017-10-29 NOTE — Progress Notes (Signed)
Pt did not attend AA group this evening.  
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
## 2017-10-29 NOTE — Progress Notes (Signed)
D: Patient's affect is flat, blunted; her mood is depressed.  She complains of some back pain that was alleviated with tylenol.  She attended the MHT group this morning and was engaged.  She continues to have some anxiety and was administered vistaril.  She rates her depression and hopelessness as a 9; her anxiety as a 7.  She is sleeping "fair"; her appetite is "poor"; her energy level is "low"; and her concentration is "poor."  Patient did agree to drink an ensure this morning since her appetite has been poor.  Her goal today is to work on "myself."  She continues to report some withdrawal symptoms such as tremors and diarrhea.  Asked patient about her healthcare directives and she denies having anyone over her healthcare decisions.  She does have a power of attorney, but no one is designated for her healthcare decisions.  A: Continue to monitor medication management and MD orders.  Safety checks completed every 15 minutes per protocol.  Offer support and encouragement as needed.  R: Patient is receptive to staff; her behavior is appropriate.

## 2017-10-29 NOTE — Progress Notes (Signed)
Writer entered patients room and observed her lying in bed resting. Writer asked if she had been up any today and she reported that she has gone to groups. Writer inquired about her appetite and she was able to tell writer what she ate for breakfast but nothing about lunch and dinner. Writer encouraged her to eat something at all meals. She reports that she is just very tired. Writer informed hoer of medications available if needed. Support given and safety maintained on unit with 15 min checks.

## 2017-10-29 NOTE — BHH Group Notes (Signed)
[place] LCSW Group Therapy Note  Date/Time:  10/29/2017 9:00-10:00 or 10:00-11:00AM  Type of Therapy and Topic:  Group Therapy:  Healthy and Unhealthy Supports  Participation Level:  Active   Description of Group:  Patients in this group were introduced to the idea of adding a variety of healthy supports to address the various needs in their lives.Patients discussed what additional healthy supports could be helpful in their recovery and wellness after discharge in order to prevent future hospitalizations.   An emphasis was placed on using counselor, doctor, therapy groups, 12-step groups, and problem-specific support groups to expand supports.  They also worked as a group on developing a specific plan for several patients to deal with unhealthy supports through [place], psychoeducation with loved ones, and even termination of relationships.   Therapeutic Goals:   1)  discuss importance of adding supports to stay well once out of the hospital  2)  compare healthy versus unhealthy supports and identify some examples of each  3)  generate ideas and descriptions of healthy supports that can be added  4)  offer mutual support about how to address unhealthy supports  5)  encourage active participation in and adherence to discharge plan    Summary of Patient Progress:  The patient stated that current healthy supports in her life are her job and her lawyer while current unhealthy supports include her husband and herself.  The patient expressed a willingness to add whatever treatment her lawyer deems necessary to get her driver's license back as support(s) to help in her recovery journey.   Therapeutic Modalities:   Motivational Interviewing Brief Solution-Focused Therapy  Selmer Dominion, LCSW

## 2017-10-29 NOTE — BHH Group Notes (Signed)
Adult Psychoeducational Group Note  Date:  10/29/2017 Time:  10:13 AM  Group Topic/Focus:  Orientation:   The focus of this group is to educate the patient on the purpose and policies of crisis stabilization and provide a format to answer questions about their admission.  The group details unit policies and expectations of patients while admitted.  Participation Level:  Active  Participation Quality:  Appropriate  Affect:  Appropriate  Cognitive:  Appropriate  Insight: Appropriate  Engagement in Group:  Engaged  Modes of Intervention:  Discussion, Education, Exploration, Problem-solving, Socialization and Support  Additional Comments:  Pt attended Orientation/GOals group this morning. Pt stated that her goal for today is, "To talk to the social worker about my job situation."  Harriet Masson 10/29/2017, 10:13 AM

## 2017-10-30 DIAGNOSIS — Z811 Family history of alcohol abuse and dependence: Secondary | ICD-10-CM

## 2017-10-30 DIAGNOSIS — G4700 Insomnia, unspecified: Secondary | ICD-10-CM

## 2017-10-30 DIAGNOSIS — F419 Anxiety disorder, unspecified: Secondary | ICD-10-CM

## 2017-10-30 DIAGNOSIS — F1099 Alcohol use, unspecified with unspecified alcohol-induced disorder: Secondary | ICD-10-CM

## 2017-10-30 MED ORDER — PHENAZOPYRIDINE HCL 100 MG PO TABS
100.0000 mg | ORAL_TABLET | Freq: Three times a day (TID) | ORAL | Status: AC
  Administered 2017-10-30 – 2017-10-31 (×3): 100 mg via ORAL
  Filled 2017-10-30 (×4): qty 1

## 2017-10-30 MED ORDER — IBUPROFEN 600 MG PO TABS
600.0000 mg | ORAL_TABLET | Freq: Four times a day (QID) | ORAL | Status: DC | PRN
Start: 2017-10-30 — End: 2017-11-01
  Administered 2017-10-30 – 2017-10-31 (×2): 600 mg via ORAL
  Filled 2017-10-30: qty 1

## 2017-10-30 MED ORDER — NITROFURANTOIN MONOHYD MACRO 100 MG PO CAPS
100.0000 mg | ORAL_CAPSULE | Freq: Two times a day (BID) | ORAL | Status: DC
  Administered 2017-10-30 – 2017-11-01 (×5): 100 mg via ORAL
  Filled 2017-10-30 (×9): qty 1

## 2017-10-30 MED ORDER — IBUPROFEN 600 MG PO TABS
ORAL_TABLET | ORAL | Status: AC
  Filled 2017-10-30: qty 1

## 2017-10-30 MED ORDER — OXYCODONE-ACETAMINOPHEN 5-325 MG PO TABS
1.0000 | ORAL_TABLET | Freq: Once | ORAL | Status: AC
  Administered 2017-10-30: 1 via ORAL
  Filled 2017-10-30: qty 1

## 2017-10-30 MED ORDER — OXYCODONE-ACETAMINOPHEN 5-325 MG PO TABS: 1.0000 | ORAL_TABLET | Freq: Once | ORAL | Status: DC

## 2017-10-30 NOTE — BHH Group Notes (Signed)
LCSW Group Therapy Note 10/30/2017 2:47 PM  Type of Therapy and Topic: Group Therapy: Overcoming Obstacles  Participation Level: Active  Description of Group:  In this group patients will be encouraged to explore what they see as obstacles to their own wellness and recovery. They will be guided to discuss their thoughts, feelings, and behaviors related to these obstacles. The group will process together ways to cope with barriers, with attention given to specific choices patients can make. Each patient will be challenged to identify changes they are motivated to make in order to overcome their obstacles. This group will be process-oriented, with patients participating in exploration of their own experiences as well as giving and receiving support and challenge from other group members.  Therapeutic Goals: 1. Patient will identify personal and current obstacles as they relate to admission. 2. Patient will identify barriers that currently interfere with their wellness or overcoming obstacles.  3. Patient will identify feelings, thought process and behaviors related to these barriers. 4. Patient will identify two changes they are willing to make to overcome these obstacles:   Summary of Patient Progress  [name] was engaged and participated throughout the group session. [name] contributed and stated that her main obstacle was "drinking too much alcohol". [name] reports that she increased her drinking habits once her disabled husband health began to decline. [name] reports that her husband's decline in health is the main barrier keeping her from "quitting the alcohol". [name] reports that her two adult sons have become more involved in her and her husband's lives and that she plans to communicate her feelings with them so that she doesn't use her drinking habits to "numb all the nonsense". [name] states that she plans to allow her sons more access to her life so that they can help with taking care of her  husband,  in order to overcome her obstacle of drinking.   Therapeutic Modalities:  Cognitive Behavioral Therapy Solution Focused Therapy Motivational Interviewing Relapse Prevention Therapy   Theresa Duty Clinical Social Worker

## 2017-10-30 NOTE — Tx Team (Addendum)
Interdisciplinary Treatment and Diagnostic Plan Update  10/30/2017 Time of Session: 9:45am [name] MRN: [id number]  Principal Diagnosis: Severe recurrent major depression without psychotic features ([place])  Secondary Diagnoses: Principal Problem:   Severe recurrent major depression without psychotic features ([place]) Active Problems:   Generalized anxiety disorder   Alcohol use disorder, severe, dependence ([place])   Backache   Current Medications:  Current Facility-Administered Medications  Medication Dose Route Frequency Provider Last Rate Last Dose  . acetaminophen (TYLENOL) tablet 650 mg  650 mg Oral Q6H PRN Lindon Romp A, NP   650 mg at 10/30/17 0746  . alum & mag hydroxide-simeth (MAALOX/MYLANTA) 200-200-20 MG/5ML suspension 30 mL  30 mL Oral Q4H PRN Lindon Romp A, NP   30 mL at 10/29/17 1805  . escitalopram (LEXAPRO) tablet 20 mg  20 mg Oral Daily Cobos, Myer Peer, MD   20 mg at 10/30/17 0743  . feeding supplement (ENSURE ENLIVE) (ENSURE ENLIVE) liquid 237 mL  237 mL Oral BID BM Cobos, Myer Peer, MD   237 mL at 10/29/17 0827  . hydrOXYzine (ATARAX/VISTARIL) tablet 25 mg  25 mg Oral Q6H PRN Lindon Romp A, NP   25 mg at 10/29/17 1021  . lamoTRIgine (LAMICTAL) tablet 50 mg  50 mg Oral Daily Cobos, Myer Peer, MD   50 mg at 10/30/17 0742  . loperamide (IMODIUM) capsule 2-4 mg  2-4 mg Oral PRN Lindon Romp A, NP   4 mg at 10/29/17 1204  . LORazepam (ATIVAN) tablet 1 mg  1 mg Oral Q6H PRN Lindon Romp A, NP      . LORazepam (ATIVAN) tablet 1 mg  1 mg Oral BID Lindon Romp A, NP   1 mg at 10/30/17 0745   Followed by  . [START ON 10/31/2017] LORazepam (ATIVAN) tablet 1 mg  1 mg Oral Daily Lindon Romp A, NP      . magnesium hydroxide (MILK OF MAGNESIA) suspension 30 mL  30 mL Oral Daily PRN Lindon Romp A, NP      . multivitamin with minerals tablet 1 tablet  1 tablet Oral Daily Lindon Romp A, NP   1 tablet at 10/30/17 0743  . ondansetron (ZOFRAN-ODT) disintegrating tablet 4 mg  4 mg  Oral Q6H PRN Lindon Romp A, NP      . thiamine (VITAMIN B-1) tablet 100 mg  100 mg Oral Daily Lindon Romp A, NP   100 mg at 10/30/17 0743  . traZODone (DESYREL) tablet 50 mg  50 mg Oral QHS PRN Rozetta Nunnery, NP       PTA Medications: Medications Prior to Admission  Medication Sig Dispense Refill Last Dose  . clonazePAM (KLONOPIN) 1 MG tablet Take 1 mg by mouth 3 (three) times daily as needed for anxiety.   10/27/2017 at Unknown time  . b complex vitamins capsule Take 1 capsule by mouth daily.   Unknown at Unknown time  . busPIRone (BUSPAR) 10 MG tablet Take 1-2 tablets three times daily for anxiety (Patient taking differently: Take 10-20 mg by mouth 3 (three) times daily. Take 1-2 tablets three times daily for anxiety) 540 tablet 0 Unknown at Unknown time  . carvedilol (COREG) 6.25 MG tablet Take 1 tablet (6.25 mg total) by mouth 2 (two) times daily with a meal. (Patient not taking: Reported on 10/27/2017) 60 tablet 3 Unknown at Unknown time  . cephALEXin (KEFLEX) 500 MG capsule Take 1 capsule (500 mg total) by mouth 4 (four) times daily. (Patient not taking: Reported on  10/27/2017) 28 capsule 0 Unknown at Unknown time  . escitalopram (LEXAPRO) 20 MG tablet Take 30 mg by mouth daily.   Unknown at Unknown time  . hydrOXYzine (ATARAX/VISTARIL) 25 MG tablet Take 1-2 tablets PRN for anxiety (Patient taking differently: Take 25-50 mg by mouth every 8 (eight) hours as needed for anxiety. Take 1-2 tablets PRN for anxiety) 180 tablet 1 Unknown at Unknown time  . lamoTRIgine (LAMICTAL) 100 MG tablet Take 1 tablet (100 mg total) by mouth daily. 90 tablet 0 10/26/2017 at Unknown time  . Multiple Vitamin (MULTI VITAMIN PO) Take 1 tablet by mouth daily.    Unknown at Unknown time  . omeprazole (PRILOSEC) 20 MG capsule Take 1 capsule (20 mg total) by mouth daily. 90 capsule 2 Unknown at Unknown time  . oxyCODONE-acetaminophen (PERCOCET) 10-325 MG tablet Take 1 tablet by mouth every 6 (six) hours as needed for  pain.   Unknown at Unknown time  . phenazopyridine (PYRIDIUM) 95 MG tablet Take 1 tablet (95 mg total) by mouth 3 (three) times daily as needed for pain. (Patient not taking: Reported on 10/27/2017) 10 tablet 0 Completed Course at Unknown time  . trazodone (DESYREL) 300 MG tablet Take 1 tablet (300 mg total) by mouth at bedtime. 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   Physician Treatment Plan for Primary Diagnosis: Severe recurrent major depression without psychotic features ([place]) Long Term Goal(s): Improvement in symptoms so as ready for discharge Improvement in symptoms so as ready for discharge   Short Term Goals: Ability to identify changes in lifestyle to reduce recurrence of condition will improve Ability to verbalize feelings will improve Ability to disclose and discuss suicidal ideas Ability to demonstrate self-control will improve Ability to identify and develop effective coping behaviors will improve Ability to maintain clinical measurements within normal limits will improve Ability to identify changes in lifestyle to reduce recurrence of condition will improve Ability to disclose and discuss suicidal ideas Ability to maintain clinical measurements within normal limits will improve Compliance with prescribed medications will improve  Medication Management: Evaluate patient's response, side effects, and tolerance of medication regimen.  Therapeutic Interventions: 1 to 1 sessions, Unit Group sessions and Medication administration.  Evaluation of Outcomes: Not  Met  Physician Treatment Plan for Secondary Diagnosis: Principal Problem:   Severe recurrent major depression without psychotic features ([place]) Active Problems:   Generalized anxiety disorder   Alcohol use disorder, severe, dependence ([place])   Backache  Long Term Goal(s): Improvement in symptoms so as ready for discharge Improvement in symptoms so as ready for discharge   Short Term Goals: Ability to identify changes in lifestyle to reduce recurrence of condition will improve Ability to verbalize feelings will improve Ability to disclose and discuss suicidal ideas Ability to demonstrate self-control will improve Ability to identify and develop effective coping behaviors will improve Ability to maintain clinical measurements within normal limits will improve Ability to identify changes in lifestyle to reduce recurrence of condition will improve Ability to disclose and discuss suicidal ideas Ability to maintain clinical measurements within normal limits will improve Compliance with prescribed medications will improve     Medication Management: Evaluate  patient's response, side effects, and tolerance of medication regimen.  Therapeutic Interventions: 1 to 1 sessions, Unit Group sessions and Medication administration.  Evaluation of Outcomes: Not Met   RN Treatment Plan for Primary Diagnosis: Severe recurrent major depression without psychotic features ([place]) Long Term Goal(s): Knowledge of disease and therapeutic regimen to maintain health will improve  Short Term Goals: Ability to remain free from injury will improve, Ability to verbalize frustration and anger appropriately will improve, Ability to demonstrate self-control, Ability to participate in decision making will improve, Ability to verbalize feelings will improve, Ability to disclose and discuss suicidal ideas, Ability to identify and develop effective coping behaviors will improve and Compliance with prescribed medications will  improve  Medication Management: RN will administer medications as ordered by provider, will assess and evaluate patient's response and provide education to patient for prescribed medication. RN will report any adverse and/or side effects to prescribing provider.  Therapeutic Interventions: 1 on 1 counseling sessions, Psychoeducation, Medication administration, Evaluate responses to treatment, Monitor vital signs and CBGs as ordered, Perform/monitor CIWA, COWS, AIMS and Fall Risk screenings as ordered, Perform wound care treatments as ordered.  Evaluation of Outcomes: Not Met   LCSW Treatment Plan for Primary Diagnosis: Severe recurrent major depression without psychotic features ([place]) Long Term Goal(s): Safe transition to appropriate next level of care at discharge, Engage patient in therapeutic group addressing interpersonal concerns.  Short Term Goals: Engage patient in aftercare planning with referrals and resources, Increase social support, Increase ability to appropriately verbalize feelings, Increase emotional regulation, Facilitate acceptance of mental health diagnosis and concerns, Facilitate patient progression through stages of change regarding substance use diagnoses and concerns, Identify triggers associated with mental health/substance abuse issues and Increase skills for wellness and recovery  Therapeutic Interventions: Assess for all discharge needs, 1 to 1 time with Social worker, Explore available resources and support systems, Assess for adequacy in community support network, Educate family and significant other(s) on suicide prevention, Complete Psychosocial Assessment, Interpersonal group therapy.  Evaluation of Outcomes: Not Met   Progress in Treatment: Attending groups: Yes. Participating in groups: Yes. Taking medication as prescribed: Yes. Toleration medication: Yes. Family/Significant other contact made: No, will contact:  the patient's husband Patient understands  diagnosis: Yes. Discussing patient identified problems/goals with staff: Yes. Medical problems stabilized or resolved: Yes. Denies suicidal/homicidal ideation: Yes. Issues/concerns per patient self-inventory: No. Other:   New problem(s) identified: None   New Short Term/Long Term Goal(s): Detox, medication stabilization, elimination of SI thoughts, development of comprehensive mental wellness plan.   Patient Goals: "I want to get off of alcohol and learn how to focus on myself"   Discharge Plan or Barriers: Patient plans to return home and continue to follow up with Dr. Toy Care for outpatient services.   Reason for Continuation of Hospitalization: Anxiety Depression Medication stabilization  Estimated Length of Stay: Thursday, 11/02/17  Attendees: Patient: [name] 10/30/2017 9:44 AM  Physician: Dr. Neita Garnet  10/30/2017 9:44 AM  Nursing: Grayland Ormond, RN 10/30/2017 9:44 AM  RN Care Manager: Rhunette Croft 10/30/2017 9:44 AM  Social Worker: Radonna Ricker, [place] 10/30/2017 9:44 AM  Recreational Therapist: Rhunette Croft 10/30/2017 9:44 AM  Other: X 10/30/2017 9:44 AM  Other: X 10/30/2017 9:44 AM  Other: Rhunette Croft 10/30/2017 9:44 AM    Scribe for Treatment Team: Marylee Floras, [place] 10/30/2017 9:44 AM
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
## 2017-10-30 NOTE — Progress Notes (Signed)
Patient ID: [name], female   DOB: 1961-06-12, 57 y.o.   MRN: [id number]  Pt currently presents with a flat affect and cooperative behavior. Pt reports she is ready for bed at [facility]. Pt reports to writer "I just want to go to sleep." Pt states "I had an okay day today." Pt reports good sleep with current medication regimen.   Pt provided with medications per providers orders. Pt's labs and vitals were monitored throughout the night. Pt given a 1:1 about emotional and mental status. Pt supported and encouraged to express concerns and questions. Pt educated on medications. Pt encouraged to stay awake to attend AA groups and other nighttime groups in the future.   Pt's safety ensured with 15 minute and environmental checks. Pt currently denies SI/HI and A/V hallucinations. Pt verbally agrees to seek staff if SI/HI or A/VH occurs and to consult with staff before acting on any harmful thoughts. Will continue POC.

## 2017-10-30 NOTE — Progress Notes (Signed)
Pt attended group this evening. 

## 2017-10-30 NOTE — Progress Notes (Signed)
Recreation Therapy Notes Date: 3.25.19 Time: 9:30 a.m. Location: 300 Hall Dayroom   Group Topic: Stress Management   Goal Area(s) Addresses:  Goal 1.1: To reduce stress  -Patient will report feeling a reduction in stress level  -Patient will identify the importance of stress management  -Patient will participate during stress management group treatment     Behavioral Response: Engaged   Intervention: Stress Management   Activity: Meditattion- Patients were in a peaceful environment with soft lighting enhancing patients mood. Patients listened to a deep concentration voice over to decrease stress levels   Education: Stress Management, Discharge Planning.    Education Outcome: Acknowledges edcuation/In group clarification offered/Needs additional education   Clinical Observations/Feedback:: Patient attended and participated appropriately during stress management group treatment. Patient reported feeling a reduction in stress level.    Ranell Patrick, Recreation Therapy Intern   Ranell Patrick 10/30/2017 8:28 AM

## 2017-10-30 NOTE — Progress Notes (Signed)
D:  Patient's self inventory sheet, patient has fair sleep, no sleep medication given.  Poor appetite, low energy level, good concentration.  Rated depression, anxiety, hopeless 9.  Withdrawals.  Tremors, diarrhea, sweating.  Denied SI.  Physical problems, bladder pain, worst pain in past 24 hours is #9, bladder, back.  No pain medicine.  Goal is get life together.  Plans to attend groups.  No discharge plans.   A:  Medications administered per MD orders.  Emotional support and encouragement given patient. R:  Denied SI and HI, contracts for safety.  Denied A/V hallucinations.  Safety maintained with 15 minute checks.

## 2017-10-30 NOTE — Progress Notes (Signed)
Adult Psychoeducational Group Note  Date:  10/30/2017 Time:  8:47 AM  Group Topic/Focus:  Goals Group:   The focus of this group is to help patients establish daily goals to achieve during treatment and discuss how the patient can incorporate goal setting into their daily lives to aide in recovery.  Participation Level:  Minimal  Participation Quality:  Appropriate  Affect:  Appropriate  Cognitive:  Appropriate  Insight: Good  Engagement in Group:  Engaged  Modes of Intervention:  Discussion  Additional Comments:  Pt sat in group and talked about the schedule.  Pt was informed of whom her nurse is and she was told what the groups were today.  [name] Emmalyn Hinson 10/30/2017, 8:47 AM

## 2017-10-30 NOTE — Plan of Care (Signed)
Nurse discussed depression, anxiety, coping skills with patient.  

## 2017-10-31 DIAGNOSIS — Z818 Family history of other mental and behavioral disorders: Secondary | ICD-10-CM

## 2017-10-31 LAB — URINALYSIS, ROUTINE W REFLEX MICROSCOPIC
Bilirubin Urine: NEGATIVE
GLUCOSE, UA: NEGATIVE mg/dL
HGB URINE DIPSTICK: NEGATIVE
Ketones, ur: 5 mg/dL — AB
NITRITE: NEGATIVE
PH: 7 (ref 5.0–8.0)
Protein, ur: NEGATIVE mg/dL
Specific Gravity, Urine: 1.028 (ref 1.005–1.030)

## 2017-10-31 MED ORDER — HYDROXYZINE HCL 25 MG PO TABS: 25.0000 mg | ORAL_TABLET | Freq: Four times a day (QID) | ORAL | Status: DC | PRN

## 2017-10-31 MED ORDER — LORAZEPAM 1 MG PO TABS: 1.0000 mg | ORAL_TABLET | Freq: Three times a day (TID) | ORAL | Status: DC

## 2017-10-31 MED ORDER — ADULT MULTIVITAMIN W/MINERALS CH
1.0000 | ORAL_TABLET | Freq: Every day | ORAL | Status: DC
  Administered 2017-10-31 – 2017-11-01 (×2): 1 via ORAL
  Filled 2017-10-31 (×4): qty 1

## 2017-10-31 MED ORDER — LORAZEPAM 1 MG PO TABS: 1.0000 mg | ORAL_TABLET | Freq: Four times a day (QID) | ORAL | Status: DC | PRN

## 2017-10-31 MED ORDER — ONDANSETRON 4 MG PO TBDP: 4.0000 mg | ORAL_TABLET | Freq: Four times a day (QID) | ORAL | Status: DC | PRN

## 2017-10-31 MED ORDER — LORAZEPAM 1 MG PO TABS: 1.0000 mg | ORAL_TABLET | Freq: Two times a day (BID) | ORAL | Status: DC

## 2017-10-31 MED ORDER — DEXMEDETOMIDINE HCL IN NACL 200 MCG/50ML IV SOLN: 0.2000 ug/kg/h | INTRAVENOUS | Status: DC

## 2017-10-31 MED ORDER — LOPERAMIDE HCL 2 MG PO CAPS: 2.0000 mg | ORAL_CAPSULE | ORAL | Status: DC | PRN

## 2017-10-31 MED ORDER — MUPIROCIN 2 % EX OINT
TOPICAL_OINTMENT | Freq: Two times a day (BID) | CUTANEOUS | Status: DC
  Administered 2017-10-31: 1 via NASAL
  Administered 2017-11-01: 08:00:00 via NASAL
  Filled 2017-10-31: qty 22

## 2017-10-31 MED ORDER — VITAMIN B-1 100 MG PO TABS
100.0000 mg | ORAL_TABLET | Freq: Every day | ORAL | Status: DC
  Administered 2017-11-01: 100 mg via ORAL
  Filled 2017-10-31 (×3): qty 1

## 2017-10-31 MED ORDER — HYDROXYZINE HCL 25 MG PO TABS
25.0000 mg | ORAL_TABLET | Freq: Four times a day (QID) | ORAL | Status: DC | PRN
  Administered 2017-10-31: 25 mg via ORAL
  Filled 2017-10-31: qty 1

## 2017-10-31 MED ORDER — LORAZEPAM 1 MG PO TABS: 1.0000 mg | ORAL_TABLET | Freq: Every day | ORAL | Status: DC

## 2017-10-31 MED ORDER — OXYCODONE-ACETAMINOPHEN 5-325 MG PO TABS
1.0000 | ORAL_TABLET | Freq: Three times a day (TID) | ORAL | Status: DC | PRN
  Administered 2017-10-31 – 2017-11-01 (×3): 1 via ORAL
  Filled 2017-10-31 (×3): qty 1

## 2017-10-31 MED ORDER — LORAZEPAM 1 MG PO TABS
ORAL_TABLET | ORAL | Status: AC
  Administered 2017-10-31: 1 mg
  Filled 2017-10-31: qty 1

## 2017-10-31 MED ORDER — OXYCODONE-ACETAMINOPHEN 5-325 MG PO TABS: 1.0000 | ORAL_TABLET | Freq: Once | ORAL | Status: DC

## 2017-10-31 MED ORDER — LORAZEPAM 1 MG PO TABS
1.0000 mg | ORAL_TABLET | Freq: Four times a day (QID) | ORAL | Status: DC
  Administered 2017-10-31 – 2017-11-01 (×4): 1 mg via ORAL
  Filled 2017-10-31 (×4): qty 1

## 2017-10-31 NOTE — Plan of Care (Signed)
Nurse discussed anxiety, depression, coping skills with patient. 
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
## 2017-10-31 NOTE — BHH Group Notes (Signed)
[facility] Mental Health Association Group Therapy      10/31/2017 3:05 PM  Type of Therapy: Mental Health Association Presentation  Participation Level: Active  Participation Quality: Attentive  Affect: Appropriate  Cognitive: Oriented  Insight: Developing/Improving  Engagement in Therapy: Engaged  Modes of Intervention: Discussion, Education and Socialization  Summary of Progress/Problems:   [place] ([place] Speaker came to talk about his personal journey with mental health. The pt processed ways by which to relate to the speaker. [place] speaker provided handouts and educational information pertaining to groups and services offered by the [facility]. Pt was engaged in speaker's presentation and was receptive to resources provided.    [place] Social Worker

## 2017-10-31 NOTE — Progress Notes (Signed)
Pt attended morning orientation/goals group and stated that her goal is to get herself together and discuss with social worker a plan to get herself back to work and speaking with her lawyer.

## 2017-10-31 NOTE — BHH Group Notes (Signed)
[facility] Group Notes:  (Nursing/MHT/Case Management/Adjunct)  Date:  10/31/2017  Time:  4:00 pm  Type of Therapy:  Group Therapy  Participation Level:  Did Not Attend  Participation Quality:    Affect:    Cognitive:    Insight:   Engagement in Group:    Modes of Intervention:    Summary of Progress/Problems:  [name] 10/31/2017, 7:52 PM

## 2017-10-31 NOTE — Progress Notes (Signed)
D:  Patient's self inventory sheet, patient has fair sleep, sleep medication helpful.  Poor appetite, low energy level, poor concentration.  Rated depression, hopeless and anxiety 9.  Withdrawals, tremors, irritability.  Denied SI.  Physical problems, back pain, worst pain in past 24 hours is #6.  No pain medicine.  Goal is "get myself back together."  Plans "anything".  No discharge plans. A:  Medications administered per MD orders.  Emotional support and encouragement given patient. R:  Denied SI and HI, contracts for safety.  Denied A/V hallucinations.  Safety maintained with 15 minute checks.

## 2017-10-31 NOTE — Progress Notes (Signed)
Patient learned of her husband's death this afternoon.  Family visited patient this afternoon.

## 2017-11-01 MED ORDER — ACAMPROSATE CALCIUM 333 MG PO TBEC
666.0000 mg | DELAYED_RELEASE_TABLET | Freq: Three times a day (TID) | ORAL | Status: DC
  Administered 2017-11-01 (×2): 666 mg via ORAL
  Filled 2017-11-01 (×9): qty 2

## 2017-11-01 MED ORDER — TRAZODONE HCL 50 MG PO TABS
50.0000 mg | ORAL_TABLET | Freq: Every evening | ORAL | 0 refills | Status: DC | PRN
Start: 2017-11-01 — End: 2018-09-26

## 2017-11-01 MED ORDER — ACAMPROSATE CALCIUM 333 MG PO TBEC: 666.0000 mg | DELAYED_RELEASE_TABLET | Freq: Three times a day (TID) | ORAL | 0 refills | Status: DC

## 2017-11-01 MED ORDER — OXYCODONE-ACETAMINOPHEN 5-325 MG PO TABS: 1.0000 | ORAL_TABLET | Freq: Three times a day (TID) | ORAL | 0 refills | Status: DC | PRN

## 2017-11-01 MED ORDER — MUPIROCIN 2 % EX OINT: TOPICAL_OINTMENT | Freq: Two times a day (BID) | CUTANEOUS | 0 refills | Status: DC

## 2017-11-01 MED ORDER — NITROFURANTOIN MONOHYD MACRO 100 MG PO CAPS: 100.0000 mg | ORAL_CAPSULE | Freq: Two times a day (BID) | ORAL | 0 refills | Status: DC

## 2017-11-01 MED ORDER — LAMOTRIGINE 25 MG PO TABS: 50.0000 mg | ORAL_TABLET | Freq: Every day | ORAL | 0 refills | Status: DC

## 2017-11-01 MED ORDER — OMEPRAZOLE 20 MG PO CPDR: 20.0000 mg | DELAYED_RELEASE_CAPSULE | Freq: Every day | ORAL | 0 refills | Status: DC

## 2017-11-01 MED ORDER — ESCITALOPRAM OXALATE 20 MG PO TABS
20.0000 mg | ORAL_TABLET | Freq: Every day | ORAL | 0 refills | Status: DC
Start: 2017-11-02 — End: 2018-07-17

## 2017-11-01 MED ORDER — CARVEDILOL 6.25 MG PO TABS: 6.2500 mg | ORAL_TABLET | Freq: Two times a day (BID) | ORAL | 0 refills | Status: DC

## 2017-11-01 MED ORDER — HYDROXYZINE HCL 25 MG PO TABS: ORAL_TABLET | ORAL | 0 refills | Status: DC

## 2017-11-01 MED ORDER — LORAZEPAM 1 MG PO TABS: 1.0000 mg | ORAL_TABLET | Freq: Every day | ORAL | 0 refills | Status: DC
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
## 2017-11-01 NOTE — Progress Notes (Signed)
Recreation Therapy Notes  Date: 3.27.19 Time: 9:30 a.m. Location: 300 [address] Dayroom   Group Topic: Stress Management   Goal Area(s) Addresses:  Goal 1.1: To reduce stress  -Patient will report feeling a reduction in stress level  -Patient will identify the importance of stress management  -Patient will participate during stress management group treatment     Behavioral Response: Engaged   Intervention: Stress Management   Activity: Guided Imagery- Patients were in a peaceful environment with soft lighting enhancing patients mood. Patients were read a guided imagery script to help decrease stress levels   Education: Stress Management, Discharge Planning.    Education Outcome: Acknowledges edcuation/In group clarification offered/Needs additional education   Clinical Observations/Feedback:: Patient attended and participated appropriately during stress management group treatment. Patient reported feeling a reduction in stress level.    Ranell Patrick, Recreation Therapy Intern   Ranell Patrick 11/01/2017 8:24 AM

## 2017-11-01 NOTE — Progress Notes (Signed)
D: Patient observed up and visible in the milieu. Patient states she is still dealing with the news of her husband's death. Patient's affect appropriate to situation, sad, flat, slightly tearful with congruent mood. Per self inventory and discussions with writer, rates depression at a 7/10, hopelessness at a 9/10 and anxiety at a 9/10. Rates sleep as fair, appetite as poor, energy as low and concentration as [poor.  States goal for today is to work on myself. Reports chronic back pain of a 6/10, no other physical complaints.   A: Medicated per orders, prn percocet given for discomfort. Level III obs in place for safety. Emotional support offered and self inventory reviewed. Encouraged completion of Suicide Safety Plan and programming participation. Discussed POC with MD, SW.    R: Patient verbalizes understanding of POC. On reassess, patient denies pain.. Patient denies SI/HI/AVH and remains safe on level III obs. Will continue to monitor closely and make verbal contact frequently. Awaiting discharge.

## 2017-11-01 NOTE — Progress Notes (Signed)
  [facility] Adult Case Management Discharge Plan :  Will you be returning to the same living situation after discharge:  Yes,  patient plans to reuturn her home with her adult sons. Patient's husband passed away while she was hospitalized.  At discharge, do you have transportation home?: Yes,  patient reports her son and daughter-in-law will pick her up at discharge Do you have the ability to pay for your medications: Yes,  BCBS  Release of information consent forms completed and in the chart;  Patient's signature needed at discharge.  Patient to Follow up at: Follow-up Information    Chucky May, MD Follow up.   Specialty:  Psychiatry Contact information: [address] [place] [address] [phone]           Next level of care provider has access to [place] and Suicide Prevention discussed: Yes,  with the patient  Have you used any form of tobacco in the last 30 days? (Cigarettes, Smokeless Tobacco, Cigars, and/or Pipes): No  Has patient been referred to the Quitline?: N/A patient is not a smoker  Patient has been referred for addiction treatment: Pt. refused referral  Marylee Floras, [place] 11/01/2017, 10:21 AM

## 2017-11-01 NOTE — Progress Notes (Signed)
DAR Note: Patient isolated in her room most shift. Tearful and anxious over the death of her husband. Emotional support offered and encouraged to verbalize feelings and needs to staff. Patient denies pain, SI/HI, AH/VH at this time. CIWA rated 2. Patient contracts for safety.  Patient in bed, appear sleeping at this time. Will continue to monitor patient.

## 2017-11-01 NOTE — [facility] Suicide Risk Assessment (Signed)
[facility] INPATIENT:  Family/Significant Other Suicide Prevention Education  Suicide Prevention Education:   SPE completed with patient, as patient refused to consent to family contact. SPI pamphlet provided to pt and pt was encouraged to share information with support network, ask questions, and talk about any concerns relating to SPE. Patient denies access to guns/firearms and verbalized understanding of information provided. Mobile Crisis information also provided to patient.   
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
## 2017-11-27 DIAGNOSIS — F419 Anxiety disorder, unspecified: Secondary | ICD-10-CM | POA: Diagnosis not present

## 2017-12-05 DIAGNOSIS — F3342 Major depressive disorder, recurrent, in full remission: Secondary | ICD-10-CM | POA: Diagnosis not present

## 2017-12-05 DIAGNOSIS — F410 Panic disorder [episodic paroxysmal anxiety] without agoraphobia: Secondary | ICD-10-CM | POA: Diagnosis not present

## 2017-12-05 DIAGNOSIS — F4322 Adjustment disorder with anxiety: Secondary | ICD-10-CM | POA: Diagnosis not present

## 2017-12-14 DIAGNOSIS — M5417 Radiculopathy, lumbosacral region: Secondary | ICD-10-CM | POA: Diagnosis not present

## 2017-12-14 DIAGNOSIS — G250 Essential tremor: Secondary | ICD-10-CM | POA: Diagnosis not present

## 2017-12-14 DIAGNOSIS — G5603 Carpal tunnel syndrome, bilateral upper limbs: Secondary | ICD-10-CM | POA: Diagnosis not present

## 2017-12-14 DIAGNOSIS — M5412 Radiculopathy, cervical region: Secondary | ICD-10-CM | POA: Diagnosis not present

## 2018-01-24 DIAGNOSIS — F3342 Major depressive disorder, recurrent, in full remission: Secondary | ICD-10-CM | POA: Diagnosis not present

## 2018-01-24 DIAGNOSIS — F410 Panic disorder [episodic paroxysmal anxiety] without agoraphobia: Secondary | ICD-10-CM | POA: Diagnosis not present

## 2018-02-12 ENCOUNTER — Observation Stay (HOSPITAL_COMMUNITY)
Admission: EM | Admit: 2018-02-12 | Discharge: 2018-02-14 | Disposition: A | Discharge status: Home / Self Care | Payer: BLUE CROSS/BLUE SHIELD | Attending: Internal Medicine | Admitting: Internal Medicine

## 2018-02-12 ENCOUNTER — Other Ambulatory Visit: Payer: Self-pay

## 2018-02-12 ENCOUNTER — Encounter (HOSPITAL_COMMUNITY): Payer: Self-pay | Admitting: *Deleted

## 2018-02-12 DIAGNOSIS — Y908 Blood alcohol level of 240 mg/100 ml or more: Secondary | ICD-10-CM | POA: Insufficient documentation

## 2018-02-12 DIAGNOSIS — F1020 Alcohol dependence, uncomplicated: Secondary | ICD-10-CM | POA: Diagnosis present

## 2018-02-12 DIAGNOSIS — F32A Depression, unspecified: Secondary | ICD-10-CM | POA: Diagnosis present

## 2018-02-12 DIAGNOSIS — Z79899 Other long term (current) drug therapy: Secondary | ICD-10-CM | POA: Diagnosis not present

## 2018-02-12 DIAGNOSIS — R000 Tachycardia, unspecified: Secondary | ICD-10-CM

## 2018-02-12 DIAGNOSIS — F329 Major depressive disorder, single episode, unspecified: Secondary | ICD-10-CM | POA: Diagnosis not present

## 2018-02-12 DIAGNOSIS — F411 Generalized anxiety disorder: Secondary | ICD-10-CM | POA: Diagnosis present

## 2018-02-12 DIAGNOSIS — E872 Acidosis, unspecified: Secondary | ICD-10-CM

## 2018-02-12 DIAGNOSIS — R4182 Altered mental status, unspecified: Secondary | ICD-10-CM | POA: Diagnosis present

## 2018-02-12 DIAGNOSIS — R400 Somnolence: Secondary | ICD-10-CM

## 2018-02-12 DIAGNOSIS — F419 Anxiety disorder, unspecified: Secondary | ICD-10-CM | POA: Diagnosis not present

## 2018-02-12 DIAGNOSIS — F10129 Alcohol abuse with intoxication, unspecified: Secondary | ICD-10-CM | POA: Diagnosis not present

## 2018-02-12 DIAGNOSIS — I10 Essential (primary) hypertension: Principal | ICD-10-CM | POA: Diagnosis present

## 2018-02-12 DIAGNOSIS — F10929 Alcohol use, unspecified with intoxication, unspecified: Secondary | ICD-10-CM | POA: Diagnosis not present

## 2018-02-12 DIAGNOSIS — F4489 Other dissociative and conversion disorders: Secondary | ICD-10-CM | POA: Diagnosis not present

## 2018-02-12 NOTE — ED Triage Notes (Signed)
Pt brought in by altered mental status; pt will awake to voice; pt denies any pain

## 2018-02-13 ENCOUNTER — Encounter (HOSPITAL_COMMUNITY): Payer: Self-pay | Admitting: *Deleted

## 2018-02-13 ENCOUNTER — Other Ambulatory Visit: Payer: Self-pay

## 2018-02-13 DIAGNOSIS — F10929 Alcohol use, unspecified with intoxication, unspecified: Secondary | ICD-10-CM | POA: Diagnosis not present

## 2018-02-13 DIAGNOSIS — E872 Acidosis, unspecified: Secondary | ICD-10-CM | POA: Diagnosis present

## 2018-02-13 DIAGNOSIS — I10 Essential (primary) hypertension: Secondary | ICD-10-CM | POA: Diagnosis not present

## 2018-02-13 DIAGNOSIS — F1020 Alcohol dependence, uncomplicated: Secondary | ICD-10-CM | POA: Diagnosis not present

## 2018-02-13 DIAGNOSIS — F411 Generalized anxiety disorder: Secondary | ICD-10-CM | POA: Diagnosis not present

## 2018-02-13 LAB — CBC WITH DIFFERENTIAL/PLATELET
BASOS ABS: 0 10*3/uL (ref 0.0–0.1)
Basophils Relative: 1 %
EOS ABS: 0 10*3/uL (ref 0.0–0.7)
EOS PCT: 0 %
HCT: 46.5 % — ABNORMAL HIGH (ref 36.0–46.0)
Hemoglobin: 15.4 g/dL — ABNORMAL HIGH (ref 12.0–15.0)
Lymphocytes Relative: 38 %
Lymphs Abs: 2 10*3/uL (ref 0.7–4.0)
MCH: 30.2 pg (ref 26.0–34.0)
MCHC: 33.1 g/dL (ref 30.0–36.0)
MCV: 91.2 fL (ref 78.0–100.0)
Monocytes Absolute: 0.4 10*3/uL (ref 0.1–1.0)
Monocytes Relative: 9 %
NEUTROS PCT: 52 %
Neutro Abs: 2.7 10*3/uL (ref 1.7–7.7)
PLATELETS: 233 10*3/uL (ref 150–400)
RBC: 5.1 MIL/uL (ref 3.87–5.11)
RDW: 15.2 % (ref 11.5–15.5)
WBC: 5.2 10*3/uL (ref 4.0–10.5)

## 2018-02-13 LAB — LACTIC ACID, PLASMA
LACTIC ACID, VENOUS: 2.5 mmol/L — AB (ref 0.5–1.9)
LACTIC ACID, VENOUS: 2.6 mmol/L — AB (ref 0.5–1.9)
Lactic Acid, Venous: 3.1 mmol/L (ref 0.5–1.9)
Lactic Acid, Venous: 3 mmol/L (ref 0.5–1.9)
Lactic Acid, Venous: 3.4 mmol/L (ref 0.5–1.9)

## 2018-02-13 LAB — URINALYSIS, ROUTINE W REFLEX MICROSCOPIC
BILIRUBIN URINE: NEGATIVE
Glucose, UA: NEGATIVE mg/dL
Hgb urine dipstick: NEGATIVE
Ketones, ur: NEGATIVE mg/dL
LEUKOCYTES UA: NEGATIVE
Nitrite: NEGATIVE
PH: 7 (ref 5.0–8.0)
Protein, ur: NEGATIVE mg/dL
SPECIFIC GRAVITY, URINE: 1.014 (ref 1.005–1.030)

## 2018-02-13 LAB — COMPREHENSIVE METABOLIC PANEL
ALK PHOS: 53 U/L (ref 38–126)
ALT: 23 U/L (ref 0–44)
ANION GAP: 12 (ref 5–15)
AST: 43 U/L — ABNORMAL HIGH (ref 15–41)
Albumin: 4.2 g/dL (ref 3.5–5.0)
BUN: 9 mg/dL (ref 6–20)
CALCIUM: 8.8 mg/dL — AB (ref 8.9–10.3)
CO2: 28 mmol/L (ref 22–32)
CREATININE: 0.43 mg/dL — AB (ref 0.44–1.00)
Chloride: 104 mmol/L (ref 98–111)
GFR calc non Af Amer: >60 mL/min (ref >60)
Glucose, Bld: 82 mg/dL (ref 70–99)
Potassium: 3.8 mmol/L (ref 3.5–5.1)
Sodium: 144 mmol/L (ref 135–145)
TOTAL PROTEIN: 7.2 g/dL (ref 6.5–8.1)
Total Bilirubin: 0.4 mg/dL (ref 0.3–1.2)

## 2018-02-13 LAB — AMMONIA: AMMONIA: 36 umol/L — AB (ref 9–35)

## 2018-02-13 LAB — CREATININE, SERUM
Creatinine, Ser: 0.41 mg/dL — ABNORMAL LOW (ref 0.44–1.00)
GFR calc non Af Amer: >60 mL/min (ref >60)

## 2018-02-13 LAB — RAPID URINE DRUG SCREEN, HOSP PERFORMED
Amphetamines: NOT DETECTED
BENZODIAZEPINES: POSITIVE — AB
COCAINE: NOT DETECTED
Opiates: NOT DETECTED
TETRAHYDROCANNABINOL: NOT DETECTED

## 2018-02-13 LAB — CK: Total CK: 70 U/L (ref 38–234)

## 2018-02-13 LAB — ACETAMINOPHEN LEVEL

## 2018-02-13 LAB — ETHANOL: Alcohol, Ethyl (B): 309 mg/dL (ref <10)

## 2018-02-13 LAB — SALICYLATE LEVEL

## 2018-02-13 LAB — TROPONIN I: Troponin I: <0.03 ng/mL (ref <0.03)

## 2018-02-13 MED ORDER — ONDANSETRON HCL 4 MG/2ML IJ SOLN: 4.0000 mg | Freq: Four times a day (QID) | INTRAMUSCULAR | Status: DC | PRN

## 2018-02-13 MED ORDER — OXYCODONE-ACETAMINOPHEN 5-325 MG PO TABS
2.0000 | ORAL_TABLET | Freq: Three times a day (TID) | ORAL | Status: DC | PRN
  Administered 2018-02-13 – 2018-02-14 (×2): 2 via ORAL
  Filled 2018-02-13 (×2): qty 2

## 2018-02-13 MED ORDER — ENOXAPARIN SODIUM 40 MG/0.4ML ~~LOC~~ SOLN
40.0000 mg | SUBCUTANEOUS | Status: DC
  Administered 2018-02-13: 40 mg via SUBCUTANEOUS
  Filled 2018-02-13: qty 0.4

## 2018-02-13 MED ORDER — SODIUM CHLORIDE 0.9 % IV BOLUS
1000.0000 mL | Freq: Once | INTRAVENOUS | Status: AC
  Administered 2018-02-13: 1000 mL via INTRAVENOUS

## 2018-02-13 MED ORDER — TRAZODONE HCL 50 MG PO TABS
50.0000 mg | ORAL_TABLET | Freq: Every evening | ORAL | Status: DC | PRN
  Administered 2018-02-13: 50 mg via ORAL
  Filled 2018-02-13: qty 1

## 2018-02-13 MED ORDER — OXYCODONE-ACETAMINOPHEN 5-325 MG PO TABS
1.0000 | ORAL_TABLET | Freq: Three times a day (TID) | ORAL | Status: DC | PRN
  Administered 2018-02-13: 1 via ORAL
  Filled 2018-02-13: qty 1

## 2018-02-13 MED ORDER — LAMOTRIGINE 100 MG PO TABS
50.0000 mg | ORAL_TABLET | Freq: Every day | ORAL | Status: DC
  Administered 2018-02-13 – 2018-02-14 (×2): 50 mg via ORAL
  Filled 2018-02-13 (×2): qty 1

## 2018-02-13 MED ORDER — FOLIC ACID 1 MG PO TABS
1.0000 mg | ORAL_TABLET | Freq: Every day | ORAL | Status: DC
Start: 2018-02-13 — End: 2018-02-14
  Administered 2018-02-13 – 2018-02-14 (×2): 1 mg via ORAL
  Filled 2018-02-13 (×2): qty 1

## 2018-02-13 MED ORDER — LORAZEPAM 1 MG PO TABS: 1.0000 mg | ORAL_TABLET | Freq: Four times a day (QID) | ORAL | Status: DC | PRN

## 2018-02-13 MED ORDER — LORAZEPAM 1 MG PO TABS
1.0000 mg | ORAL_TABLET | Freq: Two times a day (BID) | ORAL | Status: DC | PRN
  Administered 2018-02-13: 1 mg via ORAL
  Filled 2018-02-13: qty 1

## 2018-02-13 MED ORDER — ONDANSETRON HCL 4 MG PO TABS: 4.0000 mg | ORAL_TABLET | Freq: Four times a day (QID) | ORAL | Status: DC | PRN

## 2018-02-13 MED ORDER — LORAZEPAM 2 MG/ML IJ SOLN: 1.0000 mg | Freq: Four times a day (QID) | INTRAMUSCULAR | Status: DC | PRN

## 2018-02-13 MED ORDER — CARVEDILOL 3.125 MG PO TABS
6.2500 mg | ORAL_TABLET | Freq: Two times a day (BID) | ORAL | Status: DC
  Administered 2018-02-13 – 2018-02-14 (×2): 6.25 mg via ORAL
  Filled 2018-02-13 (×2): qty 2

## 2018-02-13 MED ORDER — HYDROXYZINE HCL 25 MG PO TABS: 25.0000 mg | ORAL_TABLET | Freq: Four times a day (QID) | ORAL | Status: DC | PRN

## 2018-02-13 MED ORDER — ACETAMINOPHEN 325 MG PO TABS
650.0000 mg | ORAL_TABLET | Freq: Four times a day (QID) | ORAL | Status: DC | PRN
  Administered 2018-02-13: 650 mg via ORAL
  Filled 2018-02-13: qty 2

## 2018-02-13 MED ORDER — LORAZEPAM 2 MG/ML IJ SOLN
0.0000 mg | Freq: Four times a day (QID) | INTRAMUSCULAR | Status: DC
  Administered 2018-02-13 (×2): 2 mg via INTRAVENOUS
  Filled 2018-02-13: qty 2
  Filled 2018-02-13: qty 1

## 2018-02-13 MED ORDER — LORAZEPAM 1 MG PO TABS: 1.0000 mg | ORAL_TABLET | Freq: Every day | ORAL | Status: DC

## 2018-02-13 MED ORDER — PANTOPRAZOLE SODIUM 40 MG PO TBEC
40.0000 mg | DELAYED_RELEASE_TABLET | Freq: Every day | ORAL | Status: DC
  Administered 2018-02-13 – 2018-02-14 (×2): 40 mg via ORAL
  Filled 2018-02-13 (×2): qty 1

## 2018-02-13 MED ORDER — ENOXAPARIN SODIUM 30 MG/0.3ML ~~LOC~~ SOLN
30.0000 mg | SUBCUTANEOUS | Status: DC
  Administered 2018-02-14: 30 mg via SUBCUTANEOUS
  Filled 2018-02-13: qty 0.3

## 2018-02-13 MED ORDER — LORAZEPAM 2 MG/ML IJ SOLN: 0.0000 mg | Freq: Two times a day (BID) | INTRAMUSCULAR | Status: DC

## 2018-02-13 MED ORDER — ESCITALOPRAM OXALATE 10 MG PO TABS
20.0000 mg | ORAL_TABLET | Freq: Every day | ORAL | Status: DC
  Administered 2018-02-13 – 2018-02-14 (×2): 20 mg via ORAL
  Filled 2018-02-13 (×2): qty 2

## 2018-02-13 MED ORDER — ACETAMINOPHEN 650 MG RE SUPP: 650.0000 mg | Freq: Four times a day (QID) | RECTAL | Status: DC | PRN

## 2018-02-13 MED ORDER — ADULT MULTIVITAMIN W/MINERALS CH
1.0000 | ORAL_TABLET | Freq: Every day | ORAL | Status: DC
  Administered 2018-02-13 – 2018-02-14 (×2): 1 via ORAL
  Filled 2018-02-13 (×2): qty 1

## 2018-02-13 MED ORDER — LORAZEPAM 2 MG/ML IJ SOLN
2.0000 mg | Freq: Once | INTRAMUSCULAR | Status: AC
  Administered 2018-02-13: 2 mg via INTRAVENOUS
  Filled 2018-02-13: qty 1

## 2018-02-13 MED ORDER — THIAMINE HCL 100 MG/ML IJ SOLN
100.0000 mg | Freq: Every day | INTRAMUSCULAR | Status: DC
  Filled 2018-02-13: qty 2

## 2018-02-13 MED ORDER — VITAMIN B-1 100 MG PO TABS
100.0000 mg | ORAL_TABLET | Freq: Every day | ORAL | Status: DC
  Administered 2018-02-13 – 2018-02-14 (×2): 100 mg via ORAL
  Filled 2018-02-13 (×2): qty 1

## 2018-02-13 MED ORDER — LACTATED RINGERS IV SOLN
INTRAVENOUS | Status: DC
  Administered 2018-02-13 – 2018-02-14 (×3): via INTRAVENOUS

## 2018-02-13 NOTE — ED Notes (Signed)
Pt states she does not want her family knowing anything about her care.

## 2018-02-13 NOTE — ED Notes (Signed)
Pt very anxious, pacing the room and walking out into the hallways. Pt shaky. Pt escorted back to room. Pt requesting "something to help me calm down". Dr. Roderic Palau paged.

## 2018-02-13 NOTE — H&P (Signed)
History and Physical    [name]:[id number] DOB: 24-May-1961 DOA: 02/12/2018  PCP: Sharion Balloon, FNP  Patient coming from: home  I have personally briefly reviewed patient's old medical records in [place]  Chief Complaint: altered mental status  HPI: [name] is a 57 y.o. female with medical history significant of alcohol abuse, HTN, depression and anxiety.  Patient does not have any recollection of events that led her to the hospital. According to reports, patient had not let her dogs back into the house last night, so her neighbor called police. When police arrived, patient did not answer the door. Neighbor let them in and patient was found to be lethargic. She reports that she had been drinking alcohol and drank more than what she normally drinks. She drinks fairly regularly and does not that she becomes tremulous if she does not drink. She denies taking any extra medications or new medications. She is on ativan for anxiety and reports taking only prescribed dose. She denies any fever, cough, shortness of breath or vomiting. Initially she denied any diarrhea, but later told staff that she has had 5 stools today. She reports her po intake has been normal for her. She has not felt well over last few days. She has been feeling more depressed lately. Denies any SI at his time.  ED Course: on arrival to ED, she was mildly tachycardic and ETOH level was noted to 309. Lactic acid was elevated at 3, and remained elevated despite aggressive hydration. No obvious source of infection and she did not look toxic. She has been referred for admission  Review of Systems: As per HPI otherwise 10 point review of systems negative.    Past Medical History:  Diagnosis Date  . Alcohol dependence with alcohol-induced mood disorder ([place]) 07/2016  . Chest pain   . Depression   . GERD (gastroesophageal reflux disease)   . HTN (hypertension)    resolved after sleeve  . Seizures ([place])    patient denies    Past Surgical History:  Procedure Laterality Date  . ABDOMINAL HYSTERECTOMY    . BACK SURGERY    . FOOT SURGERY Right 9/10   right foot-bunion-hammertoe  . Lapidus fusion Left 08/02/12  . METATARSAL OSTEOTOMY WITH BUNIONECTOMY Left 08/02/12   McBride  . sleeve gastrectomy  03/2014   [facility]. Starting weight 192  . TUBAL LIGATION    . Tubes in ears       reports that she has never smoked. She has never used smokeless tobacco. She reports that she drinks about 1.8 oz of alcohol per week. She reports that she does not use drugs.  Allergies  Allergen Reactions  . Amlodipine Swelling    Ankle swelling   . Coreg [Carvedilol] Diarrhea  . Cortisporin [Neomycin-Polymyxin-Hc] Itching and Rash    Family History  Problem Relation Age of Onset  . Hypertension Mother   . Cancer Mother        kidney  . Diabetes Father   . Hypertension Father   . Colon cancer Neg Hx   . Colon polyps Neg Hx      Prior to Admission medications   Medication Sig Start Date End Date Taking? Authorizing Provider  acamprosate (CAMPRAL) 333 MG tablet Take 2 tablets (666 mg total) by mouth 3 (three) times daily with meals. For alcoholism 11/01/17   Lindell Spar I, NP  carvedilol (COREG) 6.25 MG tablet Take 1 tablet (6.25 mg total) by mouth  2 (two) times daily with a meal. For high blood pressure 11/01/17   Nwoko, Herbert Pun I, NP  escitalopram (LEXAPRO) 20 MG tablet Take 1 tablet (20 mg total) by mouth daily. For depression 11/02/17   Lindell Spar I, NP  hydrOXYzine (ATARAX/VISTARIL) 25 MG tablet Take 1 tablet (25 mg) by mouth Q 6 hours prn 11/01/17   Lindell Spar I, NP  lamoTRIgine (LAMICTAL) 25 MG tablet Take 2 tablets (50 mg total) by mouth daily. For mood stabilization 11/02/17   Nwoko, Herbert Pun I, NP  LORazepam (ATIVAN) 1 MG tablet Take 1 tablet (1 mg total) by mouth daily. For severe anxiety due bereavement 11/04/17   Lindell Spar I, NP  mupirocin ointment (BACTROBAN) 2 % Place into the  nose 2 (two) times daily. For wound care 11/01/17   Lindell Spar I, NP  nitrofurantoin, macrocrystal-monohydrate, (MACROBID) 100 MG capsule Take 1 capsule (100 mg total) by mouth every 12 (twelve) hours. For urinary tract infection 11/01/17   Lindell Spar I, NP  omeprazole (PRILOSEC) 20 MG capsule Take 1 capsule (20 mg total) by mouth daily. For acid reflux 11/01/17   Lindell Spar I, NP  oxyCODONE-acetaminophen (PERCOCET/ROXICET) 5-325 MG tablet Take 1 tablet by mouth every 8 (eight) hours as needed for moderate pain. 11/01/17   Lindell Spar I, NP  traZODone (DESYREL) 50 MG tablet Take 1 tablet (50 mg total) by mouth at bedtime as needed for sleep. 11/01/17   Lindell Spar I, NP    Physical Exam: Vitals:   02/13/18 0530 02/13/18 0600 02/13/18 0630 02/13/18 1021  BP: 111/83 113/76 (!) 118/91 (!) 140/97  Pulse:   (!) 102 91  Resp: (!) 22 (!) 24 19 20   Temp:      TempSrc:      SpO2:  100% 100% 100%  Weight:      Height:        Constitutional: NAD, calm, comfortable Vitals:   02/13/18 0530 02/13/18 0600 02/13/18 0630 02/13/18 1021  BP: 111/83 113/76 (!) 118/91 (!) 140/97  Pulse:   (!) 102 91  Resp: (!) 22 (!) 24 19 20   Temp:      TempSrc:      SpO2:  100% 100% 100%  Weight:      Height:       Eyes: PERRL, lids and conjunctivae normal ENMT: Mucous membranes are moist. Posterior pharynx clear of any exudate or lesions.Normal dentition.  Neck: normal, supple, no masses, no thyromegaly Respiratory: clear to auscultation bilaterally, no wheezing, no crackles. Normal respiratory effort. No accessory muscle use.  Cardiovascular: Regular rate and rhythm, no murmurs / rubs / gallops. No extremity edema. 2+ pedal pulses. No carotid bruits.  Abdomen: no tenderness, no masses palpated. No hepatosplenomegaly. Bowel sounds positive.  Musculoskeletal: no clubbing / cyanosis. No joint deformity upper and lower extremities. Good ROM, no contractures. Normal muscle tone.  Skin: no rashes, lesions, ulcers.  No induration Neurologic: CN 2-12 grossly intact. Sensation intact, DTR normal. Strength 5/5 in all 4.  Psychiatric: Normal judgment and insight. Alert and oriented x 3. Normal mood.    Labs on Admission: I have personally reviewed following labs and imaging studies  CBC: Recent Labs  Lab 02/13/18 0129  WBC 5.2  NEUTROABS 2.7  HGB 15.4*  HCT 46.5*  MCV 91.2  PLT 893   Basic Metabolic Panel: Recent Labs  Lab 02/13/18 0129  NA 144  K 3.8  CL 104  CO2 28  GLUCOSE 82  BUN 9  CREATININE 0.43*  CALCIUM 8.8*   GFR: Estimated Creatinine Clearance: 50.6 mL/min (A) (by C-G formula based on SCr of 0.43 mg/dL (L)). Liver Function Tests: Recent Labs  Lab 02/13/18 0129  AST 43*  ALT 23  ALKPHOS 53  BILITOT 0.4  PROT 7.2  ALBUMIN 4.2   No results for input(s): LIPASE, AMYLASE in the last 168 hours. Recent Labs  Lab 02/13/18 0129  AMMONIA 36*   Coagulation Profile: No results for input(s): INR, PROTIME in the last 168 hours. Cardiac Enzymes: Recent Labs  Lab 02/13/18 0129  CKTOTAL 22  TROPONINI <0.03   BNP (last 3 results) No results for input(s): PROBNP in the last 8760 hours. HbA1C: No results for input(s): HGBA1C in the last 72 hours. CBG: No results for input(s): GLUCAP in the last 168 hours. Lipid Profile: No results for input(s): CHOL, HDL, LDLCALC, TRIG, CHOLHDL, LDLDIRECT in the last 72 hours. Thyroid Function Tests: No results for input(s): TSH, T4TOTAL, FREET4, T3FREE, THYROIDAB in the last 72 hours. Anemia Panel: No results for input(s): VITAMINB12, FOLATE, FERRITIN, TIBC, IRON, RETICCTPCT in the last 72 hours. Urine analysis:    Component Value Date/Time   COLORURINE YELLOW 02/13/2018 0100   APPEARANCEUR CLEAR 02/13/2018 0100   APPEARANCEUR Clear 10/13/2017 0940   LABSPEC 1.014 02/13/2018 0100   PHURINE 7.0 02/13/2018 0100   GLUCOSEU NEGATIVE 02/13/2018 0100   HGBUR NEGATIVE 02/13/2018 0100   BILIRUBINUR NEGATIVE 02/13/2018 0100    BILIRUBINUR Negative 10/13/2017 0940   KETONESUR NEGATIVE 02/13/2018 0100   PROTEINUR NEGATIVE 02/13/2018 0100   UROBILINOGEN negative 06/21/2013 1818   UROBILINOGEN 1.0 11/12/2011 1936   NITRITE NEGATIVE 02/13/2018 0100   LEUKOCYTESUR NEGATIVE 02/13/2018 0100   LEUKOCYTESUR Negative 10/13/2017 0940    Radiological Exams on Admission: No results found.  EKG: Independently reviewed. Sinus rhythm with non specific T wave inversions in V3  Assessment/Plan Active Problems:   Essential hypertension, benign   Depression   GAD (generalized anxiety disorder)   Alcohol use disorder, severe, dependence (HCC)   Lactic acidosis     1. Lactic acidosis. Unclear etiology. ?related to ETOH. She does not appear septic or toxic. Will continue with hydration and recheck levels 2. HTN. Blood pressure stable. Continue on coreg 3. Alcohol abuse. Will start on CIWA protocol 4. Depression. She follows with a psychiatrist, Dr. Toy Care. She was last admitted to [facility] in 3/19 for depression and SI. She has been feeling more depressed lately and was binge drinking. Will get TTS evaluation 5. Anxiety. Continue on home dose of ativan and atarax 6. Diarrhea. Will check GI pathogen panel  DVT prophylaxis:  lovenox Code Status: full code  Family Communication: no family present  Disposition Plan: discharge home once improved  Consults called: TTS  Admission status: observation, telemetry   Kathie Dike MD [facility] Pager [phone]  If 7PM-7AM, please contact night-coverage [email or web address] Password [email or web address]  02/13/2018, 10:51 AM

## 2018-02-13 NOTE — ED Notes (Signed)
Critical result. Lactic Acid 3.0. Dr. Tomi Bamberger notified.

## 2018-02-13 NOTE — ED Notes (Signed)
Have given pt a coke

## 2018-02-13 NOTE — Progress Notes (Signed)
Heart rate sustaining in 140's .  Dr. Roderic Palau ordered one time ativan 2mg  IV and coreg.  Last lactic 2.6  And Dr. Roderic Palau texted

## 2018-02-13 NOTE — BH Assessment (Addendum)
Tele Assessment Note   Patient Name: [name] MRN: [id number] Referring Physician: Pearletha Forge Location of Patient: [facility] Location of Provider: [place] is a 57 y.o. female, in hospital due to altered mental status. Pt arrived with a BAL of 309. Pt has a hx of alcohol abuse. She doesn't abuse alcohol anymore, but drinks small amounts daily to avoid tremors. Last night, pt reports drinking more than usual. Pt denies trying to kill herself. Pt denies SI, HI, AVH. Pt sees a psychiatrist for med mgmt and reports being med compliant.  No mental health concerns or issues noted or voiced.   Case staffed with Earleen Newport, NP, who also assessed pt. Pt does not meet criteria for mental health crisis stabilization and is psychiatrically cleared. Pt is recommended to follow up with her psychiatrist for a therapy referral. Pt's RN, Izora Gala, notified of disposition.   Diagnosis: MDD, recurrent, moderate  Past Medical History:  Past Medical History:  Diagnosis Date  . Alcohol dependence with alcohol-induced mood disorder ([place]) 07/2016  . Chest pain   . Depression   . GERD (gastroesophageal reflux disease)   . HTN (hypertension)    resolved after sleeve  . Seizures ([place])    patient denies    Past Surgical History:  Procedure Laterality Date  . ABDOMINAL HYSTERECTOMY    . BACK SURGERY    . FOOT SURGERY Right 9/10   right foot-bunion-hammertoe  . Lapidus fusion Left 08/02/12  . METATARSAL OSTEOTOMY WITH BUNIONECTOMY Left 08/02/12   McBride  . sleeve gastrectomy  03/2014   [facility]. Starting weight 192  . TUBAL LIGATION    . Tubes in ears      Family History:  Family History  Problem Relation Age of Onset  . Hypertension Mother   . Cancer Mother        kidney  . Diabetes Father   . Hypertension Father   . Colon cancer Neg Hx   . Colon polyps Neg Hx     Social History:  reports that she has never smoked. She has never used  smokeless tobacco. She reports that she drinks about 1.8 oz of alcohol per week. She reports that she does not use drugs.  Additional Social History:  Alcohol / Drug Use Pain Medications: See MAR Prescriptions: See MAR Over the Counter: See MAR History of alcohol / drug use?: Yes(History of alcohol abuse several years ago)  CIWA: CIWA-Ar BP: (!) 135/95 Pulse Rate: (!) 112 Nausea and Vomiting: no nausea and no vomiting Tactile Disturbances: none Tremor: five Auditory Disturbances: not present Paroxysmal Sweats: no sweat visible Visual Disturbances: not present Anxiety: six Headache, Fullness in Head: none present Agitation: somewhat more than normal activity Orientation and Clouding of Sensorium: oriented and can do serial additions CIWA-Ar Total: 12 COWS:    Allergies:  Allergies  Allergen Reactions  . Amlodipine Swelling    Ankle swelling   . Coreg [Carvedilol] Diarrhea  . Cortisporin [Neomycin-Polymyxin-Hc] Itching and Rash    Home Medications:  Medications Prior to Admission  Medication Sig Dispense Refill  . clonazePAM (KLONOPIN) 1 MG tablet Take 1 mg by mouth 3 (three) times daily as needed for anxiety.    Marland Kitchen acamprosate (CAMPRAL) 333 MG tablet Take 2 tablets (666 mg total) by mouth 3 (three) times daily with meals. For alcoholism 90 tablet 0  . escitalopram (LEXAPRO) 20 MG tablet Take 1 tablet (20 mg total) by mouth daily. For  depression 30 tablet 0  . lamoTRIgine (LAMICTAL) 25 MG tablet Take 2 tablets (50 mg total) by mouth daily. For mood stabilization 60 tablet 0  . omeprazole (PRILOSEC) 20 MG capsule Take 1 capsule (20 mg total) by mouth daily. For acid reflux 1 capsule 0  . oxyCODONE-acetaminophen (PERCOCET) 10-325 MG tablet Take 1 tablet by mouth every 4 (four) hours as needed.  0  . traZODone (DESYREL) 50 MG tablet Take 1 tablet (50 mg total) by mouth at bedtime as needed for sleep. 30 tablet 0    OB/GYN Status:  Patient's last menstrual period was  08/09/2013.  General Assessment Data Living Arrangements: Alone Admission Status: Voluntary Is patient capable of signing voluntary admission?: Yes Referral Source: MD     Crisis Care Plan Living Arrangements: Alone Name of Psychiatrist: Dr. Toy Care Name of Therapist: none  Education Status Is patient currently in school?: No Is the patient employed, unemployed or receiving disability?: Unemployed  Risk to self with the past 6 months Suicidal Ideation: No Has patient been a risk to self within the past 6 months prior to admission? : No Suicidal Intent: No Has patient had any suicidal intent within the past 6 months prior to admission? : No Is patient at risk for suicide?: No Suicidal Plan?: No Has patient had any suicidal plan within the past 6 months prior to admission? : No Access to Means: No Previous Attempts/Gestures: No Intentional Self Injurious Behavior: None Family Suicide History: Unknown Recent stressful life event(s): Loss (Comment) Persecutory voices/beliefs?: No Depression: Yes Substance abuse history and/or treatment for substance abuse?: No Suicide prevention information given to non-admitted patients: Not applicable  Risk to Others within the past 6 months Homicidal Ideation: No Does patient have any lifetime risk of violence toward others beyond the six months prior to admission? : No Thoughts of Harm to Others: No Current Homicidal Intent: No Current Homicidal Plan: No Access to Homicidal Means: No History of harm to others?: No Assessment of Violence: None Noted Does patient have access to weapons?: No Criminal Charges Pending?: Yes Describe Pending Criminal Charges: DWI Does patient have a court date: Yes Court Date: 03/13/18 Is patient on probation?: Unknown  Psychosis Hallucinations: None noted Delusions: None noted  Mental Status Report Appearance/Hygiene: Unremarkable Eye Contact: Fair Motor Activity: Unremarkable Speech:  Logical/coherent Level of Consciousness: Alert Mood: Pleasant, Euthymic Affect: Appropriate to circumstance Anxiety Level: Minimal Thought Processes: Coherent, Relevant Judgement: Unimpaired Orientation: Person, Time, Place, Situation Obsessive Compulsive Thoughts/Behaviors: None  Cognitive Functioning Concentration: Normal Memory: Recent Intact, Remote Intact Is patient IDD: No Is patient DD?: No Insight: Good Impulse Control: Good Appetite: Poor Have you had any weight changes? : No Change Sleep: No Change Vegetative Symptoms: None  ADLScreening [facility] Assessment Services) Patient's cognitive ability adequate to safely complete daily activities?: Yes Patient able to express need for assistance with ADLs?: Yes Independently performs ADLs?: Yes (appropriate for developmental age)  Prior Inpatient Therapy Prior Inpatient Therapy: Yes Prior Therapy Facilty/Provider(s): [facility] Reason for Treatment: depression; alcoholism  Prior Outpatient Therapy Prior Outpatient Therapy: No Does patient have an ACCT team?: No Does patient have Intensive In-House Services?  : No Does patient have [facility] services? : No Does patient have P4CC services?: No  ADL Screening (condition at time of admission) Patient's cognitive ability adequate to safely complete daily activities?: Yes Is the patient deaf or have difficulty hearing?: No Does the patient have difficulty seeing, even when wearing glasses/contacts?: No Does the patient have difficulty concentrating, remembering, or  making decisions?: No Patient able to express need for assistance with ADLs?: Yes Does the patient have difficulty dressing or bathing?: No Independently performs ADLs?: Yes (appropriate for developmental age) Does the patient have difficulty walking or climbing stairs?: No Weakness of Legs: None Weakness of Arms/Hands: None  Home Assistive Devices/Equipment Home Assistive Devices/Equipment: None  Therapy Consults  (therapy consults require a physician order) PT Evaluation Needed: No OT Evalulation Needed: No SLP Evaluation Needed: No Abuse/Neglect Assessment (Assessment to be complete while patient is alone) Abuse/Neglect Assessment Can Be Completed: Yes Physical Abuse: Denies Verbal Abuse: Denies Sexual Abuse: Denies Exploitation of patient/patient's resources: Denies Self-Neglect: Denies Values / Beliefs Cultural Requests During Hospitalization: None Spiritual Requests During Hospitalization: None Consults Spiritual Care Consult Needed: No Social Work Consult Needed: No Regulatory affairs officer (For Healthcare) Does Patient Have a Medical Advance Directive?: No Would patient like information on creating a medical advance directive?: No - Patient declined Nutrition Screen- [facility] Adult/[facility] Patient's home diet: Regular Has the patient recently lost weight without trying?: No Has the patient been eating poorly because of a decreased appetite?: No Malnutrition Screening Tool Score: 0        Disposition:  Disposition Initial Assessment Completed for this Encounter: Yes  This service was provided via telemedicine using a 2-way, interactive audio and video technology.  Names of all persons participating in this telemedicine service and their role in this encounter. Name: Earleen Newport Role: [facility] NP    Rexene Edison 02/13/2018 2:21 PM

## 2018-02-13 NOTE — Progress Notes (Signed)
Lab called with critical lactic 2.5 down from 3.4. Texted Dr. Roderic Palau

## 2018-02-13 NOTE — ED Notes (Signed)
Date and time results received: 02/13/18 7:21 AM (use smartphrase ".now" to insert current time)  Test: Lactic Acid Critical Value: 3.4  Name of Provider Notified: Tomi Bamberger  Orders Received? Or Actions Taken?: Orders Received - See Orders for details

## 2018-02-13 NOTE — ED Notes (Signed)
Date and time results received: 02/13/18 0332 (use smartphrase ".now" to insert current time)  Test: Lactic acid Critical Value: 3.1  Name of Provider Notified: Tomi Bamberger  Orders Received? Or Actions Taken?: Orders Received - See Orders for details
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
## 2018-02-13 NOTE — ED Notes (Addendum)
Critical result. Alcohol 309. Dr. Tomi Bamberger notified.

## 2018-02-14 DIAGNOSIS — F1020 Alcohol dependence, uncomplicated: Secondary | ICD-10-CM | POA: Diagnosis not present

## 2018-02-14 DIAGNOSIS — I10 Essential (primary) hypertension: Secondary | ICD-10-CM | POA: Diagnosis not present

## 2018-02-14 DIAGNOSIS — F10929 Alcohol use, unspecified with intoxication, unspecified: Secondary | ICD-10-CM | POA: Diagnosis not present

## 2018-02-14 DIAGNOSIS — F411 Generalized anxiety disorder: Secondary | ICD-10-CM | POA: Diagnosis not present

## 2018-02-14 LAB — COMPREHENSIVE METABOLIC PANEL
ALBUMIN: 3.2 g/dL — AB (ref 3.5–5.0)
ALT: 24 U/L (ref 0–44)
ANION GAP: 7 (ref 5–15)
AST: 39 U/L (ref 15–41)
Alkaline Phosphatase: 51 U/L (ref 38–126)
BILIRUBIN TOTAL: 0.7 mg/dL (ref 0.3–1.2)
BUN: 6 mg/dL (ref 6–20)
CHLORIDE: 104 mmol/L (ref 98–111)
CO2: 30 mmol/L (ref 22–32)
Calcium: 8.4 mg/dL — ABNORMAL LOW (ref 8.9–10.3)
Creatinine, Ser: 0.47 mg/dL (ref 0.44–1.00)
GFR calc Af Amer: >60 mL/min (ref >60)
GFR calc non Af Amer: >60 mL/min (ref >60)
GLUCOSE: 72 mg/dL (ref 70–99)
POTASSIUM: 3.1 mmol/L — AB (ref 3.5–5.1)
SODIUM: 141 mmol/L (ref 135–145)
TOTAL PROTEIN: 5.7 g/dL — AB (ref 6.5–8.1)

## 2018-02-14 LAB — CBC
HEMATOCRIT: 37 % (ref 36.0–46.0)
HEMOGLOBIN: 11.7 g/dL — AB (ref 12.0–15.0)
MCH: 29.2 pg (ref 26.0–34.0)
MCHC: 31.6 g/dL (ref 30.0–36.0)
MCV: 92.3 fL (ref 78.0–100.0)
Platelets: 174 10*3/uL (ref 150–400)
RBC: 4.01 MIL/uL (ref 3.87–5.11)
RDW: 14.5 % (ref 11.5–15.5)
WBC: 3.4 10*3/uL — ABNORMAL LOW (ref 4.0–10.5)

## 2018-02-14 LAB — GASTROINTESTINAL PANEL BY PCR, STOOL (REPLACES STOOL CULTURE)

## 2018-02-14 LAB — HIV ANTIBODY (ROUTINE TESTING W REFLEX): HIV SCREEN 4TH GENERATION: NONREACTIVE

## 2018-02-14 LAB — MAGNESIUM: MAGNESIUM: 1.8 mg/dL (ref 1.7–2.4)

## 2018-02-14 LAB — LACTIC ACID, PLASMA: Lactic Acid, Venous: 0.9 mmol/L (ref 0.5–1.9)

## 2018-02-14 MED ORDER — POTASSIUM CHLORIDE CRYS ER 20 MEQ PO TBCR
40.0000 meq | EXTENDED_RELEASE_TABLET | ORAL | Status: AC
  Administered 2018-02-14 (×2): 40 meq via ORAL
  Filled 2018-02-14 (×2): qty 2

## 2018-02-14 MED ORDER — CARVEDILOL 6.25 MG PO TABS: 6.2500 mg | ORAL_TABLET | Freq: Two times a day (BID) | ORAL | 0 refills | Status: DC

## 2018-02-14 MED ORDER — HYDROXYZINE HCL 25 MG PO TABS: ORAL_TABLET | ORAL | 0 refills | Status: DC

## 2018-02-14 NOTE — Progress Notes (Signed)
Patient discharged home today per MD orders. Patient vital signs WDL. IV removed and site WDL. Discharge Instructions including follow up appointments, medications, and education reviewed with patient. Patient verbalizes understanding. Patient is walked out with nursing staff. 

## 2018-02-14 NOTE — Discharge Summary (Signed)
Physician Discharge Summary  [name] ZOX:[id number] DOB: [birth year] DOA: 02/12/2018  PCP: Sharion Balloon, FNP  Admit date: 02/12/2018 Discharge date: 02/14/2018  Admitted From: home Disposition:  home  Recommendations for Outpatient Follow-up:  1. Follow up with PCP in 1-2 weeks 2. Please obtain BMP/CBC in one week  Discharge Condition:stable CODE STATUS:full code Diet recommendation: heart healthy  Brief/Interim Summary: 57 year old female admitted to the hospital with altered mental status.  She has a history of alcohol use, depression and anxiety.  Alcohol level was elevated on admission.  Toxicology screen was positive for benzodiazepines.  Lab work was relatively unrevealing except for an elevated lactic acid.  She did not have any evidence of infection or sepsis.  It is possible that her elevated lactic acid may be related to dehydration/alcohol.  She was hydrated with IV fluids with improvement of lactic acid levels to normal.  She does not have any evidence of alcohol withdrawal at this time.  We discussed alcohol rehab programs.  She spoke to Education officer, museum, but has decided to follow-up with Alcoholics Anonymous.  Regarding her depression, she is already followed by an outpatient psychiatrist.  Patient she was seen by psychiatry and was not felt to be a candidate for inpatient psychiatry for depression.  She was having some diarrhea prior to admission, but this has resolved but it has come to the hospital she has not had any further stools.  The patient is feeling significantly better after hydration and feels ready to discharge home.  Discharge Diagnoses:  Active Problems:   Essential hypertension, benign   Depression   GAD (generalized anxiety disorder)   Alcohol use disorder, severe, dependence (HCC)   Lactic acidosis    Discharge Instructions  Discharge Instructions    Diet - low sodium heart healthy   Complete by:  As directed    Increase activity slowly   Complete  by:  As directed      Allergies as of 02/14/2018      Reactions   Amlodipine Swelling   Ankle swelling   Coreg [carvedilol] Diarrhea   Cortisporin [neomycin-polymyxin-hc] Itching, Rash      Medication List    TAKE these medications   acamprosate 333 MG tablet Commonly known as:  CAMPRAL Take 2 tablets (666 mg total) by mouth 3 (three) times daily with meals. For alcoholism   carvedilol 6.25 MG tablet Commonly known as:  COREG Take 1 tablet (6.25 mg total) by mouth 2 (two) times daily with a meal. For high blood pressure   clonazePAM 1 MG tablet Commonly known as:  KLONOPIN Take 1 mg by mouth 3 (three) times daily as needed for anxiety.   escitalopram 20 MG tablet Commonly known as:  LEXAPRO Take 1 tablet (20 mg total) by mouth daily. For depression   hydrOXYzine 25 MG tablet Commonly known as:  ATARAX/VISTARIL Take 1 tablet (25 mg) by mouth Q 6 hours prn   lamoTRIgine 25 MG tablet Commonly known as:  LAMICTAL Take 2 tablets (50 mg total) by mouth daily. For mood stabilization   omeprazole 20 MG capsule Commonly known as:  PRILOSEC Take 1 capsule (20 mg total) by mouth daily. For acid reflux   oxyCODONE-acetaminophen 10-325 MG tablet Commonly known as:  PERCOCET Take 1 tablet by mouth every 4 (four) hours as needed.   traZODone 50 MG tablet Commonly known as:  DESYREL Take 1 tablet (50 mg total) by mouth at bedtime as needed for sleep.  Allergies  Allergen Reactions  . Amlodipine Swelling    Ankle swelling   . Coreg [Carvedilol] Diarrhea  . Cortisporin [Neomycin-Polymyxin-Hc] Itching and Rash    Consultations:  TTS   Procedures/Studies:  No results found.  Subjective: Feeling better, vomiting or diarrhea  Discharge Exam: Vitals:   02/14/18 0001 02/14/18 0600  BP: 127/88 (!) 142/87  Pulse: 63 69  Resp: 15 16  Temp: 98.8 F (37.1 C) 98.4 F (36.9 C)  SpO2: 100% 97%   Vitals:   02/13/18 1126 02/13/18 1810 02/14/18 0001 02/14/18  0600  BP:  125/85 127/88 (!) 142/87  Pulse:  100 63 69  Resp:  16 15 16   Temp:  98.9 F (37.2 C) 98.8 F (37.1 C) 98.4 F (36.9 C)  TempSrc:  Oral Oral Oral  SpO2:  100% 100% 97%  Weight: 40.8 kg (90 lb)     Height: 5' (1.524 m)       General: Pt is alert, awake, not in acute distress Cardiovascular: RRR, S1/S2 +, no rubs, no gallops Respiratory: CTA bilaterally, no wheezing, no rhonchi Abdominal: Soft, NT, ND, bowel sounds + Extremities: no edema, no cyanosis    The results of significant diagnostics from this hospitalization (including imaging, microbiology, ancillary and laboratory) are listed below for reference.     Microbiology: No results found for this or any previous visit (from the past 240 hour(s)).   Labs: BNP (last 3 results) No results for input(s): BNP in the last 8760 hours. Basic Metabolic Panel: Recent Labs  Lab 02/13/18 0129 02/13/18 1107 02/14/18 0421 02/14/18 0541  NA 144  --  141  --   K 3.8  --  3.1*  --   CL 104  --  104  --   CO2 28  --  30  --   GLUCOSE 82  --  72  --   BUN 9  --  6  --   CREATININE 0.43* 0.41* 0.47  --   CALCIUM 8.8*  --  8.4*  --   MG  --   --   --  1.8   Liver Function Tests: Recent Labs  Lab 02/13/18 0129 02/14/18 0421  AST 43* 39  ALT 23 24  ALKPHOS 53 51  BILITOT 0.4 0.7  PROT 7.2 5.7*  ALBUMIN 4.2 3.2*   No results for input(s): LIPASE, AMYLASE in the last 168 hours. Recent Labs  Lab 02/13/18 0129  AMMONIA 36*   CBC: Recent Labs  Lab 02/13/18 0129 02/14/18 0421  WBC 5.2 3.4*  NEUTROABS 2.7  --   HGB 15.4* 11.7*  HCT 46.5* 37.0  MCV 91.2 92.3  PLT 233 174   Cardiac Enzymes: Recent Labs  Lab 02/13/18 0129  CKTOTAL 7  TROPONINI <0.03   BNP: Invalid input(s): POCBNP CBG: No results for input(s): GLUCAP in the last 168 hours. D-Dimer No results for input(s): DDIMER in the last 72 hours. Hgb A1c No results for input(s): HGBA1C in the last 72 hours. Lipid Profile No results for  input(s): CHOL, HDL, LDLCALC, TRIG, CHOLHDL, LDLDIRECT in the last 72 hours. Thyroid function studies No results for input(s): TSH, T4TOTAL, T3FREE, THYROIDAB in the last 72 hours.  Invalid input(s): FREET3 Anemia work up No results for input(s): VITAMINB12, FOLATE, FERRITIN, TIBC, IRON, RETICCTPCT in the last 72 hours. Urinalysis    Component Value Date/Time   COLORURINE YELLOW 02/13/2018 0100   APPEARANCEUR CLEAR 02/13/2018 0100   APPEARANCEUR Clear 10/13/2017 0940   LABSPEC 1.014 02/13/2018  0100   PHURINE 7.0 02/13/2018 0100   GLUCOSEU NEGATIVE 02/13/2018 0100   HGBUR NEGATIVE 02/13/2018 0100   BILIRUBINUR NEGATIVE 02/13/2018 0100   BILIRUBINUR Negative 10/13/2017 0940   KETONESUR NEGATIVE 02/13/2018 0100   PROTEINUR NEGATIVE 02/13/2018 0100   UROBILINOGEN negative 06/21/2013 1818   UROBILINOGEN 1.0 11/12/2011 1936   NITRITE NEGATIVE 02/13/2018 0100   LEUKOCYTESUR NEGATIVE 02/13/2018 0100   LEUKOCYTESUR Negative 10/13/2017 0940   Sepsis Labs Invalid input(s): PROCALCITONIN,  WBC,  LACTICIDVEN Microbiology No results found for this or any previous visit (from the past 240 hour(s)).   Time coordinating discharge: 69mins  SIGNED:   Kathie Dike, MD  [facility] 02/14/2018, 11:22 AM Pager   If 7PM-7AM, please contact night-coverage [email or web address] Password [email or web address]

## 2018-02-14 NOTE — Clinical Social Work Note (Signed)
SA services were offered to patient. Patient stated that she will return to [place] meetings that are [facility]. She is not currently interested in SAIOP or Inpatient treatment because she has dogs.  Patient states that she feels that AA is enough and she has been successful with it in the past.     LCSW signing off.     Chyane Greer, Clydene Pugh, LCSW

## 2018-03-07 ENCOUNTER — Other Ambulatory Visit: Payer: Self-pay | Admitting: Family Medicine

## 2018-03-26 ENCOUNTER — Telehealth: Payer: BLUE CROSS/BLUE SHIELD | Admitting: Family

## 2018-03-26 DIAGNOSIS — N390 Urinary tract infection, site not specified: Secondary | ICD-10-CM

## 2018-03-26 DIAGNOSIS — A499 Bacterial infection, unspecified: Secondary | ICD-10-CM

## 2018-03-26 MED ORDER — PHENAZOPYRIDINE HCL 100 MG PO TABS: 100.0000 mg | ORAL_TABLET | Freq: Three times a day (TID) | ORAL | 0 refills | Status: DC | PRN

## 2018-03-26 MED ORDER — NITROFURANTOIN MONOHYD MACRO 100 MG PO CAPS: 100.0000 mg | ORAL_CAPSULE | Freq: Two times a day (BID) | ORAL | 0 refills | Status: DC

## 2018-03-26 NOTE — Progress Notes (Signed)
We are sorry that you are not feeling well.  Here is how we plan to help!  Based on what you shared with me it looks like you most likely have a simple urinary tract infection.  A UTI (Urinary Tract Infection) is a bacterial infection of the bladder.  Most cases of urinary tract infections are simple to treat but a key part of your care is to encourage you to drink plenty of fluids and watch your symptoms carefully.  I have prescribed MacroBid 100 mg twice a day for 5 days. Pyridium as well. Your symptoms should gradually improve. Call us if the burning in your urine worsens, you develop worsening fever, back pain or pelvic pain or if your symptoms do not resolve after completing the antibiotic.  Urinary tract infections can be prevented by drinking plenty of water to keep your body hydrated.  Also be sure when you wipe, wipe from front to back and don't hold it in!  If possible, empty your bladder every 4 hours.  Your e-visit answers were reviewed by a board certified advanced clinical practitioner to complete your personal care plan.  Depending on the condition, your plan could have included both over the counter or prescription medications.  If there is a problem please reply  once you have received a response from your provider.  Your safety is important to [place].  If you have drug allergies check your prescription carefully.    You can use MyChart to ask questions about today's visit, request a non-urgent call back, or ask for a work or school excuse for 24 hours related to this e-Visit. If it has been greater than 24 hours you will need to follow up with your provider, or enter a new e-Visit to address those concerns.   You will get an e-mail in the next two days asking about your experience.  I hope that your e-visit has been valuable and will speed your recovery. Thank you for using e-visits.

## 2018-03-28 ENCOUNTER — Encounter: Payer: Self-pay | Admitting: Family Medicine

## 2018-03-30 ENCOUNTER — Ambulatory Visit: Payer: BLUE CROSS/BLUE SHIELD | Admitting: Family Medicine

## 2018-04-05 DIAGNOSIS — G5603 Carpal tunnel syndrome, bilateral upper limbs: Secondary | ICD-10-CM | POA: Diagnosis not present

## 2018-04-05 DIAGNOSIS — M5412 Radiculopathy, cervical region: Secondary | ICD-10-CM | POA: Diagnosis not present

## 2018-04-05 DIAGNOSIS — M62830 Muscle spasm of back: Secondary | ICD-10-CM | POA: Diagnosis not present

## 2018-04-05 DIAGNOSIS — M546 Pain in thoracic spine: Secondary | ICD-10-CM | POA: Diagnosis not present

## 2018-04-05 DIAGNOSIS — G250 Essential tremor: Secondary | ICD-10-CM | POA: Diagnosis not present

## 2018-04-05 DIAGNOSIS — Z79899 Other long term (current) drug therapy: Secondary | ICD-10-CM | POA: Diagnosis not present

## 2018-04-06 DIAGNOSIS — R32 Unspecified urinary incontinence: Secondary | ICD-10-CM | POA: Diagnosis not present

## 2018-04-23 DIAGNOSIS — Z79899 Other long term (current) drug therapy: Secondary | ICD-10-CM | POA: Diagnosis not present

## 2018-05-28 DIAGNOSIS — R300 Dysuria: Secondary | ICD-10-CM | POA: Diagnosis not present

## 2018-07-12 DIAGNOSIS — M62830 Muscle spasm of back: Secondary | ICD-10-CM | POA: Diagnosis not present

## 2018-07-12 DIAGNOSIS — G5603 Carpal tunnel syndrome, bilateral upper limbs: Secondary | ICD-10-CM | POA: Diagnosis not present

## 2018-07-12 DIAGNOSIS — G250 Essential tremor: Secondary | ICD-10-CM | POA: Diagnosis not present

## 2018-07-12 DIAGNOSIS — M5412 Radiculopathy, cervical region: Secondary | ICD-10-CM | POA: Diagnosis not present

## 2018-07-17 ENCOUNTER — Ambulatory Visit: Payer: BLUE CROSS/BLUE SHIELD | Admitting: Family Medicine

## 2018-07-17 ENCOUNTER — Encounter: Payer: Self-pay | Admitting: Family Medicine

## 2018-07-17 VITALS — BP 163/109 | HR 100 | Temp 97.4°F | Ht 60.0 in | Wt 98.0 lb

## 2018-07-17 DIAGNOSIS — K219 Gastro-esophageal reflux disease without esophagitis: Secondary | ICD-10-CM | POA: Diagnosis not present

## 2018-07-17 DIAGNOSIS — M5137 Other intervertebral disc degeneration, lumbosacral region: Secondary | ICD-10-CM

## 2018-07-17 DIAGNOSIS — L231 Allergic contact dermatitis due to adhesives: Secondary | ICD-10-CM | POA: Diagnosis not present

## 2018-07-17 MED ORDER — OMEPRAZOLE 20 MG PO CPDR: 20.0000 mg | DELAYED_RELEASE_CAPSULE | Freq: Every day | ORAL | 1 refills | Status: DC

## 2018-07-17 NOTE — Progress Notes (Signed)
Subjective: CC: chronic back pain PCP: Dettinger, Carolyn Kaufmann, MD PPI:[name] is a 57 y.o. female presenting to clinic today for:  1. Chronic back pain/ rash Patient reports longstanding history of low back pain.  She notes that symptoms started about 8 years ago.  She has been seeing various specialists including a neurologist.  She has regular nerve conduction testing.  She was told in the past that she had history of bulging disc which was nonsurgical.  She is currently treated with opioid medications for low back pain.  She has had history of injections into the back as well.  She is interested in getting a second opinion with Dr. Merlene Carrillo.  She is here for referral today.  Of note, she denies any lower extremity numbness, tingling, weakness. No fecal incontinence, urinary retention or saddle anesthesia. No history of back surgeries.  She uses a TENS unit regularly but thinks that she may be having an allergic reaction to the stickers lately.  Rash is itchy.  Not exudative.  She is been applying lotion to the affected area.  2. GERD Patient reports good control of acid reflux symptoms with omeprazole 20 mg daily.  She has a history of gastric sleeve.  No nausea, vomiting, hematochezia, melena.   ROS: Per HPI  Allergies  Allergen Reactions  . Amlodipine Swelling    Ankle swelling   . Coreg [Carvedilol] Diarrhea  . Cortisporin [Neomycin-Polymyxin-Hc] Itching and Rash   Past Medical History:  Diagnosis Date  . Alcohol dependence with alcohol-induced mood disorder ([place]) 07/2016  . Chest pain   . Depression   . GERD (gastroesophageal reflux disease)   . HTN (hypertension)    resolved after sleeve  . Seizures ([place])    patient denies    Current Outpatient Medications:  .  clonazePAM (KLONOPIN) 1 MG tablet, Take 1 mg by mouth 3 (three) times daily as needed for anxiety., Disp: , Rfl:  .  omeprazole (PRILOSEC) 20 MG capsule, TAKE 1 CAPSULE BY MOUTH  DAILY, Disp: 30 capsule, Rfl:  0 .  oxyCODONE-acetaminophen (PERCOCET) 10-325 MG tablet, Take 1 tablet by mouth every 4 (four) hours as needed., Disp: , Rfl: 0 .  traZODone (DESYREL) 50 MG tablet, Take 1 tablet (50 mg total) by mouth at bedtime as needed for sleep., Disp: 30 tablet, Rfl: 0 Social History   Socioeconomic History  . Marital status: Married    Spouse name: Not on file  . Number of children: Not on file  . Years of education: Not on file  . Highest education level: Not on file  Occupational History  . Not on file  Social Needs  . Financial resource strain: Not on file  . Food insecurity:    Worry: Not on file    Inability: Not on file  . Transportation needs:    Medical: Not on file    Non-medical: Not on file  Tobacco Use  . Smoking status: Never Smoker  . Smokeless tobacco: Never Used  Substance and Sexual Activity  . Alcohol use: Yes    Alcohol/week: 3.0 standard drinks    Types: 3 Glasses of wine per week    Comment: daily  . Drug use: No  . Sexual activity: Not Currently    Birth control/protection: None  Lifestyle  . Physical activity:    Days per week: Not on file    Minutes per session: Not on file  . Stress: Not on file  Relationships  . Social connections:  Talks on phone: Not on file    Gets together: Not on file    Attends religious service: Not on file    Active member of club or organization: Not on file    Attends meetings of clubs or organizations: Not on file    Relationship status: Not on file  . Intimate partner violence:    Fear of current or ex partner: Not on file    Emotionally abused: Not on file    Physically abused: Not on file    Forced sexual activity: Not on file  Other Topics Concern  . Not on file  Social History Narrative  . Not on file   Family History  Problem Relation Age of Onset  . Hypertension Mother   . Cancer Mother        kidney  . Diabetes Father   . Hypertension Father   . Colon cancer Neg Hx   . Colon polyps Neg Hx      Objective: Office vital signs reviewed. BP (!) 163/109   Pulse 100   Temp (!) 97.4 F (36.3 C) (Oral)   Ht 5' (1.524 m)   Wt 98 lb (44.5 kg)   LMP 08/09/2013   BMI 19.14 kg/m   Physical Examination:  General: Awake, alert, No acute distress HEENT: Normal. Sclera white Extremities: warm, well perfused, No edema, cyanosis or clubbing; +2 pulses bilaterally MSK: normal gait and station  Lumbar spine: No midline tenderness to palpation.  She does have paraspinal muscle tenderness palpation, particularly over the lumbosacral junction.  No palpable bony abnormalities. Skin: dry; maculopapular rash noted along the lower thoracic/upper lumbar spine.  There is associated excoriation.  No exudate or purulence noted. Neuro: LE light touch sensation grossly in tact  Assessment/ Plan: 57 y.o. female   1. DDD (degenerative disc disease), lumbosacral Referral placed to Dr. Merlene Carrillo as per her request. - Ambulatory referral to Neurology  2. Gastroesophageal reflux disease, esophagitis presence not specified Omeprazole refilled.  Symptoms are controlled.  3. Allergic contact dermatitis due to adhesives Recommended continue moisturization.  Okay to use topical hydrocortisone cream if needed for itching and inflammation.   Orders Placed This Encounter  Procedures  . Ambulatory referral to Neurology    Referral Priority:   Routine    Referral Type:   Consultation    Referral Reason:   Specialty Services Required    Requested Specialty:   Neurology    Number of Visits Requested:   1   Meds ordered this encounter  Medications  . omeprazole (PRILOSEC) 20 MG capsule    Sig: Take 1 capsule (20 mg total) by mouth daily.    Dispense:  90 capsule    Refill:  1    Needs to be seen before next refill     [name], [place] [phone]

## 2018-07-17 NOTE — Patient Instructions (Signed)
Call if you have not heard from Dr Freddie Apley office in the next week.

## 2018-07-20 DIAGNOSIS — F410 Panic disorder [episodic paroxysmal anxiety] without agoraphobia: Secondary | ICD-10-CM | POA: Diagnosis not present

## 2018-07-20 DIAGNOSIS — F3342 Major depressive disorder, recurrent, in full remission: Secondary | ICD-10-CM | POA: Diagnosis not present

## 2018-08-17 ENCOUNTER — Ambulatory Visit: Payer: BLUE CROSS/BLUE SHIELD | Admitting: Physician Assistant

## 2018-08-17 ENCOUNTER — Encounter: Payer: Self-pay | Admitting: Physician Assistant

## 2018-08-17 VITALS — BP 134/86 | HR 119 | Temp 97.1°F | Ht 61.0 in | Wt 97.2 lb

## 2018-08-17 DIAGNOSIS — D1723 Benign lipomatous neoplasm of skin and subcutaneous tissue of right leg: Secondary | ICD-10-CM | POA: Diagnosis not present

## 2018-08-17 NOTE — Progress Notes (Signed)
BP 134/86   Pulse (!) 119   Temp (!) 97.1 F (36.2 C) (Oral)   Ht 5\' 1"  (1.549 m)   Wt 97 lb 3.2 oz (44.1 kg)   LMP 08/09/2013   BMI 18.37 kg/m    Subjective:    Patient ID: [name], female    DOB: 26-Oct-1960, 58 y.o.   MRN: [id number]  HPI: [name] is a 57 y.o. female presenting on 08/17/2018 for Cyst (Patient states she has a few little bumbs on her right hip that have been there several months. States their is no pain and they move around.)  Patient comes in with about 3 small areas on the right lateral hip that she was concerned about.  She has lost a lot of water weight after having gastric bypass surgery.  She does not remember injuring it.  She states these areas do not get red or swollen.  They are not tender.  She was just concerned about their status.  Past Medical History:  Diagnosis Date  . Alcohol dependence with alcohol-induced mood disorder ([place]) 07/2016  . Chest pain   . Depression   . GERD (gastroesophageal reflux disease)   . HTN (hypertension)    resolved after sleeve  . Seizures ([place])    patient denies   Relevant past medical, surgical, family and social history reviewed and updated as indicated. Interim medical history since our last visit reviewed. Allergies and medications reviewed and updated. DATA REVIEWED: CHART IN EPIC  Family History reviewed for pertinent findings.  Review of Systems  Constitutional: Negative.   HENT: Negative.   Eyes: Negative.   Respiratory: Negative.   Gastrointestinal: Negative.   Genitourinary: Negative.     Allergies as of 08/17/2018      Reactions   Amlodipine Swelling   Ankle swelling   Coreg [carvedilol] Diarrhea   Cortisporin [neomycin-polymyxin-hc] Itching, Rash      Medication List       Accurate as of August 17, 2018 10:36 AM. Always use your most recent med list.        clonazePAM 1 MG tablet Commonly known as:  KLONOPIN Take 1 mg by mouth 3 (three) times daily as needed for  anxiety.   omeprazole 20 MG capsule Commonly known as:  PRILOSEC Take 1 capsule (20 mg total) by mouth daily.   oxyCODONE-acetaminophen 10-325 MG tablet Commonly known as:  PERCOCET Take 1 tablet by mouth every 4 (four) hours as needed.   traZODone 50 MG tablet Commonly known as:  DESYREL Take 1 tablet (50 mg total) by mouth at bedtime as needed for sleep.          Objective:    BP 134/86   Pulse (!) 119   Temp (!) 97.1 F (36.2 C) (Oral)   Ht 5\' 1"  (1.549 m)   Wt 97 lb 3.2 oz (44.1 kg)   LMP 08/09/2013   BMI 18.37 kg/m   Allergies  Allergen Reactions  . Amlodipine Swelling    Ankle swelling   . Coreg [Carvedilol] Diarrhea  . Cortisporin [Neomycin-Polymyxin-Hc] Itching and Rash    Wt Readings from Last 3 Encounters:  08/17/18 97 lb 3.2 oz (44.1 kg)  07/17/18 98 lb (44.5 kg)  02/13/18 90 lb (40.8 kg)    Physical Exam Constitutional:      Appearance: She is well-developed.  HENT:     Head: Normocephalic and atraumatic.  Eyes:     Conjunctiva/sclera: Conjunctivae normal.  Pupils: Pupils are equal, round, and reactive to light.  Cardiovascular:     Rate and Rhythm: Normal rate and regular rhythm.     Heart sounds: Normal heart sounds.  Pulmonary:     Effort: Pulmonary effort is normal.     Breath sounds: Normal breath sounds.  Abdominal:     General: Bowel sounds are normal.     Palpations: Abdomen is soft.  Skin:    General: Skin is warm and dry.     Findings: No rash.     Comments: Right lateral hip with 3 small subcutaneous lipomas.  They are freely movable, they are nontender.  There is no redness or drainage.  Neurological:     Mental Status: She is alert and oriented to person, place, and time.     Deep Tendon Reflexes: Reflexes are normal and symmetric.  Psychiatric:        Behavior: Behavior normal.        Thought Content: Thought content normal.        Judgment: Judgment normal.       Assessment & Plan:   1. Lipoma of right lower  extremity Reassure and return if worsens or changes   Continue all other maintenance medications as listed above.  Follow up plan: No follow-ups on file.  Educational handout given for lipoma  Terald Sleeper PA-C [facility] [address]  [place], [place] [address] [phone]   08/17/2018, 10:36 AM

## 2018-08-17 NOTE — Patient Instructions (Signed)
Lipoma    A lipoma is a noncancerous (benign) tumor that is made up of fat cells. This is a very common type of soft-tissue growth. Lipomas are usually found under the skin (subcutaneous). They may occur in any tissue of the body that contains fat. Common areas for lipomas to appear include the back, shoulders, buttocks, and thighs.   Lipomas grow slowly, and they are usually painless. Most lipomas do not cause problems and do not require treatment.  What are the causes?  The cause of this condition is not known.  What increases the risk?  You are more likely to develop this condition if:   You are 40-60 years old.   You have a family history of lipomas.  What are the signs or symptoms?  A lipoma usually appears as a small, round bump under the skin. In most cases, the lump will:   Feel soft or rubbery.   Not cause pain or other symptoms.  However, if a lipoma is located in an area where it pushes on nerves, it can become painful or cause other symptoms.  How is this diagnosed?  A lipoma can usually be diagnosed with a physical exam. You may also have tests to confirm the diagnosis and to rule out other conditions. Tests may include:   Imaging tests, such as a CT scan or MRI.   Removal of a tissue sample to be looked at under a microscope (biopsy).  How is this treated?  Treatment for this condition depends on the size of the lipoma and whether it is causing any symptoms.   For small lipomas that are not causing problems, no treatment is needed.   If a lipoma is bigger or it causes problems, surgery may be done to remove the lipoma. Lipomas can also be removed to improve appearance. Most often, the procedure is done after applying a medicine that numbs the area (local anesthetic).  Follow these instructions at home:   Watch your lipoma for any changes.   Keep all follow-up visits as told by your health care provider. This is important.  Contact a health care provider if:   Your lipoma becomes larger or  hard.   Your lipoma becomes painful, red, or increasingly swollen. These could be signs of infection or a more serious condition.  Get help right away if:   You develop tingling or numbness in an area near the lipoma. This could indicate that the lipoma is causing nerve damage.  Summary   A lipoma is a noncancerous tumor that is made up of fat cells.   Most lipomas do not cause problems and do not require treatment.   If a lipoma is bigger or it causes problems, surgery may be done to remove the lipoma.  This information is not intended to replace advice given to you by your health care provider. Make sure you discuss any questions you have with your health care provider.  Document Released: 07/15/2002 Document Revised: 07/11/2017 Document Reviewed: 07/11/2017  Elsevier Interactive Patient Education  2019 Elsevier Inc[employer]

## 2018-08-28 DIAGNOSIS — M549 Dorsalgia, unspecified: Secondary | ICD-10-CM | POA: Diagnosis not present

## 2018-08-28 DIAGNOSIS — F329 Major depressive disorder, single episode, unspecified: Secondary | ICD-10-CM | POA: Diagnosis not present

## 2018-08-28 DIAGNOSIS — R0789 Other chest pain: Secondary | ICD-10-CM | POA: Diagnosis not present

## 2018-08-28 DIAGNOSIS — F419 Anxiety disorder, unspecified: Secondary | ICD-10-CM | POA: Diagnosis not present

## 2018-08-28 DIAGNOSIS — R0602 Shortness of breath: Secondary | ICD-10-CM | POA: Diagnosis not present

## 2018-08-28 DIAGNOSIS — Z79899 Other long term (current) drug therapy: Secondary | ICD-10-CM | POA: Diagnosis not present

## 2018-08-28 DIAGNOSIS — K219 Gastro-esophageal reflux disease without esophagitis: Secondary | ICD-10-CM | POA: Diagnosis not present

## 2018-08-31 DIAGNOSIS — F331 Major depressive disorder, recurrent, moderate: Secondary | ICD-10-CM | POA: Diagnosis not present

## 2018-08-31 DIAGNOSIS — F419 Anxiety disorder, unspecified: Secondary | ICD-10-CM | POA: Diagnosis not present

## 2018-08-31 DIAGNOSIS — Z79891 Long term (current) use of opiate analgesic: Secondary | ICD-10-CM | POA: Diagnosis not present

## 2018-08-31 DIAGNOSIS — M545 Low back pain: Secondary | ICD-10-CM | POA: Diagnosis not present

## 2018-09-24 ENCOUNTER — Encounter: Payer: Self-pay | Admitting: Family

## 2018-09-24 ENCOUNTER — Telehealth: Payer: Self-pay | Admitting: *Deleted

## 2018-09-24 ENCOUNTER — Ambulatory Visit (INDEPENDENT_AMBULATORY_CARE_PROVIDER_SITE_OTHER): Payer: BLUE CROSS/BLUE SHIELD | Admitting: Family

## 2018-09-24 ENCOUNTER — Ambulatory Visit: Payer: BLUE CROSS/BLUE SHIELD | Admitting: Family

## 2018-09-24 VITALS — BP 120/73 | HR 99 | Temp 100.1°F | Ht 61.0 in | Wt 98.6 lb

## 2018-09-24 DIAGNOSIS — R350 Frequency of micturition: Secondary | ICD-10-CM | POA: Diagnosis not present

## 2018-09-24 DIAGNOSIS — R1030 Lower abdominal pain, unspecified: Secondary | ICD-10-CM | POA: Diagnosis not present

## 2018-09-24 DIAGNOSIS — Z202 Contact with and (suspected) exposure to infections with a predominantly sexual mode of transmission: Secondary | ICD-10-CM

## 2018-09-24 DIAGNOSIS — R509 Fever, unspecified: Secondary | ICD-10-CM

## 2018-09-24 LAB — URINALYSIS, ROUTINE W REFLEX MICROSCOPIC
Bilirubin, UA: NEGATIVE
GLUCOSE, UA: NEGATIVE
Nitrite, UA: NEGATIVE
RBC, UA: NEGATIVE
SPEC GRAV UA: 1.02 (ref 1.005–1.030)
Urobilinogen, Ur: 2 mg/dL — ABNORMAL HIGH (ref 0.2–1.0)
pH, UA: 6.5 (ref 5.0–7.5)

## 2018-09-24 LAB — CBC WITH DIFFERENTIAL/PLATELET
BASOS: 0 %
Basophils Absolute: 0 10*3/uL (ref 0.0–0.2)
EOS (ABSOLUTE): 0 10*3/uL (ref 0.0–0.4)
Eos: 0 %
HEMATOCRIT: 41.1 % (ref 34.0–46.6)
Hemoglobin: 14 g/dL (ref 11.1–15.9)
LYMPHS: 8 %
Lymphocytes Absolute: 1.1 10*3/uL (ref 0.7–3.1)
MCH: 30.5 pg (ref 26.6–33.0)
MCHC: 34.1 g/dL (ref 31.5–35.7)
MCV: 90 fL (ref 79–97)
Monocytes Absolute: 0.5 10*3/uL (ref 0.1–0.9)
Monocytes: 3 %
NEUTROS ABS: 13.2 10*3/uL — AB (ref 1.4–7.0)
Neutrophils: 89 %
Platelets: 231 10*3/uL (ref 150–450)
RBC: 4.59 x10E6/uL (ref 3.77–5.28)
RDW: 13.4 % (ref 11.7–15.4)
WBC: 14.8 10*3/uL — ABNORMAL HIGH (ref 3.4–10.8)

## 2018-09-24 LAB — MICROSCOPIC EXAMINATION: Renal Epithel, UA: NONE SEEN /hpf

## 2018-09-24 NOTE — Progress Notes (Signed)
Subjective:    Patient ID: [name], female    DOB: 03/22/1961, 58 y.o.   MRN: [id number]  No chief complaint on file.  Pt presents to the office today with complaints of Urinary frequency and lower abdominal pain. She states the frequency started 3 days ago, but the lower abdominal pain started last night. She states the pain is worse when she is walking or moving.   She is requesting to be tested for STI's today. States her husband passed away within the year and has had a new partner.  Urinary Tract Infection   This is a new problem. The current episode started in the past 7 days. The problem occurs intermittently. The problem has been gradually worsening. The patient is experiencing no pain. Associated symptoms include frequency, nausea and urgency. Pertinent negatives include no chills, discharge, flank pain, hematuria or vomiting. She has tried increased fluids for the symptoms. The treatment provided no relief.  Abdominal Pain  This is a new problem. The current episode started yesterday. The onset quality is sudden. The problem occurs constantly. The problem has been gradually worsening. The pain is located in the LLQ and RLQ. The pain is at a severity of 9/10. The pain is mild. Associated symptoms include frequency and nausea. Pertinent negatives include no belching, constipation, diarrhea, flatus, hematuria or vomiting. The pain is aggravated by movement. The pain is relieved by being still. Treatments tried: tums. The treatment provided mild relief.      Review of Systems  Constitutional: Negative for chills.  Gastrointestinal: Positive for abdominal pain and nausea. Negative for constipation, diarrhea, flatus and vomiting.  Genitourinary: Positive for frequency and urgency. Negative for flank pain and hematuria.  All other systems reviewed and are negative.      Objective:   Physical Exam Vitals signs reviewed.  Constitutional:      General: She is not in acute  distress.    Appearance: She is well-developed.  HENT:     Head: Normocephalic and atraumatic.     Right Ear: External ear normal.  Eyes:     Pupils: Pupils are equal, round, and reactive to light.  Neck:     Musculoskeletal: Normal range of motion and neck supple.     Thyroid: No thyromegaly.  Cardiovascular:     Rate and Rhythm: Normal rate and regular rhythm.     Heart sounds: Normal heart sounds. No murmur.  Pulmonary:     Effort: Pulmonary effort is normal. No respiratory distress.     Breath sounds: Normal breath sounds. No wheezing.  Abdominal:     General: There is no distension.     Palpations: Abdomen is soft. There is no mass.     Tenderness: There is abdominal tenderness.  Musculoskeletal: Normal range of motion.        General: No tenderness.  Skin:    General: Skin is warm and dry.  Neurological:     Mental Status: She is alert and oriented to person, place, and time.     Cranial Nerves: No cranial nerve deficit.     Deep Tendon Reflexes: Reflexes are normal and symmetric.  Psychiatric:        Behavior: Behavior normal.        Thought Content: Thought content normal.        Judgment: Judgment normal.       BP 120/73   Pulse 99   Temp 100.1 F (37.8 C) (Oral)   Ht _0  (1.549  m)   Wt 98 lb 9.6 oz (44.7 kg)   LMP 08/09/2013   BMI 18.63 kg/m      Assessment & Plan:  [name] comes in today with chief complaint of Urinary Tract Infection   Diagnosis and orders addressed:  1. Urinary frequency - Urinalysis, Routine w reflex microscopic - Urinalysis, Complete - CBC with Differential/Platelet - CMP14+EGFR  2. Lower abdominal pain - CBC with Differential/Platelet - CT Abdomen Pelvis W Contrast; Future - CMP14+EGFR  3. Fever, unspecified fever cause - CBC with Differential/Platelet - CMP14+EGFR  4. Possible exposure to STD - CMP14+EGFR - STD Screen (8) - Chlamydia/Gonococcus/Trichomonas, NAA  Given abdominal pain and fever will need  to get scan. Pt states she can not get done today because she has "too much going on". CBC stat, and if WBC is elevated she will go to ED CT scan ordered for tomorrow  IF pain worsens at all go to ED!!!  Evelina Dun, FNP

## 2018-09-24 NOTE — Telephone Encounter (Signed)
Patient aware of elevated white blood cells.  She was advised and agreed to go to hospital for assessment of her abdominal pain.

## 2018-09-24 NOTE — Patient Instructions (Signed)
Diverticulitis  Diverticulitis is infection or inflammation of small pouches (diverticula) in the colon that form due to a condition called diverticulosis. Diverticula can trap stool (feces) and bacteria, causing infection and inflammation. Diverticulitis may cause severe stomach pain and diarrhea. It may lead to tissue damage in the colon that causes bleeding. The diverticula may also burst (rupture) and cause infected stool to enter other areas of the abdomen. Complications of diverticulitis can include:  Bleeding.  Severe infection.  Severe pain.  Rupture (perforation) of the colon.  Blockage (obstruction) of the colon. What are the causes? This condition is caused by stool becoming trapped in the diverticula, which allows bacteria to grow in the diverticula. This leads to inflammation and infection. What increases the risk? You are more likely to develop this condition if:  You have diverticulosis. The risk for diverticulosis increases if: ? You are overweight or obese. ? You use tobacco products. ? You do not get enough exercise.  You eat a diet that does not include enough fiber. High-fiber foods include fruits, vegetables, beans, nuts, and whole grains. What are the signs or symptoms? Symptoms of this condition may include:  Pain and tenderness in the abdomen. The pain is normally located on the left side of the abdomen, but it may occur in other areas.  Fever and chills.  Bloating.  Cramping.  Nausea.  Vomiting.  Changes in bowel routines.  Blood in your stool. How is this diagnosed? This condition is diagnosed based on:  Your medical history.  A physical exam.  Tests to make sure there is nothing else causing your condition. These tests may include: ? Blood tests. ? Urine tests. ? Imaging tests of the abdomen, including X-rays, ultrasounds, MRIs, or CT scans. How is this treated? Most cases of this condition are mild and can be treated at home.  Treatment may include:  Taking over-the-counter pain medicines.  Following a clear liquid diet.  Taking antibiotic medicines by mouth.  Rest. More severe cases may need to be treated at a hospital. Treatment may include:  Not eating or drinking.  Taking prescription pain medicine.  Receiving antibiotic medicines through an IV tube.  Receiving fluids and nutrition through an IV tube.  Surgery. When your condition is under control, your health care provider may recommend that you have a colonoscopy. This is an exam to look at the entire large intestine. During the exam, a lubricated, bendable tube is inserted into the anus and then passed into the rectum, colon, and other parts of the large intestine. A colonoscopy can show how severe your diverticula are and whether something else may be causing your symptoms. Follow these instructions at home: Medicines  Take over-the-counter and prescription medicines only as told by your health care provider. These include fiber supplements, probiotics, and stool softeners.  If you were prescribed an antibiotic medicine, take it as told by your health care provider. Do not stop taking the antibiotic even if you start to feel better.  Do not drive or use heavy machinery while taking prescription pain medicine. General instructions   Follow a full liquid diet or another diet as directed by your health care provider. After your symptoms improve, your health care provider may tell you to change your diet. He or she may recommend that you eat a diet that contains at least 25 g (25 grams) of fiber daily. Fiber makes it easier to pass stool. Healthy sources of fiber include: ? Berries. One cup contains 4-8 grams of   fiber. ? Beans or lentils. One half cup contains 5-8 grams of fiber. ? Green vegetables. One cup contains 4 grams of fiber.  Exercise for at least 30 minutes, 3 times each week. You should exercise hard enough to raise your heart rate and  break a sweat.  Keep all follow-up visits as told by your health care provider. This is important. You may need a colonoscopy. Contact a health care provider if:  Your pain does not improve.  You have a hard time drinking or eating food.  Your bowel movements do not return to normal. Get help right away if:  Your pain gets worse.  Your symptoms do not get better with treatment.  Your symptoms suddenly get worse.  You have a fever.  You vomit more than one time.  You have stools that are bloody, black, or tarry. Summary  Diverticulitis is infection or inflammation of small pouches (diverticula) in the colon that form due to a condition called diverticulosis. Diverticula can trap stool (feces) and bacteria, causing infection and inflammation.  You are at higher risk for this condition if you have diverticulosis and you eat a diet that does not include enough fiber.  Most cases of this condition are mild and can be treated at home. More severe cases may need to be treated at a hospital.  When your condition is under control, your health care provider may recommend that you have an exam called a colonoscopy. This exam can show how severe your diverticula are and whether something else may be causing your symptoms. This information is not intended to replace advice given to you by your health care provider. Make sure you discuss any questions you have with your health care provider. Document Released: 05/04/2005 Document Revised: 08/27/2016 Document Reviewed: 08/27/2016 Elsevier Interactive Patient Education  2019 [employer] Inc[employer]  

## 2018-09-25 ENCOUNTER — Ambulatory Visit (HOSPITAL_COMMUNITY): Admission: RE | Admit: 2018-09-25 | Payer: BLUE CROSS/BLUE SHIELD | Source: Ambulatory Visit

## 2018-09-25 LAB — CMP14+EGFR
A/G RATIO: 1.9 (ref 1.2–2.2)
ALK PHOS: 65 IU/L (ref 39–117)
ALT: 12 IU/L (ref 0–32)
AST: 24 IU/L (ref 0–40)
Albumin: 4.4 g/dL (ref 3.8–4.9)
BILIRUBIN TOTAL: 0.7 mg/dL (ref 0.0–1.2)
BUN/Creatinine Ratio: 26 — ABNORMAL HIGH (ref 9–23)
BUN: 18 mg/dL (ref 6–24)
CHLORIDE: 98 mmol/L (ref 96–106)
CO2: 23 mmol/L (ref 20–29)
CREATININE: 0.69 mg/dL (ref 0.57–1.00)
Calcium: 8.9 mg/dL (ref 8.7–10.2)
GFR calc Af Amer: 112 mL/min/{1.73_m2} (ref >59)
GFR calc non Af Amer: 97 mL/min/{1.73_m2} (ref >59)
GLOBULIN, TOTAL: 2.3 g/dL (ref 1.5–4.5)
Glucose: 97 mg/dL (ref 65–99)
POTASSIUM: 3.8 mmol/L (ref 3.5–5.2)
Sodium: 138 mmol/L (ref 134–144)
Total Protein: 6.7 g/dL (ref 6.0–8.5)

## 2018-09-25 LAB — CHLAMYDIA/GONOCOCCUS/TRICHOMONAS, NAA
CHLAMYDIA BY NAA: NEGATIVE
Gonococcus by NAA: NEGATIVE
Trich vag by NAA: NEGATIVE

## 2018-09-25 LAB — STD SCREEN (8)
HIV SCREEN 4TH GENERATION: NONREACTIVE
Hep A IgM: NEGATIVE
Hep B C IgM: NEGATIVE
Hepatitis B Surface Ag: NEGATIVE
RPR: NONREACTIVE

## 2018-09-26 ENCOUNTER — Encounter: Payer: Self-pay | Admitting: Family Medicine

## 2018-09-26 ENCOUNTER — Ambulatory Visit (INDEPENDENT_AMBULATORY_CARE_PROVIDER_SITE_OTHER): Payer: BLUE CROSS/BLUE SHIELD | Admitting: Family Medicine

## 2018-09-26 VITALS — BP 136/92 | HR 105 | Temp 99.3°F | Ht 61.0 in | Wt 99.0 lb

## 2018-09-26 DIAGNOSIS — R1030 Lower abdominal pain, unspecified: Secondary | ICD-10-CM

## 2018-09-26 MED ORDER — OMEPRAZOLE 20 MG PO CPDR: 20.0000 mg | DELAYED_RELEASE_CAPSULE | Freq: Every day | ORAL | 1 refills | Status: DC

## 2018-09-26 MED ORDER — TRAZODONE HCL 50 MG PO TABS: 50.0000 mg | ORAL_TABLET | Freq: Every evening | ORAL | 1 refills | Status: DC | PRN

## 2018-09-26 NOTE — Progress Notes (Signed)
BP (!) 136/92   Pulse (!) 105   Temp 99.3 F (37.4 C) (Oral)   Ht 5' 1" (1.549 m)   Wt 99 lb (44.9 kg)   LMP 08/09/2013   BMI 18.71 kg/m    Subjective:    Patient ID: [name], female    DOB: 03/06/1961, 58 y.o.   MRN: [id number]  HPI: [name] is a 58 y.o. female presenting on 09/26/2018 for Pelvic Pain (re check - Patient states it has not gotten any better.)   HPI Lower abdominal pain Patient comes in for continued lower abdominal pain that she has had over the past week and it has not gotten any better.  She denies any dysuria or urinary frequency or vaginal discharge or irritation.  She has been sexually active and had a partner that she is now breaking up with that was unreliable and whether or not he was monogamous.  Patient complains that most of the pain is lower abdominal but does come up through the rest of her abdomen at times.  She rates the pain as moderate to severe and that it does hurt her when she is walking.  Patient denies any nausea or vomiting.  She thought she was constipated but then she took some medicine to help with and has had a good bowel movement over the weekend and that did not improve any of the symptoms.  She denies any blood in her stool.  She denies any diarrhea or fevers or chills but she does have a low-grade temperature of 99.3 here in the office.  Relevant past medical, surgical, family and social history reviewed and updated as indicated. Interim medical history since our last visit reviewed. Allergies and medications reviewed and updated.  Review of Systems  Constitutional: Negative for chills and fever.  Eyes: Negative for visual disturbance.  Respiratory: Negative for chest tightness and shortness of breath.   Cardiovascular: Negative for chest pain and leg swelling.  Gastrointestinal: Positive for abdominal pain. Negative for blood in stool, constipation, diarrhea, nausea, rectal pain and vomiting.  Genitourinary: Negative for  difficulty urinating, dysuria, frequency, hematuria, pelvic pain and urgency.  Musculoskeletal: Negative for back pain and gait problem.  Skin: Negative for rash.  Neurological: Negative for light-headedness and headaches.  Psychiatric/Behavioral: Negative for agitation and behavioral problems.  All other systems reviewed and are negative.   Per HPI unless specifically indicated above   Allergies as of 09/26/2018      Reactions   Amlodipine Swelling   Ankle swelling   Coreg [carvedilol] Diarrhea   Cortisporin [neomycin-polymyxin-hc] Itching, Rash      Medication List       Accurate as of September 26, 2018  3:25 PM. Always use your most recent med list.        BELBUCA BU 150 mg.   BUSPIRONE HCL PO   clonazePAM 1 MG tablet Commonly known as:  KLONOPIN Take 1 mg by mouth 3 (three) times daily as needed for anxiety.   CYCLOBENZAPRINE HCL PO 10 mg.   HYDROcodone-acetaminophen 10-325 MG tablet Commonly known as:  NORCO   omeprazole 20 MG capsule Commonly known as:  PRILOSEC Take 1 capsule (20 mg total) by mouth daily.   traZODone 50 MG tablet Commonly known as:  DESYREL Take 1 tablet (50 mg total) by mouth at bedtime as needed for sleep.          Objective:    BP (!) 136/92   Pulse (!) 105  Temp 99.3 F (37.4 C) (Oral)   Ht 5' 1" (1.549 m)   Wt 99 lb (44.9 kg)   LMP 08/09/2013   BMI 18.71 kg/m   Wt Readings from Last 3 Encounters:  09/26/18 99 lb (44.9 kg)  09/24/18 98 lb 9.6 oz (44.7 kg)  08/17/18 97 lb 3.2 oz (44.1 kg)    Physical Exam Vitals signs and nursing note reviewed.  Constitutional:      General: She is not in acute distress.    Appearance: She is well-developed. She is not diaphoretic.  Eyes:     Conjunctiva/sclera: Conjunctivae normal.  Cardiovascular:     Rate and Rhythm: Normal rate and regular rhythm.     Heart sounds: Normal heart sounds. No murmur.  Pulmonary:     Effort: Pulmonary effort is normal. No respiratory distress.       Breath sounds: Normal breath sounds. No wheezing.  Abdominal:     General: Abdomen is flat. Bowel sounds are normal. There is no distension.     Tenderness: There is abdominal tenderness. There is no guarding or rebound.  Skin:    General: Skin is warm and dry.     Findings: No rash.  Neurological:     Mental Status: She is alert and oriented to person, place, and time.     Coordination: Coordination normal.  Psychiatric:        Behavior: Behavior normal.     Results for orders placed or performed in visit on 09/24/18  Chlamydia/Gonococcus/Trichomonas, NAA  Result Value Ref Range   Chlamydia by NAA Negative Negative   Gonococcus by NAA Negative Negative   Trich vag by NAA Negative Negative  Microscopic Examination  Result Value Ref Range   WBC, UA 6-10 (A) 0 - 5 /hpf   RBC, UA 0-2 0 - 2 /hpf   Epithelial Cells (non renal) 0-10 0 - 10 /hpf   Renal Epithel, UA None seen None seen /hpf   Mucus, UA Present Not Estab.   Bacteria, UA Few (A) None seen/Few   Yeast, UA Present None seen  Urinalysis, Routine w reflex microscopic  Result Value Ref Range   Specific Gravity, UA 1.020 1.005 - 1.030   pH, UA 6.5 5.0 - 7.5   Color, UA Amber (A) Yellow   Appearance Ur Clear Clear   Leukocytes, UA Trace (A) Negative   Protein, UA Trace (A) Negative/Trace   Glucose, UA Negative Negative   Ketones, UA 2+ (A) Negative   RBC, UA Negative Negative   Bilirubin, UA Negative Negative   Urobilinogen, Ur 2.0 (H) 0.2 - 1.0 mg/dL   Nitrite, UA Negative Negative   Microscopic Examination See below:   CBC with Differential/Platelet  Result Value Ref Range   WBC 14.8 (H) 3.4 - 10.8 x10E3/uL   RBC 4.59 3.77 - 5.28 x10E6/uL   Hemoglobin 14.0 11.1 - 15.9 g/dL   Hematocrit 41.1 34.0 - 46.6 %   MCV 90 79 - 97 fL   MCH 30.5 26.6 - 33.0 pg   MCHC 34.1 31.5 - 35.7 g/dL   RDW 13.4 11.7 - 15.4 %   Platelets 231 150 - 450 x10E3/uL   Neutrophils 89 Not Estab. %   Lymphs 8 Not Estab. %   Monocytes  3 Not Estab. %   Eos 0 Not Estab. %   Basos 0 Not Estab. %   Neutrophils Absolute 13.2 (H) 1.4 - 7.0 x10E3/uL   Lymphocytes Absolute 1.1 0.7 - 3.1 x10E3/uL  Monocytes Absolute 0.5 0.1 - 0.9 x10E3/uL   EOS (ABSOLUTE) 0.0 0.0 - 0.4 x10E3/uL   Basophils Absolute 0.0 0.0 - 0.2 x10E3/uL  CMP14+EGFR  Result Value Ref Range   Glucose 97 65 - 99 mg/dL   BUN 18 6 - 24 mg/dL   Creatinine, Ser 0.69 0.57 - 1.00 mg/dL   GFR calc non Af Amer 97 >59 mL/min/1.73   GFR calc Af Amer 112 >59 mL/min/1.73   BUN/Creatinine Ratio 26 (H) 9 - 23   Sodium 138 134 - 144 mmol/L   Potassium 3.8 3.5 - 5.2 mmol/L   Chloride 98 96 - 106 mmol/L   CO2 23 20 - 29 mmol/L   Calcium 8.9 8.7 - 10.2 mg/dL   Total Protein 6.7 6.0 - 8.5 g/dL   Albumin 4.4 3.8 - 4.9 g/dL   Globulin, Total 2.3 1.5 - 4.5 g/dL   Albumin/Globulin Ratio 1.9 1.2 - 2.2   Bilirubin Total 0.7 0.0 - 1.2 mg/dL   Alkaline Phosphatase 65 39 - 117 IU/L   AST 24 0 - 40 IU/L   ALT 12 0 - 32 IU/L  STD Screen (8)  Result Value Ref Range   Hep A IgM Negative Negative   Hepatitis B Surface Ag Negative Negative   Hep B C IgM Negative Negative   Hep C Virus Ab <0.1 0.0 - 0.9 s/co ratio   RPR Ser Ql Non Reactive Non Reactive   HIV Screen 4th Generation wRfx Non Reactive Non Reactive   HSV 1 Glycoprotein G Ab, IgG <0.91 0.00 - 0.90 index   HSV 2 IgG, Type Spec <0.91 0.00 - 0.90 index      Assessment & Plan:   Problem List Items Addressed This Visit      Other   Abdominal pain - Primary   Relevant Orders   Chlamydia/Gonococcus/Trichomonas, NAA      STD panel came back negative and patient still has lower abdominal pain and vaginal exam was insignificant and patient has had a previous hysterectomy and has minimal vaginal discharge or irritation.  Patient was previously scheduled for a CT scan but has not had it done, recommend for her to go for the CT scan done patient wants to wait till Friday to get it done and I highly encouraged her to get it  done sooner including today or tomorrow but she declined because she does not want to miss work and will do it Friday. Follow up plan: Return if symptoms worsen or fail to improve.  Counseling provided for all of the vaccine components Orders Placed This Encounter  Procedures  . Chlamydia/Gonococcus/Trichomonas, NAA    Caryl Pina, MD [facility] 09/26/2018, 3:25 PM

## 2018-09-28 ENCOUNTER — Telehealth: Payer: Self-pay | Admitting: Family Medicine

## 2018-09-28 ENCOUNTER — Other Ambulatory Visit: Payer: Self-pay | Admitting: Family

## 2018-09-28 ENCOUNTER — Other Ambulatory Visit (HOSPITAL_COMMUNITY): Payer: Self-pay | Admitting: Neurology

## 2018-09-28 ENCOUNTER — Ambulatory Visit (HOSPITAL_COMMUNITY)
Admission: RE | Admit: 2018-09-28 | Discharge: 2018-09-28 | Disposition: A | Discharge status: Home / Self Care | Payer: BLUE CROSS/BLUE SHIELD | Source: Ambulatory Visit | Attending: Family | Admitting: Family

## 2018-09-28 ENCOUNTER — Ambulatory Visit (HOSPITAL_COMMUNITY)
Admission: RE | Admit: 2018-09-28 | Discharge: 2018-09-28 | Disposition: A | Discharge status: Home / Self Care | Payer: BLUE CROSS/BLUE SHIELD | Source: Ambulatory Visit | Attending: Neurology | Admitting: Neurology

## 2018-09-28 DIAGNOSIS — M545 Low back pain, unspecified: Secondary | ICD-10-CM

## 2018-09-28 DIAGNOSIS — R1030 Lower abdominal pain, unspecified: Secondary | ICD-10-CM | POA: Insufficient documentation

## 2018-09-28 DIAGNOSIS — K5730 Diverticulosis of large intestine without perforation or abscess without bleeding: Secondary | ICD-10-CM | POA: Diagnosis not present

## 2018-09-28 MED ORDER — METRONIDAZOLE 500 MG PO TABS: 500.0000 mg | ORAL_TABLET | Freq: Three times a day (TID) | ORAL | 0 refills | Status: DC

## 2018-09-28 MED ORDER — CIPROFLOXACIN HCL 500 MG PO TABS: 500.0000 mg | ORAL_TABLET | Freq: Two times a day (BID) | ORAL | 0 refills | Status: DC

## 2018-09-28 MED ORDER — IOHEXOL 300 MG/ML  SOLN
100.0000 mL | Freq: Once | INTRAMUSCULAR | Status: AC | PRN
  Administered 2018-09-28: 80 mL via INTRAVENOUS

## 2018-09-29 LAB — CHLAMYDIA/GONOCOCCUS/TRICHOMONAS, NAA
Chlamydia by NAA: NEGATIVE
Gonococcus by NAA: NEGATIVE
TRICH VAG BY NAA: NEGATIVE

## 2018-10-01 NOTE — Telephone Encounter (Signed)
Aware of all results and new medicine.

## 2018-10-09 DIAGNOSIS — Z79891 Long term (current) use of opiate analgesic: Secondary | ICD-10-CM | POA: Diagnosis not present

## 2018-10-09 DIAGNOSIS — F331 Major depressive disorder, recurrent, moderate: Secondary | ICD-10-CM | POA: Diagnosis not present

## 2018-10-09 DIAGNOSIS — F419 Anxiety disorder, unspecified: Secondary | ICD-10-CM | POA: Diagnosis not present

## 2018-10-09 DIAGNOSIS — M545 Low back pain: Secondary | ICD-10-CM | POA: Diagnosis not present

## 2018-10-19 ENCOUNTER — Other Ambulatory Visit: Payer: Self-pay

## 2018-10-19 ENCOUNTER — Ambulatory Visit: Payer: BLUE CROSS/BLUE SHIELD | Admitting: Family Medicine

## 2018-10-19 ENCOUNTER — Encounter: Payer: Self-pay | Admitting: Family Medicine

## 2018-10-19 VITALS — BP 143/97 | HR 99 | Temp 97.6°F | Ht 61.0 in | Wt 99.2 lb

## 2018-10-19 DIAGNOSIS — L249 Irritant contact dermatitis, unspecified cause: Secondary | ICD-10-CM | POA: Diagnosis not present

## 2018-10-19 DIAGNOSIS — L209 Atopic dermatitis, unspecified: Secondary | ICD-10-CM

## 2018-10-19 DIAGNOSIS — L0100 Impetigo, unspecified: Secondary | ICD-10-CM | POA: Diagnosis not present

## 2018-10-19 DIAGNOSIS — T7421XA Adult sexual abuse, confirmed, initial encounter: Secondary | ICD-10-CM
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
## 2018-10-20 LAB — STD SCREEN (8)
HEP B S AG: NEGATIVE
HIV Screen 4th Generation wRfx: NONREACTIVE
HSV 1 Glycoprotein G Ab, IgG: <0.91 index (ref 0.00–0.90)
HSV 2 IgG, Type Spec: <0.91 index (ref 0.00–0.90)
Hep A IgM: NEGATIVE
Hep B C IgM: NEGATIVE
Hep C Virus Ab: <0.1 s/co ratio (ref 0.0–0.9)
RPR Ser Ql: NONREACTIVE

## 2018-10-23 ENCOUNTER — Telehealth: Payer: Self-pay | Admitting: Family Medicine

## 2018-10-23 LAB — CHLAMYDIA/GONOCOCCUS/TRICHOMONAS, NAA
Chlamydia by NAA: NEGATIVE
Gonococcus by NAA: NEGATIVE
Trich vag by NAA: NEGATIVE

## 2018-10-23 NOTE — Telephone Encounter (Signed)
I have sent results to the pool

## 2018-10-29 ENCOUNTER — Other Ambulatory Visit: Payer: Self-pay

## 2018-10-29 ENCOUNTER — Ambulatory Visit (INDEPENDENT_AMBULATORY_CARE_PROVIDER_SITE_OTHER): Payer: BLUE CROSS/BLUE SHIELD | Admitting: Family Medicine

## 2018-10-29 ENCOUNTER — Encounter: Payer: Self-pay | Admitting: Family Medicine

## 2018-10-29 VITALS — BP 145/101 | HR 98 | Temp 97.6°F | Ht 61.0 in | Wt 99.4 lb

## 2018-10-29 DIAGNOSIS — W57XXXA Bitten or stung by nonvenomous insect and other nonvenomous arthropods, initial encounter: Secondary | ICD-10-CM | POA: Diagnosis not present

## 2018-10-29 DIAGNOSIS — R21 Rash and other nonspecific skin eruption: Secondary | ICD-10-CM

## 2018-10-29 MED ORDER — HYDROXYZINE HCL 25 MG PO TABS: 25.0000 mg | ORAL_TABLET | Freq: Three times a day (TID) | ORAL | 0 refills | Status: DC | PRN

## 2018-10-29 NOTE — Progress Notes (Signed)
   BP (!) 145/101   Pulse 98   Temp 97.6 F (36.4 C) (Oral)   Ht 5\' 1"  (1.549 m)   Wt 99 lb 6.4 oz (45.1 kg)   LMP 08/09/2013   BMI 18.78 kg/m    Subjective:   Patient ID: [name], female    DOB: 1961/04/19, 58 y.o.   MRN: [id number]  HPI: [name] is a 58 y.o. female presenting on 10/29/2018 for mites (patient was seen 3/13 and states it has goten worse- Patient thinks she has mites)   HPI Worsening rash Patient is coming in today with complaints of worsening rash.  She says that it is been more pruritic and she has been having problems with it more.  She feels like she found some small bugs and brought one in with her although it is difficult to distinguish at this point.  Patient denies any redness or warmth or drainage from any of the sites but they are just very pruritic.  Relevant past medical, surgical, family and social history reviewed and updated as indicated. Interim medical history since our last visit reviewed. Allergies and medications reviewed and updated.    Per HPI unless specifically indicated above  Review of Systems  Constitutional: Negative for chills and fever.  Skin: Positive for rash.      Objective:   BP (!) 145/101   Pulse 98   Temp 97.6 F (36.4 C) (Oral)   Ht 5\' 1"  (1.549 m)   Wt 99 lb 6.4 oz (45.1 kg)   LMP 08/09/2013   BMI 18.78 kg/m   Wt Readings from Last 3 Encounters:  10/29/18 99 lb 6.4 oz (45.1 kg)  10/19/18 99 lb 3.2 oz (45 kg)  09/26/18 99 lb (44.9 kg)    Physical Exam Vitals signs and nursing note reviewed.  Constitutional:      General: She is not in acute distress.    Appearance: She is well-developed. She is not diaphoretic.  Skin:    General: Skin is warm and dry.     Findings: Rash (papular rash with excoriations on forearms and around belt area) present.  Neurological:     Mental Status: She is alert and oriented to person, place, and time.     Coordination: Coordination normal.  Psychiatric:      Behavior: Behavior normal.     Assessment & Plan:   Problem List Items Addressed This Visit    None    Visit Diagnoses    Arthropod bite, initial encounter    -  Primary   Relevant Medications   hydrOXYzine (ATARAX/VISTARIL) 25 MG tablet      Continue triamcinolone and sent in hydroxyzine to help, likely the treatment that she did at her house will help eliminate the arthropods that are causing these issues. Follow up plan: Return if symptoms worsen or fail to improve.  Counseling provided for all of the vaccine components No orders of the defined types were placed in this encounter.   Caryl Pina, MD [facility] 10/29/2018, 8:43 AM

## 2018-10-30 ENCOUNTER — Telehealth: Payer: Self-pay | Admitting: Family Medicine

## 2018-10-30 NOTE — Telephone Encounter (Signed)
Can try benadryl or get lotipns over the counter for itching.

## 2018-10-31 DIAGNOSIS — L859 Epidermal thickening, unspecified: Secondary | ICD-10-CM | POA: Diagnosis not present

## 2018-10-31 DIAGNOSIS — L280 Lichen simplex chronicus: Secondary | ICD-10-CM | POA: Diagnosis not present

## 2018-10-31 DIAGNOSIS — D225 Melanocytic nevi of trunk: Secondary | ICD-10-CM | POA: Diagnosis not present

## 2018-10-31 DIAGNOSIS — D485 Neoplasm of uncertain behavior of skin: Secondary | ICD-10-CM | POA: Diagnosis not present

## 2018-10-31 DIAGNOSIS — L0100 Impetigo, unspecified: Secondary | ICD-10-CM | POA: Diagnosis not present

## 2018-11-01 DIAGNOSIS — Z6820 Body mass index (BMI) 20.0-20.9, adult: Secondary | ICD-10-CM | POA: Diagnosis not present

## 2018-11-01 DIAGNOSIS — N8111 Cystocele, midline: Secondary | ICD-10-CM | POA: Diagnosis not present

## 2018-11-06 DIAGNOSIS — M545 Low back pain: Secondary | ICD-10-CM | POA: Diagnosis not present

## 2018-11-06 DIAGNOSIS — Z79891 Long term (current) use of opiate analgesic: Secondary | ICD-10-CM | POA: Diagnosis not present

## 2018-11-06 DIAGNOSIS — F331 Major depressive disorder, recurrent, moderate: Secondary | ICD-10-CM | POA: Diagnosis not present

## 2018-11-06 DIAGNOSIS — F419 Anxiety disorder, unspecified: Secondary | ICD-10-CM | POA: Diagnosis not present

## 2018-11-09 DIAGNOSIS — M545 Low back pain: Secondary | ICD-10-CM | POA: Diagnosis not present

## 2018-11-09 DIAGNOSIS — M5416 Radiculopathy, lumbar region: Secondary | ICD-10-CM | POA: Diagnosis not present

## 2018-11-20 DIAGNOSIS — L280 Lichen simplex chronicus: Secondary | ICD-10-CM | POA: Diagnosis not present

## 2018-12-04 DIAGNOSIS — Z79891 Long term (current) use of opiate analgesic: Secondary | ICD-10-CM | POA: Diagnosis not present

## 2018-12-04 DIAGNOSIS — M545 Low back pain: Secondary | ICD-10-CM | POA: Diagnosis not present

## 2018-12-04 DIAGNOSIS — F331 Major depressive disorder, recurrent, moderate: Secondary | ICD-10-CM | POA: Diagnosis not present

## 2018-12-04 DIAGNOSIS — F419 Anxiety disorder, unspecified: Secondary | ICD-10-CM | POA: Diagnosis not present

## 2018-12-06 DIAGNOSIS — M545 Low back pain: Secondary | ICD-10-CM | POA: Diagnosis not present

## 2018-12-06 DIAGNOSIS — Z79899 Other long term (current) drug therapy: Secondary | ICD-10-CM | POA: Diagnosis not present

## 2018-12-06 DIAGNOSIS — G8929 Other chronic pain: Secondary | ICD-10-CM | POA: Diagnosis not present

## 2018-12-20 ENCOUNTER — Telehealth: Payer: Self-pay | Admitting: Family Medicine

## 2018-12-20 ENCOUNTER — Ambulatory Visit: Payer: BLUE CROSS/BLUE SHIELD | Admitting: Family Medicine

## 2018-12-20 NOTE — Telephone Encounter (Signed)
The last 3 times that I saw her her blood pressure was elevated and we wanted to do a follow-up to see if it was still running high and if we needed to treat it.  That is why I would like to see her to recheck her blood pressure at some point in the near future because of how high it was.

## 2018-12-20 NOTE — Telephone Encounter (Signed)
Patient state she is going to see a specialist on 12/21/2018 and if is still elevated will set up an appt with Dr. Warrick Parisian to review

## 2018-12-20 NOTE — Telephone Encounter (Signed)
Okay sounds good, we will see her then.

## 2018-12-21 DIAGNOSIS — G8929 Other chronic pain: Secondary | ICD-10-CM | POA: Diagnosis not present

## 2018-12-21 DIAGNOSIS — M5416 Radiculopathy, lumbar region: Secondary | ICD-10-CM | POA: Diagnosis not present

## 2018-12-21 DIAGNOSIS — Z79899 Other long term (current) drug therapy: Secondary | ICD-10-CM | POA: Diagnosis not present

## 2018-12-21 DIAGNOSIS — M5412 Radiculopathy, cervical region: Secondary | ICD-10-CM | POA: Diagnosis not present

## 2018-12-21 DIAGNOSIS — M545 Low back pain: Secondary | ICD-10-CM | POA: Diagnosis not present

## 2019-01-10 DIAGNOSIS — F3342 Major depressive disorder, recurrent, in full remission: Secondary | ICD-10-CM | POA: Diagnosis not present

## 2019-01-10 DIAGNOSIS — F410 Panic disorder [episodic paroxysmal anxiety] without agoraphobia: Secondary | ICD-10-CM | POA: Diagnosis not present

## 2019-01-16 DIAGNOSIS — L720 Epidermal cyst: Secondary | ICD-10-CM | POA: Diagnosis not present

## 2019-01-18 DIAGNOSIS — G8929 Other chronic pain: Secondary | ICD-10-CM | POA: Diagnosis not present

## 2019-01-18 DIAGNOSIS — M5416 Radiculopathy, lumbar region: Secondary | ICD-10-CM | POA: Diagnosis not present

## 2019-01-18 DIAGNOSIS — M545 Low back pain: Secondary | ICD-10-CM | POA: Diagnosis not present

## 2019-01-18 DIAGNOSIS — M5412 Radiculopathy, cervical region: Secondary | ICD-10-CM | POA: Diagnosis not present

## 2019-01-18 DIAGNOSIS — Z79899 Other long term (current) drug therapy: Secondary | ICD-10-CM | POA: Diagnosis not present

## 2019-02-04 ENCOUNTER — Other Ambulatory Visit: Payer: Self-pay

## 2019-02-05 ENCOUNTER — Ambulatory Visit (INDEPENDENT_AMBULATORY_CARE_PROVIDER_SITE_OTHER): Payer: BC Managed Care – PPO | Admitting: Physician Assistant

## 2019-02-05 ENCOUNTER — Encounter: Payer: Self-pay | Admitting: Physician Assistant

## 2019-02-05 ENCOUNTER — Telehealth: Payer: Self-pay | Admitting: *Deleted

## 2019-02-05 ENCOUNTER — Other Ambulatory Visit: Payer: Self-pay

## 2019-02-05 ENCOUNTER — Ambulatory Visit: Payer: BC Managed Care – PPO | Admitting: Physician Assistant

## 2019-02-05 DIAGNOSIS — F1020 Alcohol dependence, uncomplicated: Secondary | ICD-10-CM | POA: Diagnosis not present

## 2019-02-05 MED ORDER — CLONIDINE HCL 0.1 MG PO TABS: ORAL_TABLET | ORAL | 0 refills | Status: DC

## 2019-02-05 MED ORDER — DISULFIRAM 250 MG PO TABS: ORAL_TABLET | ORAL | 1 refills | Status: DC

## 2019-02-05 NOTE — Telephone Encounter (Signed)
Yes, call to have her committed, through the law or magistrates office

## 2019-02-05 NOTE — Telephone Encounter (Signed)
Patients husband states that since patient spoke with you she has done nothing but drink. Patients husband states that he has recordings of her threatening to kill herself. Patient husband states that she drinks so much and she just passes out. Patient husband advised to call and see if he can have her involuntarily committed.
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
## 2019-02-05 NOTE — Telephone Encounter (Signed)
Patient's husband aware °

## 2019-02-06 ENCOUNTER — Ambulatory Visit: Payer: BLUE CROSS/BLUE SHIELD | Admitting: Physician Assistant

## 2019-02-14 ENCOUNTER — Ambulatory Visit: Payer: BLUE CROSS/BLUE SHIELD | Admitting: Family

## 2019-02-15 DIAGNOSIS — G8929 Other chronic pain: Secondary | ICD-10-CM | POA: Diagnosis not present

## 2019-02-15 DIAGNOSIS — Z79899 Other long term (current) drug therapy: Secondary | ICD-10-CM | POA: Diagnosis not present

## 2019-02-15 DIAGNOSIS — M545 Low back pain: Secondary | ICD-10-CM | POA: Diagnosis not present

## 2019-02-15 DIAGNOSIS — M5412 Radiculopathy, cervical region: Secondary | ICD-10-CM | POA: Diagnosis not present

## 2019-02-15 DIAGNOSIS — M5416 Radiculopathy, lumbar region: Secondary | ICD-10-CM | POA: Diagnosis not present

## 2019-02-20 ENCOUNTER — Ambulatory Visit: Payer: BC Managed Care – PPO | Admitting: Physician Assistant

## 2019-02-20 ENCOUNTER — Other Ambulatory Visit: Payer: Self-pay

## 2019-02-20 ENCOUNTER — Encounter: Payer: Self-pay | Admitting: Physician Assistant

## 2019-02-20 VITALS — BP 105/74 | HR 89 | Temp 97.6°F | Ht 61.0 in | Wt 102.0 lb

## 2019-02-20 DIAGNOSIS — F411 Generalized anxiety disorder: Secondary | ICD-10-CM | POA: Diagnosis not present

## 2019-02-20 DIAGNOSIS — F339 Major depressive disorder, recurrent, unspecified: Secondary | ICD-10-CM | POA: Diagnosis not present

## 2019-02-20 DIAGNOSIS — F1020 Alcohol dependence, uncomplicated: Secondary | ICD-10-CM | POA: Diagnosis not present

## 2019-02-20 MED ORDER — TRAZODONE HCL 100 MG PO TABS: 100.0000 mg | ORAL_TABLET | Freq: Every evening | ORAL | 3 refills | Status: AC | PRN

## 2019-02-20 NOTE — Patient Instructions (Signed)
Due to your depression and anxiety, you need to take the medications prescribed to you.  You especially need to take the Lexapro because it is for depression and it can help prevent anxiety.  You also need to take your hydroxyzine because that helps with anxiety.  You also need to continue Lamictal because that helps with mood stabilization.  And your trazodone is something to be taken also.  That also helps depression.  And finally if you were going to stop your alcohol,  the clonidine is for your withdrawal and the Antabuse is for you to continue to take so that you will not want to drink alcohol.  Terald Sleeper PA-C [facility] [address]  [place],  [phone]

## 2019-02-21 ENCOUNTER — Encounter: Payer: Self-pay | Admitting: Physician Assistant
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
## 2019-02-25 ENCOUNTER — Telehealth: Payer: Self-pay | Admitting: Physician Assistant

## 2019-02-25 NOTE — Telephone Encounter (Signed)
Please advise 

## 2019-02-25 NOTE — Telephone Encounter (Signed)
It is okay to do her the work note

## 2019-02-26 ENCOUNTER — Telehealth: Payer: Self-pay | Admitting: Physician Assistant

## 2019-02-26 NOTE — Telephone Encounter (Signed)
Patient aware that letter has been sent to [facility]

## 2019-02-26 NOTE — Telephone Encounter (Signed)
Work note faxed per pt request.

## 2019-02-26 NOTE — Telephone Encounter (Signed)
Okay to do note?

## 2019-02-26 NOTE — Telephone Encounter (Signed)
Pt wants to be out from 7/14 to 7/23. Pt wants to go back to work on 03/01/2019

## 2019-03-05 ENCOUNTER — Other Ambulatory Visit: Payer: Self-pay | Admitting: Family Medicine

## 2019-03-12 ENCOUNTER — Other Ambulatory Visit: Payer: Self-pay | Admitting: Physician Assistant

## 2019-03-12 DIAGNOSIS — F1020 Alcohol dependence, uncomplicated: Secondary | ICD-10-CM

## 2019-03-14 ENCOUNTER — Telehealth: Payer: Self-pay | Admitting: Physician Assistant

## 2019-03-14 NOTE — Telephone Encounter (Signed)
Pt needs to be evaluated.

## 2019-03-15 DIAGNOSIS — Z79899 Other long term (current) drug therapy: Secondary | ICD-10-CM | POA: Diagnosis not present

## 2019-03-15 DIAGNOSIS — Z1159 Encounter for screening for other viral diseases: Secondary | ICD-10-CM | POA: Diagnosis not present

## 2019-03-15 DIAGNOSIS — M5412 Radiculopathy, cervical region: Secondary | ICD-10-CM | POA: Diagnosis not present

## 2019-03-15 DIAGNOSIS — M545 Low back pain: Secondary | ICD-10-CM | POA: Diagnosis not present

## 2019-03-15 DIAGNOSIS — M5416 Radiculopathy, lumbar region: Secondary | ICD-10-CM | POA: Diagnosis not present

## 2019-03-19 NOTE — Telephone Encounter (Signed)
Multiple attempts made to contact patient.  This encounter will now be closed  

## 2019-03-21 ENCOUNTER — Ambulatory Visit: Payer: BC Managed Care – PPO | Admitting: Physician Assistant

## 2019-03-22 ENCOUNTER — Other Ambulatory Visit: Payer: Self-pay | Admitting: Physician Assistant

## 2019-03-22 NOTE — Telephone Encounter (Signed)
Patient aware that she can try vitamins otc or she will need to be seen to discuss with provider.

## 2019-03-27 ENCOUNTER — Encounter: Payer: Self-pay | Admitting: Physician Assistant

## 2019-03-27 ENCOUNTER — Telehealth: Payer: Self-pay | Admitting: Physician Assistant

## 2019-03-27 ENCOUNTER — Ambulatory Visit (INDEPENDENT_AMBULATORY_CARE_PROVIDER_SITE_OTHER): Payer: BC Managed Care – PPO | Admitting: Physician Assistant

## 2019-03-27 ENCOUNTER — Other Ambulatory Visit: Payer: Self-pay

## 2019-03-27 DIAGNOSIS — R42 Dizziness and giddiness: Secondary | ICD-10-CM

## 2019-03-27 MED ORDER — MECLIZINE HCL 25 MG PO TABS: 25.0000 mg | ORAL_TABLET | Freq: Three times a day (TID) | ORAL | 0 refills | Status: AC | PRN

## 2019-03-27 NOTE — Telephone Encounter (Signed)
Patient has a phone visit.

## 2019-03-28 NOTE — Progress Notes (Signed)
Telephone visit  Subjective: DG:LOVFIEPPI PCP: Terald Sleeper, PA-C RJJ:OACZY [name] is a 58 y.o. female calls for telephone consult today. Patient provides verbal consent for consult held via phone.  Patient is identified with 2 separate identifiers.  At this time the entire area is on COVID-19 social distancing and stay home orders are in place.  Patient is of higher risk and therefore we are performing this by a virtual method.  Location of patient: home Location of provider: [facility] Others present for call: no   This patient is been having recurrent episodes seenAnd imbalance.  She states she does feel nausea.  However she does not really vomit.  She denies any fever or chills.  She has not been exposed to [place].  About 1 month ago patient tried to quit alcohol and has done so.  However during this time she has taken and Catapres to reduce her symptoms and has continued to take Antabuse.  I think she has passed the time needing the Catapres.  So we will stop that.  Plus it could be making her have some low blood pressure symptoms and the dizziness.  Hopefully things will get better with just doing this.  We are going to send in some Antivert to be used for dizziness if needed.  The patient has been out of work 7/15 through 7/20, 7/24, 7/23, 8/19 -03/31/19.  An FMLA will be coming to help cover her for this, chronic recurrent condition.  A letter can be sent to the [employer] at her company.   ROS: Per HPI  Allergies  Allergen Reactions  . Amlodipine Swelling    Ankle swelling   . Coreg [Carvedilol] Diarrhea  . Cortisporin [Neomycin-Polymyxin-Hc] Itching and Rash   Past Medical History:  Diagnosis Date  . Alcohol dependence with alcohol-induced mood disorder ([place]) 07/2016  . Chest pain   . Depression   . GERD (gastroesophageal reflux disease)   . HTN (hypertension)    resolved after sleeve  . Seizures ([place])    patient denies    Current Outpatient Medications:  .   BELBUCA 900 MCG FILM, , Disp: , Rfl:  .  Buprenorphine HCl (BELBUCA BU), 150 mg., Disp: , Rfl:  .  BUSPIRONE HCL PO, , Disp: , Rfl:  .  clonazePAM (KLONOPIN) 1 MG tablet, Take 1 mg by mouth 3 (three) times daily as needed for anxiety., Disp: , Rfl:  .  cyclobenzaprine (FLEXERIL) 10 MG tablet, , Disp: , Rfl:  .  CYCLOBENZAPRINE HCL PO, 10 mg., Disp: , Rfl:  .  disulfiram (ANTABUSE) 250 MG tablet, After stopping alcohol for at least 12 hours, take 1 tablet twice daily to prevent use, Disp: 60 tablet, Rfl: 1 .  escitalopram (LEXAPRO) 20 MG tablet, Take 30 mg by mouth daily., Disp: , Rfl:  .  HYDROcodone-acetaminophen (NORCO) 10-325 MG tablet, Take 1 tablet by mouth every 6 (six) hours as needed., Disp: , Rfl:  .  hydrOXYzine (ATARAX/VISTARIL) 25 MG tablet, Take 1 tablet (25 mg total) by mouth 3 (three) times daily as needed for itching., Disp: 30 tablet, Rfl: 0 .  meclizine (ANTIVERT) 25 MG tablet, Take 1 tablet (25 mg total) by mouth 3 (three) times daily as needed for dizziness., Disp: 40 tablet, Rfl: 0 .  omeprazole (PRILOSEC) 20 MG capsule, TAKE 1 CAPSULE BY MOUTH  DAILY, Disp: 90 capsule, Rfl: 0 .  traZODone (DESYREL) 100 MG tablet, Take 1 tablet (100 mg total) by mouth at bedtime  as needed for sleep., Disp: 90 tablet, Rfl: 3  Assessment/ Plan: 58 y.o. female   1. Vertigo - meclizine (ANTIVERT) 25 MG tablet; Take 1 tablet (25 mg total) by mouth 3 (three) times daily as needed for dizziness.  Dispense: 40 tablet; Refill: 0   Return in about 4 weeks (around 04/24/2019).  Continue all other maintenance medications as listed above.  Start time: 2:40 PM End time: 3:05 PM  Meds ordered this encounter  Medications  . meclizine (ANTIVERT) 25 MG tablet    Sig: Take 1 tablet (25 mg total) by mouth 3 (three) times daily as needed for dizziness.    Dispense:  40 tablet    Refill:  0    Order Specific Question:   Supervising Provider    Answer:   [name] [[id number]]    Particia Nearing  PA-C [place] [phone]

## 2019-04-09 DIAGNOSIS — Z79899 Other long term (current) drug therapy: Secondary | ICD-10-CM | POA: Diagnosis not present

## 2019-04-09 DIAGNOSIS — M545 Low back pain: Secondary | ICD-10-CM | POA: Diagnosis not present

## 2019-04-09 DIAGNOSIS — M5412 Radiculopathy, cervical region: Secondary | ICD-10-CM | POA: Diagnosis not present

## 2019-04-09 DIAGNOSIS — M5416 Radiculopathy, lumbar region: Secondary | ICD-10-CM | POA: Diagnosis not present

## 2019-04-09 DIAGNOSIS — G8929 Other chronic pain: Secondary | ICD-10-CM | POA: Diagnosis not present

## 2019-04-22 ENCOUNTER — Other Ambulatory Visit: Payer: Self-pay

## 2019-04-22 ENCOUNTER — Telehealth: Payer: Self-pay | Admitting: Physician Assistant

## 2019-04-23 ENCOUNTER — Ambulatory Visit: Payer: BC Managed Care – PPO | Admitting: Physician Assistant

## 2019-05-10 DIAGNOSIS — Z79899 Other long term (current) drug therapy: Secondary | ICD-10-CM | POA: Diagnosis not present

## 2019-05-10 DIAGNOSIS — M545 Low back pain: Secondary | ICD-10-CM | POA: Diagnosis not present

## 2019-05-10 DIAGNOSIS — G8929 Other chronic pain: Secondary | ICD-10-CM | POA: Diagnosis not present

## 2019-05-10 DIAGNOSIS — M5412 Radiculopathy, cervical region: Secondary | ICD-10-CM | POA: Diagnosis not present

## 2019-05-15 DIAGNOSIS — R609 Edema, unspecified: Secondary | ICD-10-CM | POA: Diagnosis not present

## 2019-05-15 DIAGNOSIS — R52 Pain, unspecified: Secondary | ICD-10-CM | POA: Diagnosis not present

## 2019-05-15 DIAGNOSIS — M25561 Pain in right knee: Secondary | ICD-10-CM | POA: Diagnosis not present

## 2019-05-16 ENCOUNTER — Encounter: Payer: Self-pay | Admitting: Family

## 2019-05-16 ENCOUNTER — Ambulatory Visit: Payer: BC Managed Care – PPO | Admitting: Family

## 2019-05-16 ENCOUNTER — Other Ambulatory Visit: Payer: Self-pay

## 2019-05-16 ENCOUNTER — Other Ambulatory Visit: Payer: Self-pay | Admitting: *Deleted

## 2019-05-16 ENCOUNTER — Ambulatory Visit (INDEPENDENT_AMBULATORY_CARE_PROVIDER_SITE_OTHER): Payer: BC Managed Care – PPO

## 2019-05-16 VITALS — BP 111/82 | HR 99 | Temp 98.4°F | Ht 61.0 in | Wt 102.4 lb

## 2019-05-16 DIAGNOSIS — S0510XA Contusion of eyeball and orbital tissues, unspecified eye, initial encounter: Secondary | ICD-10-CM

## 2019-05-16 DIAGNOSIS — Z23 Encounter for immunization: Secondary | ICD-10-CM | POA: Diagnosis not present

## 2019-05-16 DIAGNOSIS — T7411XA Adult physical abuse, confirmed, initial encounter: Secondary | ICD-10-CM | POA: Diagnosis not present

## 2019-05-16 DIAGNOSIS — S0993XA Unspecified injury of face, initial encounter: Secondary | ICD-10-CM | POA: Diagnosis not present
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
## 2019-05-17 ENCOUNTER — Encounter: Payer: Self-pay | Admitting: Family

## 2019-05-17 ENCOUNTER — Telehealth: Payer: Self-pay | Admitting: Family

## 2019-05-17 ENCOUNTER — Other Ambulatory Visit: Payer: Self-pay | Admitting: Family

## 2019-05-17 ENCOUNTER — Telehealth: Payer: Self-pay | Admitting: Physician Assistant

## 2019-05-17 DIAGNOSIS — G44319 Acute post-traumatic headache, not intractable: Secondary | ICD-10-CM

## 2019-05-17 MED ORDER — CYCLOBENZAPRINE HCL 10 MG PO TABS: 10.0000 mg | ORAL_TABLET | Freq: Three times a day (TID) | ORAL | 0 refills | Status: DC | PRN

## 2019-05-17 NOTE — Telephone Encounter (Signed)
Sent in

## 2019-05-19 NOTE — Telephone Encounter (Signed)
Please get update on her symptoms

## 2019-05-20 ENCOUNTER — Ambulatory Visit: Payer: BC Managed Care – PPO | Admitting: Family Medicine

## 2019-05-20 ENCOUNTER — Ambulatory Visit (INDEPENDENT_AMBULATORY_CARE_PROVIDER_SITE_OTHER): Payer: BC Managed Care – PPO

## 2019-05-20 ENCOUNTER — Other Ambulatory Visit: Payer: Self-pay

## 2019-05-20 ENCOUNTER — Encounter: Payer: Self-pay | Admitting: Family Medicine

## 2019-05-20 VITALS — BP 106/70 | HR 108 | Temp 98.2°F | Ht 61.0 in | Wt 106.6 lb

## 2019-05-20 DIAGNOSIS — F411 Generalized anxiety disorder: Secondary | ICD-10-CM | POA: Diagnosis not present

## 2019-05-20 DIAGNOSIS — M25561 Pain in right knee: Secondary | ICD-10-CM

## 2019-05-20 DIAGNOSIS — R233 Spontaneous ecchymoses: Secondary | ICD-10-CM | POA: Diagnosis not present

## 2019-05-20 DIAGNOSIS — H2513 Age-related nuclear cataract, bilateral: Secondary | ICD-10-CM | POA: Diagnosis not present

## 2019-05-20 DIAGNOSIS — M7989 Other specified soft tissue disorders: Secondary | ICD-10-CM | POA: Diagnosis not present

## 2019-05-20 MED ORDER — OMEPRAZOLE 20 MG PO CPDR: 20.0000 mg | DELAYED_RELEASE_CAPSULE | Freq: Every day | ORAL | 3 refills | Status: DC

## 2019-05-20 MED ORDER — HYDROXYZINE HCL 25 MG PO TABS: 25.0000 mg | ORAL_TABLET | Freq: Three times a day (TID) | ORAL | 0 refills | Status: DC | PRN

## 2019-05-20 NOTE — Addendum Note (Signed)
Addended by: Caryl Pina on: 05/20/2019 02:59 PM   Modules accepted: Orders
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
## 2019-05-20 NOTE — Telephone Encounter (Signed)
Patient states that she doesn't want to do the CT because it won't be back before the court date. She is going to have her eyes checked instead. She states that her right knee is really hurting. She has an appt with Dr Dettinger today for that.

## 2019-05-21 ENCOUNTER — Telehealth: Payer: Self-pay | Admitting: Physician Assistant

## 2019-05-21 NOTE — Telephone Encounter (Signed)
Please disregard message on work note. Patient wants to wait

## 2019-05-21 NOTE — Telephone Encounter (Signed)
Is this ok to change? Please advise

## 2019-05-21 NOTE — Telephone Encounter (Signed)
Work note fax to [phone]

## 2019-05-23 ENCOUNTER — Telehealth: Payer: Self-pay | Admitting: Family Medicine

## 2019-05-24 ENCOUNTER — Encounter: Payer: Self-pay | Admitting: *Deleted

## 2019-05-24 NOTE — Telephone Encounter (Signed)
Ok to extend

## 2019-05-24 NOTE — Telephone Encounter (Signed)
Pt called and aware - note up front

## 2019-05-24 NOTE — Telephone Encounter (Signed)
[name] started this original leave on 05/16/2019, forward to her?

## 2019-05-27 ENCOUNTER — Telehealth: Payer: Self-pay | Admitting: Physician Assistant

## 2019-05-27 NOTE — Telephone Encounter (Signed)
Work note faxed, patient aware.

## 2019-05-29 ENCOUNTER — Telehealth: Payer: Self-pay | Admitting: Physician Assistant

## 2019-05-29 ENCOUNTER — Other Ambulatory Visit: Payer: Self-pay | Admitting: Physician Assistant

## 2019-05-29 MED ORDER — CYCLOBENZAPRINE HCL 10 MG PO TABS: 10.0000 mg | ORAL_TABLET | Freq: Three times a day (TID) | ORAL | 0 refills | Status: AC | PRN

## 2019-05-29 MED ORDER — OMEPRAZOLE 20 MG PO CPDR: 20.0000 mg | DELAYED_RELEASE_CAPSULE | Freq: Every day | ORAL | 3 refills | Status: AC

## 2019-05-29 NOTE — Telephone Encounter (Signed)
sent 

## 2019-05-29 NOTE — Telephone Encounter (Signed)
Was seen for knee pain and requesting medication to help with the pain.  Please advise if scripts will be ordered.

## 2019-06-12 DIAGNOSIS — Z79899 Other long term (current) drug therapy: Secondary | ICD-10-CM | POA: Diagnosis not present

## 2019-06-12 DIAGNOSIS — M5412 Radiculopathy, cervical region: Secondary | ICD-10-CM | POA: Diagnosis not present

## 2019-06-12 DIAGNOSIS — G8929 Other chronic pain: Secondary | ICD-10-CM | POA: Diagnosis not present

## 2019-06-12 DIAGNOSIS — M545 Low back pain: Secondary | ICD-10-CM | POA: Diagnosis not present

## 2019-06-13 DIAGNOSIS — M545 Low back pain: Secondary | ICD-10-CM | POA: Diagnosis not present

## 2019-06-27 DIAGNOSIS — F4310 Post-traumatic stress disorder, unspecified: Secondary | ICD-10-CM | POA: Diagnosis not present

## 2019-06-27 DIAGNOSIS — F331 Major depressive disorder, recurrent, moderate: Secondary | ICD-10-CM | POA: Diagnosis not present

## 2019-07-01 ENCOUNTER — Other Ambulatory Visit: Payer: Self-pay | Admitting: Physician Assistant

## 2019-07-01 DIAGNOSIS — F1020 Alcohol dependence, uncomplicated: Secondary | ICD-10-CM

## 2019-07-11 DIAGNOSIS — M5412 Radiculopathy, cervical region: Secondary | ICD-10-CM | POA: Diagnosis not present

## 2019-07-11 DIAGNOSIS — Z79899 Other long term (current) drug therapy: Secondary | ICD-10-CM | POA: Diagnosis not present

## 2019-07-12 DIAGNOSIS — M545 Low back pain: Secondary | ICD-10-CM | POA: Diagnosis not present

## 2019-07-13 DIAGNOSIS — M545 Low back pain: Secondary | ICD-10-CM | POA: Diagnosis not present

## 2019-08-13 DIAGNOSIS — M5412 Radiculopathy, cervical region: Secondary | ICD-10-CM | POA: Diagnosis not present

## 2019-08-13 DIAGNOSIS — J3089 Other allergic rhinitis: Secondary | ICD-10-CM | POA: Diagnosis not present

## 2019-08-13 DIAGNOSIS — Z79899 Other long term (current) drug therapy: Secondary | ICD-10-CM | POA: Diagnosis not present

## 2019-09-13 DIAGNOSIS — Z79899 Other long term (current) drug therapy: Secondary | ICD-10-CM | POA: Diagnosis not present

## 2019-09-13 DIAGNOSIS — M5412 Radiculopathy, cervical region: Secondary | ICD-10-CM | POA: Diagnosis not present

## 2019-09-13 DIAGNOSIS — J3089 Other allergic rhinitis: Secondary | ICD-10-CM | POA: Diagnosis not present

## 2019-10-11 DIAGNOSIS — Z79899 Other long term (current) drug therapy: Secondary | ICD-10-CM | POA: Diagnosis not present

## 2019-10-11 DIAGNOSIS — M5412 Radiculopathy, cervical region: Secondary | ICD-10-CM | POA: Diagnosis not present

## 2019-11-05 DIAGNOSIS — Z79899 Other long term (current) drug therapy: Secondary | ICD-10-CM | POA: Diagnosis not present

## 2019-11-05 DIAGNOSIS — M5412 Radiculopathy, cervical region: Secondary | ICD-10-CM | POA: Diagnosis not present

## 2019-12-02 DIAGNOSIS — Z79899 Other long term (current) drug therapy: Secondary | ICD-10-CM | POA: Diagnosis not present

## 2019-12-02 DIAGNOSIS — M5412 Radiculopathy, cervical region: Secondary | ICD-10-CM | POA: Diagnosis not present

## 2019-12-02 DIAGNOSIS — F4310 Post-traumatic stress disorder, unspecified: Secondary | ICD-10-CM | POA: Diagnosis not present

## 2019-12-17 DIAGNOSIS — F410 Panic disorder [episodic paroxysmal anxiety] without agoraphobia: Secondary | ICD-10-CM | POA: Diagnosis not present

## 2019-12-17 DIAGNOSIS — F4310 Post-traumatic stress disorder, unspecified: Secondary | ICD-10-CM | POA: Diagnosis not present

## 2019-12-17 DIAGNOSIS — F3342 Major depressive disorder, recurrent, in full remission: Secondary | ICD-10-CM | POA: Diagnosis not present

## 2019-12-17 DIAGNOSIS — F4322 Adjustment disorder with anxiety: Secondary | ICD-10-CM | POA: Diagnosis not present

## 2019-12-31 DIAGNOSIS — H6123 Impacted cerumen, bilateral: Secondary | ICD-10-CM | POA: Diagnosis not present

## 2019-12-31 DIAGNOSIS — Z79899 Other long term (current) drug therapy: Secondary | ICD-10-CM | POA: Diagnosis not present

## 2019-12-31 DIAGNOSIS — M5412 Radiculopathy, cervical region: Secondary | ICD-10-CM | POA: Diagnosis not present

## 2019-12-31 DIAGNOSIS — H9203 Otalgia, bilateral: Secondary | ICD-10-CM | POA: Diagnosis not present

## 2020-01-28 DIAGNOSIS — M5412 Radiculopathy, cervical region: Secondary | ICD-10-CM | POA: Diagnosis not present

## 2020-01-28 DIAGNOSIS — Z79899 Other long term (current) drug therapy: Secondary | ICD-10-CM | POA: Diagnosis not present

## 2020-01-28 DIAGNOSIS — K591 Functional diarrhea: Secondary | ICD-10-CM | POA: Diagnosis not present

## 2020-01-28 DIAGNOSIS — L309 Dermatitis, unspecified: Secondary | ICD-10-CM | POA: Diagnosis not present

## 2020-02-25 DIAGNOSIS — L309 Dermatitis, unspecified: Secondary | ICD-10-CM | POA: Diagnosis not present

## 2020-02-25 DIAGNOSIS — M5412 Radiculopathy, cervical region: Secondary | ICD-10-CM | POA: Diagnosis not present

## 2020-02-25 DIAGNOSIS — K219 Gastro-esophageal reflux disease without esophagitis: Secondary | ICD-10-CM | POA: Diagnosis not present

## 2020-02-25 DIAGNOSIS — Z79899 Other long term (current) drug therapy: Secondary | ICD-10-CM | POA: Diagnosis not present

## 2020-03-23 DIAGNOSIS — Z79899 Other long term (current) drug therapy: Secondary | ICD-10-CM | POA: Diagnosis not present

## 2020-03-23 DIAGNOSIS — L299 Pruritus, unspecified: Secondary | ICD-10-CM | POA: Diagnosis not present

## 2020-03-23 DIAGNOSIS — M5412 Radiculopathy, cervical region: Secondary | ICD-10-CM | POA: Diagnosis not present

## 2020-03-23 DIAGNOSIS — Z20822 Contact with and (suspected) exposure to covid-19: Secondary | ICD-10-CM | POA: Diagnosis not present

## 2020-04-15 DIAGNOSIS — Z6823 Body mass index (BMI) 23.0-23.9, adult: Secondary | ICD-10-CM | POA: Diagnosis not present

## 2020-04-15 DIAGNOSIS — R21 Rash and other nonspecific skin eruption: Secondary | ICD-10-CM | POA: Diagnosis not present

## 2020-04-21 DIAGNOSIS — M5412 Radiculopathy, cervical region: Secondary | ICD-10-CM | POA: Diagnosis not present

## 2020-04-21 DIAGNOSIS — L309 Dermatitis, unspecified: Secondary | ICD-10-CM | POA: Diagnosis not present

## 2020-04-21 DIAGNOSIS — Z79899 Other long term (current) drug therapy: Secondary | ICD-10-CM | POA: Diagnosis not present

## 2020-05-19 DIAGNOSIS — L299 Pruritus, unspecified: Secondary | ICD-10-CM | POA: Diagnosis not present

## 2020-05-19 DIAGNOSIS — Z79899 Other long term (current) drug therapy: Secondary | ICD-10-CM | POA: Diagnosis not present

## 2020-05-19 DIAGNOSIS — F5104 Psychophysiologic insomnia: Secondary | ICD-10-CM | POA: Diagnosis not present

## 2020-05-19 DIAGNOSIS — M5412 Radiculopathy, cervical region: Secondary | ICD-10-CM | POA: Diagnosis not present

## 2020-06-16 DIAGNOSIS — M5412 Radiculopathy, cervical region: Secondary | ICD-10-CM | POA: Diagnosis not present

## 2020-06-16 DIAGNOSIS — Z79899 Other long term (current) drug therapy: Secondary | ICD-10-CM | POA: Diagnosis not present

## 2020-06-16 DIAGNOSIS — Z23 Encounter for immunization: Secondary | ICD-10-CM | POA: Diagnosis not present

## 2020-06-16 DIAGNOSIS — F5104 Psychophysiologic insomnia: Secondary | ICD-10-CM | POA: Diagnosis not present

## 2020-06-16 DIAGNOSIS — L299 Pruritus, unspecified: Secondary | ICD-10-CM | POA: Diagnosis not present

## 2020-07-14 DIAGNOSIS — F5104 Psychophysiologic insomnia: Secondary | ICD-10-CM | POA: Diagnosis not present

## 2020-07-14 DIAGNOSIS — M5412 Radiculopathy, cervical region: Secondary | ICD-10-CM | POA: Diagnosis not present

## 2020-07-14 DIAGNOSIS — Z79899 Other long term (current) drug therapy: Secondary | ICD-10-CM | POA: Diagnosis not present

## 2020-07-14 DIAGNOSIS — H9201 Otalgia, right ear: Secondary | ICD-10-CM | POA: Diagnosis not present

## 2020-07-14 DIAGNOSIS — L299 Pruritus, unspecified: Secondary | ICD-10-CM | POA: Diagnosis not present

## 2020-07-17 DIAGNOSIS — F3342 Major depressive disorder, recurrent, in full remission: Secondary | ICD-10-CM | POA: Diagnosis not present

## 2020-07-17 DIAGNOSIS — F4310 Post-traumatic stress disorder, unspecified: Secondary | ICD-10-CM | POA: Diagnosis not present

## 2020-07-17 DIAGNOSIS — F410 Panic disorder [episodic paroxysmal anxiety] without agoraphobia: Secondary | ICD-10-CM | POA: Diagnosis not present

## 2020-11-17 ENCOUNTER — Encounter: Payer: Self-pay | Admitting: Family

## 2020-11-17 ENCOUNTER — Ambulatory Visit: Payer: BC Managed Care – PPO | Admitting: Family

## 2020-11-17 ENCOUNTER — Ambulatory Visit: Payer: BC Managed Care – PPO

## 2020-11-17 DIAGNOSIS — R21 Rash and other nonspecific skin eruption: Secondary | ICD-10-CM | POA: Diagnosis not present

## 2020-11-17 MED ORDER — HYDROXYZINE HCL 25 MG PO TABS: 25.0000 mg | ORAL_TABLET | Freq: Three times a day (TID) | ORAL | 2 refills | Status: DC | PRN

## 2020-11-17 MED ORDER — CEPHALEXIN 500 MG PO CAPS: 500.0000 mg | ORAL_CAPSULE | Freq: Three times a day (TID) | ORAL | 0 refills | Status: DC

## 2020-11-17 NOTE — Progress Notes (Signed)
   Virtual Visit  Note Due to COVID-19 pandemic this visit was conducted virtually. This visit type was conducted due to national recommendations for restrictions regarding the COVID-19 Pandemic (e.g. social distancing, sheltering in place) in an effort to limit this patient's exposure and mitigate transmission in our community. All issues noted in this document were discussed and addressed.  A physical exam was not performed with this format.  I connected with [name] on 11/17/20 at 2:02 pm  by video  and verified that I am speaking with the correct person using two identifiers. [name] is currently located at friends home and no one is currently with her during visit. The provider, Evelina Dun, FNP is located in their office at time of visit.  I discussed the limitations, risks, security and privacy concerns of performing an evaluation and management service by video and the availability of in person appointments. I also discussed with the patient that there may be a patient responsible charge related to this service. The patient expressed understanding and agreed to proceed.   History and Present Illness:  Rash This is a new problem. The current episode started more than 1 month ago. The problem has been gradually worsening since onset. The affected locations include the back. The rash is characterized by burning, itchiness, pain and redness. She was exposed to nothing. Pertinent negatives include no congestion, eye pain, fever, shortness of breath or sore throat. Past treatments include anti-itch cream. The treatment provided mild relief.      Review of Systems  Constitutional: Negative for fever.  HENT: Negative for congestion and sore throat.   Eyes: Negative for pain.  Respiratory: Negative for shortness of breath.   Skin: Positive for rash.  All other systems reviewed and are negative.    Observations/Objective: Papule rash on size of nickels on back, erythemas and  bloody.   Assessment and Plan: 1. Rash of back Will give Keflex since are is erythemas  Do not scratch Referral to derm - Ambulatory referral to Dermatology     I discussed the assessment and treatment plan with the patient. The patient was provided an opportunity to ask questions and all were answered. The patient agreed with the plan and demonstrated an understanding of the instructions.   The patient was advised to call back or seek an in-person evaluation if the symptoms worsen or if the condition fails to improve as anticipated.  The above assessment and management plan was discussed with the patient. The patient verbalized understanding of and has agreed to the management plan. Patient is aware to call the clinic if symptoms persist or worsen. Patient is aware when to return to the clinic for a follow-up visit. Patient educated on when it is appropriate to go to the emergency department.   Time call ended:  2:17 pm   I provided 15 minutes of   face-to-face time during this encounter.    Evelina Dun, FNP

## 2020-11-18 ENCOUNTER — Telehealth: Payer: Self-pay

## 2020-11-18 NOTE — Telephone Encounter (Signed)
NA to let pt know  

## 2020-11-18 NOTE — Telephone Encounter (Signed)
Pt needs a note faxed to Rondel Jumbo at [phone] for being out of work 11/15/20-11/18/20, she had a televisit with [facility] 4/12

## 2020-11-18 NOTE — Telephone Encounter (Signed)
Note printed and faxed.

## 2021-01-05 ENCOUNTER — Ambulatory Visit: Payer: BC Managed Care – PPO | Admitting: Physician Assistant

## 2021-01-05 ENCOUNTER — Encounter: Payer: Self-pay | Admitting: Physician Assistant

## 2021-01-05 ENCOUNTER — Other Ambulatory Visit: Payer: Self-pay

## 2021-01-05 VITALS — BP 102/75 | HR 89 | Temp 97.8°F | Ht 61.0 in | Wt 112.8 lb

## 2021-01-05 DIAGNOSIS — S61432A Puncture wound without foreign body of left hand, initial encounter: Secondary | ICD-10-CM

## 2021-01-05 DIAGNOSIS — W540XXA Bitten by dog, initial encounter: Secondary | ICD-10-CM

## 2021-01-05 MED ORDER — AMOXICILLIN-POT CLAVULANATE 875-125 MG PO TABS: 1.0000 | ORAL_TABLET | Freq: Two times a day (BID) | ORAL | 0 refills | Status: AC

## 2021-01-05 NOTE — Progress Notes (Signed)
  Subjective:     Patient ID: [name], female   DOB: [birth year], 60 y.o.   MRN: [id number]  HPI Pt with dog bite to the L hand Bite was from her own dog and she confirms dog is up to date on shots Now with redness and pain to the area Denies any drainage form the area No meds for sx Believes her tetanus is up to date  Review of Systems  Constitutional: Negative.   Musculoskeletal:       + pain and swelling to the L hand  Skin: Positive for color change and wound.       Objective:   Physical Exam Vitals and nursing note reviewed.  Constitutional:      General: She is not in acute distress.    Appearance: Normal appearance.  Neurological:     Mental Status: She is alert.   + erythema and edema to the medial L hand/wrist + TTP of same Puncture wound to the palm and medial dorsal L hand She has FROM of wrist and hand but slow and increase in sx No drainage noted Good pulses/sensory     Assessment:     Dog Bite with cellulitis    Plan:     Augmentin rx with food x 10 days F/U appt Fri - sooner if any problems Due to type of work - note out until Monday Tetanus confirmed up to date

## 2021-01-05 NOTE — Patient Instructions (Signed)
Animal Bite, Adult Animal bite wounds can be mild or serious. It is important to get medical treatment to prevent infection. Ask your doctor if you need treatment to prevent an infection that can spread from animals to humans (rabies). Follow these instructions at home: Wound care  Follow instructions from your doctor about how to take care of your wound. Make sure you: ? Wash your hands with soap and water before you change your bandage (dressing). If you cannot use soap and water, use hand sanitizer. ? Change your bandage as told by your doctor. ? Leave stitches (sutures), skin glue, or skin tape (adhesive) strips in place. They may need to stay in place for 2 weeks or longer. If tape strips get loose and curl up, you may trim the loose edges. Do not remove tape strips completely unless your doctor says it is okay.  Check your wound every day for signs of infection. Check for: ? More redness, swelling, or pain. ? More fluid or blood. ? Warmth. ? Pus or a bad smell.   Medicines  Take or apply over-the-counter and prescription medicines only as told by your doctor.  If you were prescribed an antibiotic, take or apply it as told by your doctor. Do not stop using the antibiotic even if your wound gets better. General instructions  Keep the injured area raised (elevated) above the level of your heart while you are sitting or lying down.  If directed, put ice on the injured area. ? Put ice in a plastic bag. ? Place a towel between your skin and the bag. ? Leave the ice on for 20 minutes, 2-3 times per day.  Keep all follow-up visits as told by your doctor. This is important.   Contact a doctor if:  You have more redness, swelling, or pain around your wound.  Your wound feels warm to the touch.  You have a fever or chills.  You have a general feeling of sickness (malaise).  You feel sick to your stomach (nauseous).  You throw up (vomit).  You have pain that does not get  better. Get help right away if:  You have a red streak going away from your wound.  You have any of these coming from your wound: ? Non-clear fluid. ? More blood. ? Pus or a bad smell.  You have trouble moving your injured area.  You lose feeling (have numbness) or feel tingling anywhere on your body. Summary  It is important to get the right medical treatment for animal bites. Treatment can help you to not get an infection. Ask your doctor if you need treatment to prevent an infection that can spread from animals to humans (rabies).  Check your wound every day for signs of infection, such as more redness or swelling instead of less.  If you have a red streak going away from your wound, get medical help right away. This information is not intended to replace advice given to you by your health care provider. Make sure you discuss any questions you have with your health care provider. Document Revised: 07/20/2017 Document Reviewed: 02/02/2017 Elsevier Patient Education  2021 Elsevier Inc.  

## 2021-01-08 ENCOUNTER — Ambulatory Visit: Payer: BC Managed Care – PPO | Admitting: Nurse Practitioner

## 2021-01-08 ENCOUNTER — Other Ambulatory Visit: Payer: Self-pay

## 2021-01-08 ENCOUNTER — Encounter: Payer: Self-pay | Admitting: Nurse Practitioner

## 2021-01-08 VITALS — BP 126/86 | HR 94 | Temp 97.1°F | Ht 61.0 in | Wt 112.0 lb

## 2021-01-08 DIAGNOSIS — S61432D Puncture wound without foreign body of left hand, subsequent encounter: Secondary | ICD-10-CM | POA: Diagnosis not present

## 2021-01-08 DIAGNOSIS — T148XXA Other injury of unspecified body region, initial encounter: Secondary | ICD-10-CM

## 2021-01-08 NOTE — Assessment & Plan Note (Signed)
Wound site is healing as expected advised.  Continue to advise patient on signs and symptoms of infection.  Printed handouts given.

## 2021-01-08 NOTE — Progress Notes (Signed)
Acute Office Visit  Subjective:    Patient ID: [name], female    DOB: Dec 11, 1960, 60 y.o.   MRN: [id number]  Chief Complaint  Patient presents with  . Animal Bite    Animal Bite  The incident occurred at home. There is an injury to the left hand. The pain is moderate. It is unlikely that a foreign body is present. Pertinent negatives include no numbness and no headaches. There have been no prior injuries to these areas. She is right-handed. Her tetanus status is UTD. She has been behaving normally. There were no sick contacts. Recently, medical care has been given by the PCP. Services received include medications given.  Patient is following up for dog bite after completing antibiotic.  Reporting improved symptoms no signs or symptoms of infection today.   Past Medical History:  Diagnosis Date  . Alcohol dependence with alcohol-induced mood disorder ([place]) 07/2016  . Chest pain   . Depression   . GERD (gastroesophageal reflux disease)   . HTN (hypertension)    resolved after sleeve  . Seizures ([place])    patient denies    Past Surgical History:  Procedure Laterality Date  . ABDOMINAL HYSTERECTOMY    . BACK SURGERY    . FOOT SURGERY Right 9/10   right foot-bunion-hammertoe  . Lapidus fusion Left 08/02/12  . METATARSAL OSTEOTOMY WITH BUNIONECTOMY Left 08/02/12   [facility]  . sleeve gastrectomy  03/2014   [facility]. Starting weight 192  . TUBAL LIGATION    . Tubes in ears      Family History  Problem Relation Age of Onset  . Hypertension Mother   . Cancer Mother        kidney  . Diabetes Father   . Hypertension Father   . Colon cancer Neg Hx   . Colon polyps Neg Hx     Social History   Socioeconomic History  . Marital status: Married    Spouse name: Not on file  . Number of children: Not on file  . Years of education: Not on file  . Highest education level: Not on file  Occupational History  . Not on file  Tobacco Use  . Smoking status: Never  Smoker  . Smokeless tobacco: Never Used  Vaping Use  . Vaping Use: Never used  Substance and Sexual Activity  . Alcohol use: Yes    Alcohol/week: 3.0 standard drinks    Types: 3 Glasses of wine per week    Comment: daily  . Drug use: No  . Sexual activity: Not Currently    Birth control/protection: None  Other Topics Concern  . Not on file  Social History Narrative  . Not on file   Social Determinants of Health   Financial Resource Strain: Not on file  Food Insecurity: Not on file  Transportation Needs: Not on file  Physical Activity: Not on file  Stress: Not on file  Social Connections: Not on file  Intimate Partner Violence: Not on file    Outpatient Medications Prior to Visit  Medication Sig Dispense Refill  . amoxicillin-clavulanate (AUGMENTIN) 875-125 MG tablet Take 1 tablet by mouth 2 (two) times daily. 20 tablet 0  . BELBUCA 900 MCG FILM     . clonazePAM (KLONOPIN) 1 MG tablet Take 1 mg by mouth 3 (three) times daily as needed for anxiety.    . cyclobenzaprine (FLEXERIL) 10 MG tablet Take 1 tablet (10 mg total) by mouth 3 (three) times daily as  needed for muscle spasms. 30 tablet 0  . escitalopram (LEXAPRO) 20 MG tablet Take 30 mg by mouth daily.    . meclizine (ANTIVERT) 25 MG tablet Take 1 tablet (25 mg total) by mouth 3 (three) times daily as needed for dizziness. 40 tablet 0  . NARCAN 4 MG/0.1ML LIQD nasal spray kit     . omeprazole (PRILOSEC) 20 MG capsule Take 1 capsule (20 mg total) by mouth daily. 90 capsule 3  . oxyCODONE-acetaminophen (PERCOCET) 10-325 MG tablet     . traZODone (DESYREL) 100 MG tablet Take 1 tablet (100 mg total) by mouth at bedtime as needed for sleep. 90 tablet 3  . cephALEXin (KEFLEX) 500 MG capsule Take 1 capsule (500 mg total) by mouth 3 (three) times daily. 30 capsule 0   No facility-administered medications prior to visit.    Allergies  Allergen Reactions  . Amlodipine Swelling    Ankle swelling   . Coreg [Carvedilol] Diarrhea   . Cortisporin [Neomycin-Polymyxin-Hc] Itching and Rash    Review of Systems  Constitutional: Negative.   HENT: Negative.   Respiratory: Negative.   Skin: Positive for color change and wound.  Neurological: Negative for numbness and headaches.  All other systems reviewed and are negative.      Objective:    Physical Exam Vitals and nursing note reviewed.  Constitutional:      General: She is sleeping.     Appearance: Normal appearance.     Interventions: Face mask in place.  HENT:     Head: Normocephalic.     Nose: Nose normal.  Cardiovascular:     Rate and Rhythm: Normal rate and regular rhythm.     Pulses: Normal pulses.     Heart sounds: Normal heart sounds.  Pulmonary:     Effort: Pulmonary effort is normal.     Breath sounds: Normal breath sounds.  Abdominal:     General: Bowel sounds are normal.  Skin:    Findings: Bruising, erythema and laceration present.     BP 126/86   Pulse 94   Temp (!) 97.1 F (36.2 C) (Temporal)   Ht _0  (1.549 m)   Wt 112 lb (50.8 kg)   LMP 08/09/2013   SpO2 96%   BMI 21.16 kg/m  Wt Readings from Last 3 Encounters:  01/08/21 112 lb (50.8 kg)  01/05/21 112 lb 12.8 oz (51.2 kg)  05/20/19 106 lb 9.6 oz (48.4 kg)    Health Maintenance Due  Topic Date Due  . Pneumococcal Vaccine 64-54 Years old (1 of 2 - PPSV23) Never done  . Zoster Vaccines- Shingrix (1 of 2) Never done  . COLON CANCER SCREENING ANNUAL FOBT  11/13/2013  . PAP SMEAR-Modifier  11/14/2015  . COLONOSCOPY (Pts 45-26yr Insurance coverage will need to be confirmed)  11/13/2017  . MAMMOGRAM  01/20/2019    There are no preventive care reminders to display for this patient.   Lab Results  Component Value Date   TSH 1.87 05/10/2017   Lab Results  Component Value Date   WBC 14.8 (H) 09/24/2018   HGB 14.0 09/24/2018   HCT 41.1 09/24/2018   MCV 90 09/24/2018   PLT 231 09/24/2018   Lab Results  Component Value Date   NA 138 09/24/2018   K 3.8 09/24/2018    CO2 23 09/24/2018   GLUCOSE 97 09/24/2018   BUN 18 09/24/2018   CREATININE 0.69 09/24/2018   BILITOT 0.7 09/24/2018   ALKPHOS 65 09/24/2018   AST  24 09/24/2018   ALT 12 09/24/2018   PROT 6.7 09/24/2018   ALBUMIN 4.4 09/24/2018   CALCIUM 8.9 09/24/2018   ANIONGAP 7 02/14/2018   Lab Results  Component Value Date   CHOL 239 (H) 11/13/2012   Lab Results  Component Value Date   HDL 107 11/13/2012   Lab Results  Component Value Date   LDLCALC 111 (H) 11/13/2012   Lab Results  Component Value Date   TRIG 106 11/13/2012   No results found for: [facility] Lab Results  Component Value Date   HGBA1C 4.9 11/13/2015       Assessment & Plan:   Problem List Items Addressed This Visit      Other   Animal bite - Primary    Wound site is healing as expected advised.  Continue to advise patient on signs and symptoms of infection.  Printed handouts given.          No orders of the defined types were placed in this encounter.    Ivy Lynn, NP

## 2021-01-08 NOTE — Patient Instructions (Signed)
Animal Bite, Adult Animal bite wounds can be mild or serious. It is important to get medical treatment to prevent infection. Ask your doctor if you need treatment to prevent an infection that can spread from animals to humans (rabies). Follow these instructions at home: Wound care  Follow instructions from your doctor about how to take care of your wound. Make sure you: ? Wash your hands with soap and water before you change your bandage (dressing). If you cannot use soap and water, use hand sanitizer. ? Change your bandage as told by your doctor. ? Leave stitches (sutures), skin glue, or skin tape (adhesive) strips in place. They may need to stay in place for 2 weeks or longer. If tape strips get loose and curl up, you may trim the loose edges. Do not remove tape strips completely unless your doctor says it is okay.  Check your wound every day for signs of infection. Check for: ? More redness, swelling, or pain. ? More fluid or blood. ? Warmth. ? Pus or a bad smell.   Medicines  Take or apply over-the-counter and prescription medicines only as told by your doctor.  If you were prescribed an antibiotic, take or apply it as told by your doctor. Do not stop using the antibiotic even if your wound gets better. General instructions  Keep the injured area raised (elevated) above the level of your heart while you are sitting or lying down.  If directed, put ice on the injured area. ? Put ice in a plastic bag. ? Place a towel between your skin and the bag. ? Leave the ice on for 20 minutes, 2-3 times per day.  Keep all follow-up visits as told by your doctor. This is important.   Contact a doctor if:  You have more redness, swelling, or pain around your wound.  Your wound feels warm to the touch.  You have a fever or chills.  You have a general feeling of sickness (malaise).  You feel sick to your stomach (nauseous).  You throw up (vomit).  You have pain that does not get  better. Get help right away if:  You have a red streak going away from your wound.  You have any of these coming from your wound: ? Non-clear fluid. ? More blood. ? Pus or a bad smell.  You have trouble moving your injured area.  You lose feeling (have numbness) or feel tingling anywhere on your body. Summary  It is important to get the right medical treatment for animal bites. Treatment can help you to not get an infection. Ask your doctor if you need treatment to prevent an infection that can spread from animals to humans (rabies).  Check your wound every day for signs of infection, such as more redness or swelling instead of less.  If you have a red streak going away from your wound, get medical help right away. This information is not intended to replace advice given to you by your health care provider. Make sure you discuss any questions you have with your health care provider. Document Revised: 07/20/2017 Document Reviewed: 02/02/2017 Elsevier Patient Education  2021 Elsevier Inc.  

## 2021-01-14 ENCOUNTER — Telehealth: Payer: Self-pay | Admitting: Nurse Practitioner

## 2021-01-14 NOTE — Telephone Encounter (Signed)
Patient aware that paperwork was received

## 2021-01-18 ENCOUNTER — Encounter: Payer: Self-pay | Admitting: Family Medicine

## 2021-01-18 ENCOUNTER — Ambulatory Visit: Payer: BC Managed Care – PPO | Admitting: Family Medicine

## 2021-01-18 ENCOUNTER — Other Ambulatory Visit: Payer: Self-pay

## 2021-01-18 VITALS — BP 106/70 | HR 102 | Temp 99.3°F | Ht 61.0 in | Wt 113.2 lb

## 2021-01-18 DIAGNOSIS — W540XXD Bitten by dog, subsequent encounter: Secondary | ICD-10-CM | POA: Diagnosis not present

## 2021-01-18 DIAGNOSIS — S61432D Puncture wound without foreign body of left hand, subsequent encounter: Secondary | ICD-10-CM

## 2021-01-18 NOTE — Patient Instructions (Signed)
Animal Bite, Adult Animal bites range from mild to serious. An animal bite can result in any of these injuries: A scratch. A deep, open cut. A puncture of the skin. A crush injury. Tearing away of the skin or a body part. A bone injury. A small bite from a house pet is usually less serious than a bite from a stray or wild animal, such as a raccoon, fox, skunk, or bat. That is because stray and wild animals have a higher risk of carrying a serious infection calledrabies, which can be passed to humans through a bite. What increases the risk? You are more likely to be bitten by an animal if: You are around unfamiliar pets. You disturb an animal when it is eating, sleeping, or caring for its babies. You are outdoors in a place where small, wild animals roam freely. What are the signs or symptoms? Common symptoms of an animal bite include: Pain. Bleeding. Swelling. Bruising. How is this diagnosed? This condition may be diagnosed based on a physical exam and medical history. Your health care provider will examine your wound and ask for details about the animal and how the bite happened. You may also have tests, such as: Blood tests to check for infection. X-rays to check for damage to bones or joints. Taking a fluid sample from your wound and checking it for infection (culture test). How is this treated? Treatment varies depending on the type of animal, where the bite is on your body, and your medical history. Treatment may include: Caring for the wound. This often includes cleaning the wound, rinsing out (flushing) the wound with saline solution, and applying a bandage (dressing). In some cases, the wound may be closed with stitches (sutures), staples, skin glue, or adhesive strips. Antibiotic medicine to prevent or treat infection. This medicine may be prescribed in pill or ointment form. If the bite area becomes infected, the medicine may be given through an IV. A tetanus shot to prevent  tetanus infection. Rabies treatment to prevent rabies infection. This will be done if the animal could have rabies. Surgery. This may be done if a bite gets infected or if there is damage that needs to be repaired. Follow these instructions at home: Wound care  Follow instructions from your health care provider about how to take care of your wound. Make sure you: Wash your hands with soap and water before you change your dressing. If soap and water are not available, use hand sanitizer. Change your dressing as told by your health care provider. Leave sutures, skin glue, or adhesive strips in place. These skin closures may need to stay in place for 2 weeks or longer. If adhesive strip edges start to loosen and curl up, you may trim the loose edges. Do not remove adhesive strips completely unless your health care provider tells you to do that. Check your wound every day for signs of infection. Check for: More redness, swelling, or pain. More fluid or blood. Warmth. Pus or a bad smell.  Medicines Take or apply over-the-counter and prescription medicines only as told by your health care provider. If you were prescribed an antibiotic, take or apply it as told by your health care provider. Do not stop using the antibiotic even if your condition improves. General instructions  Keep the injured area raised (elevated) above the level of your heart while you are sitting or lying down, if this is possible. If directed, put ice on the injured area. Put ice in a plastic  bag. Place a towel between your skin and the bag. Leave the ice on for 20 minutes, 2-3 times per day. Keep all follow-up visits as told by your health care provider. This is important.  Contact a health care provider if: You have more redness, swelling, or pain around your wound. Your wound feels warm to the touch. You have a fever or chills. You have a general feeling of sickness (malaise). You feel nauseous or you vomit. You  have pain that does not get better. Get help right away if: You have a red streak that leads away from your wound. You have non-clear fluid or more blood coming from your wound. There is pus or a bad smell coming from your wound. You have trouble moving your injured area. You have numbness or tingling that extends beyond the wound. Summary Animal bites can range from mild to serious. An animal bite can cause a scratch on the skin, a deep open cut, a puncture of the skin, a crush injury, tearing away of the skin or a body part, or a bone injury. Your health care provider will examine your wound and ask for details about the animal and how the bite happened. You may also have tests such as a blood test, X-ray, or testing of a fluid sample from your wound (culture test). Treatment may include wound care, antibiotic medicine, a tetanus shot, and rabies treatment if the animal could have rabies. This information is not intended to replace advice given to you by your health care provider. Make sure you discuss any questions you have with your healthcare provider. Document Revised: 05/19/2020 Document Reviewed: 05/19/2020 Elsevier Patient Education  2022 [employer].

## 2021-01-18 NOTE — Progress Notes (Signed)
Acute Office Visit  Subjective:    Patient ID: [name], female    DOB: 07/02/1961, 60 y.o.   MRN: [id number]  Chief Complaint  Patient presents with   Animal Bite    HPI Patient is in today for follow up of a dog bite. She was bitten by her dog and treated with augmentin on 5/31 for cellulitis. She reports that she has a few days of her augmentin left. She reports that her hand is still very sore. 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 She is concerned about going back to work at this point due to the pain. Denies fever or drainage.   Past Medical History:  Diagnosis Date   Alcohol dependence with alcohol-induced mood disorder ([place]) 07/2016   Chest pain    Depression    GERD (gastroesophageal reflux disease)    HTN (hypertension)    resolved after sleeve   Seizures ([place])    patient denies    Past Surgical History:  Procedure Laterality Date   ABDOMINAL HYSTERECTOMY     BACK SURGERY     FOOT SURGERY Right 9/10   right foot-bunion-hammertoe   Lapidus fusion Left 08/02/12   METATARSAL OSTEOTOMY WITH BUNIONECTOMY Left 08/02/12   [facility]   sleeve gastrectomy  03/2014   [facility]. Starting weight 192   TUBAL LIGATION     Tubes in ears      Family History  Problem Relation Age of Onset   Hypertension Mother    Cancer Mother        kidney   Diabetes Father    Hypertension Father    Colon cancer Neg Hx    Colon polyps Neg Hx     Social History   Socioeconomic History   Marital status: Married    Spouse name: Not on file   Number of children: Not on file   Years of education: Not on file   Highest education level: Not on file  Occupational History   Not on file  Tobacco Use   Smoking status: Never   Smokeless tobacco: Never  Vaping Use   Vaping Use: Never used  Substance and Sexual Activity   Alcohol use: Yes    Alcohol/week: 3.0 standard drinks    Types: 3 Glasses of wine per week    Comment:  daily   Drug use: No   Sexual activity: Not Currently    Birth control/protection: None  Other Topics Concern   Not on file  Social History Narrative   Not on file   Social Determinants of Health   Financial Resource Strain: Not on file  Food Insecurity: Not on file  Transportation Needs: Not on file  Physical Activity: Not on file  Stress: Not on file  Social Connections: Not on file  Intimate Partner Violence: Not on file    Outpatient Medications Prior to Visit  Medication Sig Dispense Refill   amoxicillin-clavulanate (AUGMENTIN) 875-125 MG tablet Take 1 tablet by mouth 2 (two) times daily. 20 tablet 0   BELBUCA 900 MCG FILM      clonazePAM (KLONOPIN) 1 MG tablet Take 1 mg by mouth 3 (three) times daily as needed for anxiety.     cyclobenzaprine (FLEXERIL) 10 MG tablet Take 1 tablet (10 mg total) by mouth 3 (three) times daily as needed for muscle spasms. 30 tablet 0   escitalopram (LEXAPRO) 20 MG tablet Take 30 mg  by mouth daily.     meclizine (ANTIVERT) 25 MG tablet Take 1 tablet (25 mg total) by mouth 3 (three) times daily as needed for dizziness. 40 tablet 0   omeprazole (PRILOSEC) 20 MG capsule Take 1 capsule (20 mg total) by mouth daily. 90 capsule 3   oxyCODONE-acetaminophen (PERCOCET) 10-325 MG tablet      traZODone (DESYREL) 100 MG tablet Take 1 tablet (100 mg total) by mouth at bedtime as needed for sleep. 90 tablet 3   NARCAN 4 MG/0.1ML LIQD nasal spray kit  (Patient not taking: Reported on 01/18/2021)     No facility-administered medications prior to visit.    Allergies  Allergen Reactions   Amlodipine Swelling    Ankle swelling    Coreg [Carvedilol] Diarrhea   Cortisporin [Neomycin-Polymyxin-Hc] Itching and Rash    Review of Systems As per HPI.     Objective:    Physical Exam Vitals and nursing note reviewed.  Constitutional:      General: She is not in acute distress.    Appearance: She is not ill-appearing, toxic-appearing or diaphoretic.   Musculoskeletal:        General: Swelling present.     Comments: Well healing puncture wound to left palmar aspect of hand. No signs of infection. Tenderness to palpation. Decreased grip strength. Sensation infect. Good pulse and capillary refill.   Neurological:     Mental Status: She is alert and oriented to person, place, and time.  Psychiatric:        Mood and Affect: Mood normal.        Behavior: Behavior normal.    BP 106/70   Pulse (!) 102   Temp 99.3 F (37.4 C) (Oral)   Ht 5' 1"  (1.549 m)   Wt 113 lb 4 oz (51.4 kg)   LMP 08/09/2013   BMI 21.40 kg/m  Wt Readings from Last 3 Encounters:  01/18/21 113 lb 4 oz (51.4 kg)  01/08/21 112 lb (50.8 kg)  01/05/21 112 lb 12.8 oz (51.2 kg)    Health Maintenance Due  Topic Date Due   Pneumococcal Vaccine 48-41 Years old (1 - PCV) Never done   Zoster Vaccines- Shingrix (1 of 2) Never done   COLON CANCER SCREENING ANNUAL FOBT  11/13/2013   PAP SMEAR-Modifier  11/14/2015   COLONOSCOPY (Pts 45-17yr Insurance coverage will need to be confirmed)  11/13/2017   MAMMOGRAM  01/20/2019    There are no preventive care reminders to display for this patient.   Lab Results  Component Value Date   TSH 1.87 05/10/2017   Lab Results  Component Value Date   WBC 14.8 (H) 09/24/2018   HGB 14.0 09/24/2018   HCT 41.1 09/24/2018   MCV 90 09/24/2018   PLT 231 09/24/2018   Lab Results  Component Value Date   NA 138 09/24/2018   K 3.8 09/24/2018   CO2 23 09/24/2018   GLUCOSE 97 09/24/2018   BUN 18 09/24/2018   CREATININE 0.69 09/24/2018   BILITOT 0.7 09/24/2018   ALKPHOS 65 09/24/2018   AST 24 09/24/2018   ALT 12 09/24/2018   PROT 6.7 09/24/2018   ALBUMIN 4.4 09/24/2018   CALCIUM 8.9 09/24/2018   ANIONGAP 7 02/14/2018   Lab Results  Component Value Date   CHOL 239 (H) 11/13/2012   Lab Results  Component Value Date   HDL 107 11/13/2012   Lab Results  Component Value Date   LDLCALC 111 (H) 11/13/2012   Lab Results  Component Value Date   TRIG 106 11/13/2012   No results found for: [facility] Lab Results  Component Value Date   HGBA1C 4.9 11/13/2015       Assessment & Plan:   [name] was seen today for animal bite.  Diagnoses and all orders for this visit:  Dog bite, subsequent encounter Treated with doxycycline for cellulitis. Wound healing well, no signs of infection. Still reports soreness. Work note provided. Follow up in 1 week.   The patient indicates understanding of these issues and agrees with the plan.   Gwenlyn Perking, FNP

## 2021-01-25 ENCOUNTER — Ambulatory Visit: Payer: BC Managed Care – PPO | Admitting: Nurse Practitioner

## 2021-01-25 ENCOUNTER — Encounter: Payer: Self-pay | Admitting: Family Medicine

## 2021-01-29 ENCOUNTER — Telehealth: Payer: Self-pay | Admitting: Family Medicine

## 2021-01-29 NOTE — Telephone Encounter (Signed)
Pt called stating that she received a "no show" letter in the mail from a visit that she had missed on 01/25/21.. Pt says no one ever told her about this appt being scheduled, so she didn't know she was supposed to come back. Pt also says that provider wrote her a work note stating that she could go back to work on 01/25/21.   Pt doesn't feel like the "no show" should count against her for these reasons.  Please advise and call patient.

## 2021-01-29 NOTE — Telephone Encounter (Signed)
Tiffany had requested a 1 week follow up  Nurse made this appt while pt was still here that day 6/13.  The 6/20 was on the AWV from the 6/13 OV.  LM for pt to call back to [facility] if she wanted to discuss. -jhb 6/24.

## 2021-02-03 NOTE — Telephone Encounter (Signed)
Pt was called back and this was discussed. The nurse had specific memory about pulling the pt back into a room after appt was over and making the 1 week appt - there was even conversations about the provider she would be seeing. When I asked the patient about this - she did remember the convo about that provider. She was also again made aware that the AVS handed to her had the appt date on it, as well as our office makes reminder calls and does covid screens. She is aware that this occurrence will not be removed from her file as a no-show and the no-show policy was reviewed with her.

## 2021-06-11 ENCOUNTER — Ambulatory Visit: Payer: BC Managed Care – PPO | Admitting: Podiatry

## 2021-10-01 ENCOUNTER — Ambulatory Visit: Payer: BC Managed Care – PPO | Admitting: Family Medicine

## 2021-10-05 ENCOUNTER — Ambulatory Visit: Payer: BC Managed Care – PPO | Admitting: Family Medicine

## 2021-10-06 ENCOUNTER — Ambulatory Visit: Payer: BC Managed Care – PPO | Admitting: Nurse Practitioner

## 2022-09-30 ENCOUNTER — Encounter: Payer: Self-pay | Admitting: Family Medicine

## 2023-11-15 LAB — HM MAMMOGRAPHY
